# Patient Record
Sex: Female | Born: 1980 | Race: Black or African American | Hispanic: No | Marital: Married | Smoking: Never smoker
Health system: Southern US, Community
[De-identification: ages and names within clinical notes are randomized; demographics above are authoritative.]

## PROBLEM LIST (undated history)

## (undated) DIAGNOSIS — I10 Essential (primary) hypertension: Secondary | ICD-10-CM

## (undated) DIAGNOSIS — E119 Type 2 diabetes mellitus without complications: Secondary | ICD-10-CM

---

## 2010-04-21 ENCOUNTER — Emergency Department: Payer: Self-pay | Admitting: Unknown Physician Specialty

## 2010-10-25 ENCOUNTER — Emergency Department: Payer: Self-pay | Admitting: Emergency Medicine

## 2010-11-02 ENCOUNTER — Emergency Department: Payer: Self-pay | Admitting: Emergency Medicine

## 2011-05-10 ENCOUNTER — Emergency Department: Payer: Self-pay | Admitting: Emergency Medicine

## 2011-12-13 ENCOUNTER — Emergency Department: Payer: Self-pay | Admitting: Emergency Medicine

## 2011-12-13 LAB — CBC
HCT: 44 % (ref 35.0–47.0)
HGB: 14.3 g/dL (ref 12.0–16.0)
MCH: 28 pg (ref 26.0–34.0)
MCHC: 32.5 g/dL (ref 32.0–36.0)
MCV: 86 fL (ref 80–100)
RDW: 12.8 % (ref 11.5–14.5)

## 2011-12-13 LAB — URINALYSIS, COMPLETE
Bacteria: NEGATIVE
Glucose,UR: NEGATIVE mg/dL (ref 0–75)
Ketone: NEGATIVE
Leukocyte Esterase: NEGATIVE
Nitrite: NEGATIVE
Ph: 5 (ref 4.5–8.0)
Protein: NEGATIVE
RBC,UR: NONE SEEN /HPF (ref 0–5)

## 2011-12-13 LAB — ETHANOL: Ethanol %: 0.04 % (ref 0.000–0.080)

## 2011-12-13 LAB — COMPREHENSIVE METABOLIC PANEL
Alkaline Phosphatase: 60 U/L (ref 50–136)
Anion Gap: 11 (ref 7–16)
BUN: 7 mg/dL (ref 7–18)
Bilirubin,Total: 0.7 mg/dL (ref 0.2–1.0)
EGFR (Non-African Amer.): >60
Glucose: 78 mg/dL (ref 65–99)
Osmolality: 276 (ref 275–301)
Potassium: 3.5 mmol/L (ref 3.5–5.1)
SGPT (ALT): 22 U/L
Total Protein: 8.5 g/dL — ABNORMAL HIGH (ref 6.4–8.2)

## 2011-12-13 LAB — PREGNANCY, URINE: Pregnancy Test, Urine: NEGATIVE m[IU]/mL

## 2012-09-14 ENCOUNTER — Ambulatory Visit: Payer: Self-pay | Admitting: Obstetrics and Gynecology

## 2014-08-09 DIAGNOSIS — M222X2 Patellofemoral disorders, left knee: Secondary | ICD-10-CM

## 2014-08-09 DIAGNOSIS — M222X1 Patellofemoral disorders, right knee: Secondary | ICD-10-CM | POA: Insufficient documentation

## 2015-03-09 ENCOUNTER — Other Ambulatory Visit: Payer: Self-pay | Admitting: Physician Assistant

## 2015-03-09 DIAGNOSIS — R51 Headache: Principal | ICD-10-CM

## 2015-03-09 DIAGNOSIS — H539 Unspecified visual disturbance: Secondary | ICD-10-CM

## 2015-03-09 DIAGNOSIS — R519 Headache, unspecified: Secondary | ICD-10-CM

## 2015-03-14 ENCOUNTER — Ambulatory Visit: Payer: Self-pay

## 2015-12-12 ENCOUNTER — Encounter: Payer: Self-pay | Admitting: Physician Assistant

## 2015-12-12 ENCOUNTER — Ambulatory Visit: Payer: Self-pay | Admitting: Physician Assistant

## 2015-12-12 VITALS — BP 120/70 | HR 76 | Temp 98.5°F

## 2015-12-12 DIAGNOSIS — A084 Viral intestinal infection, unspecified: Secondary | ICD-10-CM

## 2015-12-12 NOTE — Progress Notes (Signed)
S:  Pt c/o nausea and diarrhea, sx for 3 days, some  fever/chills, no abd pain except for cramping with diarrhea; denies cp/sob, denies camping, bad food, recent antibiotics, or exposure to bad water, multiple episodes of diarrhea each day, now her son has same sx Remainder ros neg  O:  Vitals wnl, nad, ENT wnl, neck supple no lymph, lungs c t a, cv rrr, abd soft nontender bs increased lower quads b/l, neuro intact  A:  Viral gastroenteritis  P:  Reassurance, fluids, brat diet, immodium ad for diarrhea if needed, return if not better in 3 days, return earlier if worsening

## 2017-09-11 ENCOUNTER — Ambulatory Visit: Payer: BLUE CROSS/BLUE SHIELD | Admitting: Family Medicine

## 2017-09-11 ENCOUNTER — Other Ambulatory Visit: Payer: Self-pay | Admitting: Family Medicine

## 2017-09-11 ENCOUNTER — Encounter: Payer: Self-pay | Admitting: Family Medicine

## 2017-09-11 VITALS — BP 140/96 | HR 88 | Temp 98.2°F | Resp 18 | Ht 62.0 in | Wt 200.8 lb

## 2017-09-11 DIAGNOSIS — Z13220 Encounter for screening for lipoid disorders: Secondary | ICD-10-CM

## 2017-09-11 DIAGNOSIS — F320 Major depressive disorder, single episode, mild: Secondary | ICD-10-CM | POA: Diagnosis not present

## 2017-09-11 DIAGNOSIS — R0683 Snoring: Secondary | ICD-10-CM

## 2017-09-11 DIAGNOSIS — Z113 Encounter for screening for infections with a predominantly sexual mode of transmission: Secondary | ICD-10-CM

## 2017-09-11 DIAGNOSIS — F5104 Psychophysiologic insomnia: Secondary | ICD-10-CM | POA: Diagnosis not present

## 2017-09-11 DIAGNOSIS — Z131 Encounter for screening for diabetes mellitus: Secondary | ICD-10-CM

## 2017-09-11 DIAGNOSIS — N912 Amenorrhea, unspecified: Secondary | ICD-10-CM | POA: Diagnosis not present

## 2017-09-11 DIAGNOSIS — R51 Headache: Secondary | ICD-10-CM

## 2017-09-11 DIAGNOSIS — F411 Generalized anxiety disorder: Secondary | ICD-10-CM

## 2017-09-11 DIAGNOSIS — R0981 Nasal congestion: Secondary | ICD-10-CM

## 2017-09-11 DIAGNOSIS — R030 Elevated blood-pressure reading, without diagnosis of hypertension: Secondary | ICD-10-CM | POA: Diagnosis not present

## 2017-09-11 DIAGNOSIS — R519 Headache, unspecified: Secondary | ICD-10-CM

## 2017-09-11 DIAGNOSIS — R439 Unspecified disturbances of smell and taste: Secondary | ICD-10-CM

## 2017-09-11 DIAGNOSIS — Z832 Family history of diseases of the blood and blood-forming organs and certain disorders involving the immune mechanism: Secondary | ICD-10-CM

## 2017-09-11 LAB — POCT URINE PREGNANCY: Preg Test, Ur: NEGATIVE

## 2017-09-11 MED ORDER — ESCITALOPRAM OXALATE 10 MG PO TABS: 10.0000 mg | ORAL_TABLET | Freq: Every day | ORAL | 0 refills | Status: DC

## 2017-09-11 NOTE — Progress Notes (Signed)
Name: Bethany [name]   MRN: [id number]    DOB: 16-Nov-1980   Date:09/11/2017       Progress Note  Subjective  Chief Complaint  Chief Complaint  Patient presents with  . Establish Care    Would like blood work done and has been fasting today. Has been smelling cigarette smoke when no one smokes around her.  . Extremity Weakness    Left Hand-Couple of months and will drop items  . Leg Pain    Right Leg Pain for a couple months when standing all day and sitting for prolong periods of time.  Marland Kitchen. Headache    Onset-2 months, Right side of head has a history of alopecia.   . Snoring    Patient was told she snores in her sleep and SOB since she has gained weight.  . Amenorrhea    Has not has a cycle in 4 months and all pregnancy test have came back negative.  . Stress    HPI  Snoring: she states she snores at night, has interrupted sleep and feels tired during the day. She does not have time to take naps. She also has mental fogginess and headaches  Headache: going on for past 3 months, nagging, aching, left side of head, no nausea , vomiting or photophobia. She has also noticed a distorted smell. States she can always smells cigarette smell, even when not around it. Going on for months ago. No rhinorrhea, has nasal congestion at times. Not exactly sure if symptoms started with onset of headache or not  Obesity: she has noticed weight gain  Depression/Anxiety: [place]. She moved wit her husband and two children to protect them from their environment in [place] ( violence/drugs). She states husband was incarcerated for robbery back in 2014. He is from [place] and is no a legal resident. She is worried that he will be deported. She is now a single mom and is having problems with her daughter. She goes to church but not feel very close to anyone. She does not feel like she can move back to [place] - for children's safety. She has crying spells, disrupted sleep,  feels tired all the time. Denies suicidal thoughts or ideation  Amenorrhea: she has gained weight, she had one sexual intercourse over the past 6 months and no cycle in the past 4 months, she states no vaginal discharge, no nipple discharge. Positive for headaches. Home pregnancy test negative  We did not discuss other problems because the visit went too long, she will return to address left hand symptoms.  Patient Active Problem List   Diagnosis Date Noted  . 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 Patellofemoral stress syndrome of both knees 08/09/2014    Past Surgical History:  Procedure Laterality Date  . CESAREAN SECTION  2002  . CESAREAN SECTION  2005    Family History  Problem Relation Age of Onset  . Emphysema Mother   . Hypercalcemia Mother   . Hypertension Mother   . Sickle cell trait Mother   . Clotting disorder Mother   . Prostate cancer Father   . Diabetes Mellitus II Father   . Hypertension Father   . Heart Problems Father        Enlarged Heart  . Thyroid disease Sister   . Asthma Paternal Grandmother   . Lung cancer Maternal Uncle   . Lung cancer Maternal Aunt   . Kidney disease Paternal Aunt  Social History   Socioeconomic History  . Marital status: Married    Spouse name: Casimiro Needle   . Number of children: 2  . Years of education: Not on file  . Highest education level: Some college, no degree  Social Needs  . Financial resource strain: Somewhat hard  . Food insecurity - worry: Never true  . Food insecurity - inability: Never true  . Transportation needs - medical: No  . Transportation needs - non-medical: No  Occupational History  . Occupation: food service    Comment: at [employer]  . Smoking status: Never Smoker  . Smokeless tobacco: Never Used  Substance and Sexual Activity  . Alcohol use: Yes    Alcohol/week: 0.0 oz    Comment: occasionally  . Drug use: No  . Sexual activity: Yes    Partners: Male    Birth  control/protection: None  Other Topics Concern  . Not on file  Social History Narrative   Married, has two children at home, her relatives live [place]   Husband incarcerated since 2014 for robbery, he is from [place] and may be deported next year when he gets out.    Works at [employer]    She moved to [place] 2011 for a safer place to raise her children     No current outpatient medications on file.  No Known Allergies   ROS  Constitutional: Negative for fever or weight change.  Respiratory: Negative for cough and shortness of breath.   Cardiovascular: Negative for chest pain or palpitations.  Gastrointestinal: Negative for abdominal pain, no bowel changes.  Musculoskeletal: Negative for gait problem or joint swelling.  Skin: Negative for rash.  Neurological: Negative for dizziness, positive for  headache.  No other specific complaints in a complete review of systems (except as listed in HPI above).  Objective  Vitals:   09/11/17 1036 09/11/17 1113  BP: (!) 158/96 (!) 140/96  Pulse: 88   Resp: 18   Temp: 98.2 F (36.8 C)   TempSrc: Oral   SpO2: 99%   Weight: 200 lb 12.8 oz (91.1 kg)   Height: 5\' 2"  (1.575 m)     Body mass index is 36.73 kg/m.  Physical Exam  Constitutional: Patient appears well-developed and well-nourished. Obese  No distress.  HEENT: head atraumatic, normocephalic, pupils equal and reactive to light, ears normal TM bilaterally, no nystagmus, neck supple, throat within normal limits Cardiovascular: Normal rate, regular rhythm and normal heart sounds.  No murmur heard. No BLE edema. Pulmonary/Chest: Effort normal and breath sounds normal. No respiratory distress. Abdominal: Soft.  There is no tenderness. Psychiatric: Patient has a normal mood and affect. behavior is normal. Judgment and thought content normal. Neurological exam: normal , no focal findings.    PHQ2/9: Depression screen PHQ 2/9 09/11/2017  Decreased Interest 1  Down, Depressed,  Hopeless 1  PHQ - 2 Score 2  Altered sleeping 2  Tired, decreased energy 3  Change in appetite 3  Feeling bad or failure about yourself  1  Trouble concentrating 2  Moving slowly or fidgety/restless 0  Suicidal thoughts 0  PHQ-9 Score 13  Difficult doing work/chores Somewhat difficult     Fall Risk: Fall Risk  09/11/2017  Falls in the past year? No     Functional Status Survey: Is the patient deaf or have difficulty hearing?: No Does the patient have difficulty seeing, even when wearing glasses/contacts?: No Does the patient have difficulty concentrating, remembering, or making decisions?: No  Does the patient have difficulty walking or climbing stairs?: No Does the patient have difficulty dressing or bathing?: No Does the patient have difficulty doing errands alone such as visiting a doctor's office or shopping?: No    Assessment & Plan  1. Elevated blood pressure reading  She will return in a couple of weeks, first visit in our office, she was nervous, states usually normal bp  - COMPLETE METABOLIC PANEL WITH GFR - CBC with Differential/Platelet - TSH  2. Psychophysiological insomnia  We will start by treating depression anxiety, also discussed sleep hygiene  3. Mild major depression (HCC)  Discussed counseling and she will consider it, currently taking her daughter to counseling. Explained that she needs to care for herself also. Be more involved in church ( make friends) ,seems lonely and no family support in this area.  - escitalopram (LEXAPRO) 10 MG tablet; Take 1 tablet (10 mg total) by mouth daily.  Dispense: 30 tablet; Refill: 0  4. GAD (generalized anxiety disorder)  - escitalopram (LEXAPRO) 10 MG tablet; Take 1 tablet (10 mg total) by mouth daily.  Dispense: 30 tablet; Refill: 0  5. Routine screening for STI (sexually transmitted infection)  - HIV antibody - RPR - Hepatitis, Acute - C. trachomatis/N. gonorrhoeae RNA  6. Family history of sickle cell  trait  - Sickle Cell Scr  7. Diabetes mellitus screening  - Hemoglobin A1c  8. Lipid screening  - Lipid panel  9. Amenorrhea  - POCT urine pregnancy - Prolactin  10. Snoring  Referral sleep study   11. Nasal congestion  - fluticasone (FLONASE) 50 MCG/ACT nasal spray; Place 2 sprays into both nostrils daily.  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 Left-sided headache  We will refer her to ENT first, may need CT brain or referral to neurologist if symptoms not secondary to ENT cause. Normal neurological exam today

## 2017-09-12 LAB — C. TRACHOMATIS/N. GONORRHOEAE RNA
C. TRACHOMATIS RNA, TMA: NOT DETECTED
N. GONORRHOEAE RNA, TMA: NOT DETECTED

## 2017-09-13 MED ORDER — FLUTICASONE PROPIONATE 50 MCG/ACT NA SUSP
2.0000 | Freq: Every day | NASAL | 6 refills | Status: DC
Start: 2017-09-13 — End: 2019-01-19

## 2017-09-14 LAB — COMPLETE METABOLIC PANEL WITH GFR
AG RATIO: 1.4 (calc) (ref 1.0–2.5)
ALT: 18 U/L (ref 6–29)
AST: 18 U/L (ref 10–30)
Albumin: 4.2 g/dL (ref 3.6–5.1)
Alkaline phosphatase (APISO): 52 U/L (ref 33–115)
BUN: 9 mg/dL (ref 7–25)
CO2: 26 mmol/L (ref 20–32)
Calcium: 9.3 mg/dL (ref 8.6–10.2)
Chloride: 107 mmol/L (ref 98–110)
Creat: 0.85 mg/dL (ref 0.50–1.10)
GFR, Est African American: 101 mL/min/{1.73_m2} (ref >=60)
GFR, Est Non African American: 87 mL/min/{1.73_m2} (ref >=60)
GLUCOSE: 89 mg/dL (ref 65–99)
Globulin: 3 g/dL (calc) (ref 1.9–3.7)
POTASSIUM: 3.9 mmol/L (ref 3.5–5.3)
Sodium: 138 mmol/L (ref 135–146)
Total Bilirubin: 0.5 mg/dL (ref 0.2–1.2)
Total Protein: 7.2 g/dL (ref 6.1–8.1)

## 2017-09-14 LAB — CBC WITH DIFFERENTIAL/PLATELET
BASOS PCT: 0.5 %
Basophils Absolute: 19 cells/uL (ref 0–200)
Eosinophils Absolute: 70 cells/uL (ref 15–500)
Eosinophils Relative: 1.9 %
HEMATOCRIT: 39.9 % (ref 35.0–45.0)
HEMOGLOBIN: 13.5 g/dL (ref 11.7–15.5)
LYMPHS ABS: 1635 {cells}/uL (ref 850–3900)
MCH: 27.2 pg (ref 27.0–33.0)
MCHC: 33.8 g/dL (ref 32.0–36.0)
MCV: 80.4 fL (ref 80.0–100.0)
MPV: 10.1 fL (ref 7.5–12.5)
Monocytes Relative: 7.5 %
NEUTROS ABS: 1698 {cells}/uL (ref 1500–7800)
Neutrophils Relative %: 45.9 %
Platelets: 235 10*3/uL (ref 140–400)
RBC: 4.96 10*6/uL (ref 3.80–5.10)
RDW: 13 % (ref 11.0–15.0)
TOTAL LYMPHOCYTE: 44.2 %
WBC: 3.7 10*3/uL — ABNORMAL LOW (ref 3.8–10.8)
WBCMIX: 278 {cells}/uL (ref 200–950)

## 2017-09-14 LAB — HEPATITIS PANEL, ACUTE
Hep A IgM: NONREACTIVE
Hep B C IgM: NONREACTIVE
Hepatitis B Surface Ag: NONREACTIVE
Hepatitis C Ab: NONREACTIVE
SIGNAL TO CUT-OFF: 0.03 (ref <1.00)

## 2017-09-14 LAB — SICKLE CELL SCREEN: Sickle Solubility Test - HGBRFX: NEGATIVE

## 2017-09-14 LAB — PROLACTIN: PROLACTIN: 10.4 ng/mL

## 2017-09-14 LAB — LIPID PANEL
CHOLESTEROL: 151 mg/dL (ref <200)
HDL: 60 mg/dL (ref >50)
LDL CHOLESTEROL (CALC): 73 mg/dL
Non-HDL Cholesterol (Calc): 91 mg/dL (calc) (ref <130)
Total CHOL/HDL Ratio: 2.5 (calc) (ref <5.0)
Triglycerides: 98 mg/dL (ref <150)

## 2017-09-14 LAB — RPR: RPR Ser Ql: NONREACTIVE

## 2017-09-14 LAB — HEMOGLOBIN A1C
HEMOGLOBIN A1C: 5.7 %{Hb} — AB (ref <5.7)
Mean Plasma Glucose: 117 (calc)
eAG (mmol/L): 6.5 (calc)

## 2017-09-14 LAB — TSH: TSH: 1.47 mIU/L

## 2017-09-14 LAB — HIV ANTIBODY (ROUTINE TESTING W REFLEX): HIV 1&2 Ab, 4th Generation: NONREACTIVE

## 2017-09-17 ENCOUNTER — Telehealth: Payer: Self-pay

## 2017-09-17 NOTE — Telephone Encounter (Signed)
Patient notified we need to wait until she sees [facility].

## 2017-09-17 NOTE — Telephone Encounter (Signed)
Did you discuss patient having a CT while in the office? There is no order and wanted to inquire does she need one?

## 2017-09-17 NOTE — Telephone Encounter (Signed)
Copied from CRM [phone]. Topic: Referral - Question >> Sep 16, 2017  4:42 PM Jolayne Hainesaylor, Brittany L wrote: Patient wants to know will she be getting the CT scan & also can you see if she can get that and the ENT on the same day. The ENT appt is on feb 22.  Patients call back is [phone]

## 2017-09-17 NOTE — Telephone Encounter (Signed)
She needs to see them first, they may order MRI instead of CT

## 2017-10-02 ENCOUNTER — Other Ambulatory Visit: Payer: Self-pay | Admitting: Otolaryngology

## 2017-10-02 DIAGNOSIS — R519 Headache, unspecified: Secondary | ICD-10-CM

## 2017-10-02 DIAGNOSIS — J343 Hypertrophy of nasal turbinates: Secondary | ICD-10-CM | POA: Diagnosis not present

## 2017-10-02 DIAGNOSIS — J301 Allergic rhinitis due to pollen: Secondary | ICD-10-CM | POA: Diagnosis not present

## 2017-10-02 DIAGNOSIS — R51 Headache: Secondary | ICD-10-CM

## 2017-10-02 DIAGNOSIS — R438 Other disturbances of smell and taste: Secondary | ICD-10-CM

## 2017-10-02 DIAGNOSIS — R430 Anosmia: Secondary | ICD-10-CM | POA: Diagnosis not present

## 2017-10-05 ENCOUNTER — Ambulatory Visit: Payer: BLUE CROSS/BLUE SHIELD | Admitting: Family Medicine

## 2017-10-05 ENCOUNTER — Encounter: Payer: Self-pay | Admitting: Family Medicine

## 2017-10-05 DIAGNOSIS — Z23 Encounter for immunization: Secondary | ICD-10-CM | POA: Diagnosis not present

## 2017-10-05 DIAGNOSIS — R439 Unspecified disturbances of smell and taste: Secondary | ICD-10-CM | POA: Diagnosis not present

## 2017-10-05 DIAGNOSIS — E669 Obesity, unspecified: Secondary | ICD-10-CM | POA: Diagnosis not present

## 2017-10-05 DIAGNOSIS — R0981 Nasal congestion: Secondary | ICD-10-CM | POA: Diagnosis not present

## 2017-10-05 DIAGNOSIS — N912 Amenorrhea, unspecified: Secondary | ICD-10-CM

## 2017-10-05 DIAGNOSIS — R51 Headache: Secondary | ICD-10-CM | POA: Diagnosis not present

## 2017-10-05 DIAGNOSIS — R7303 Prediabetes: Secondary | ICD-10-CM

## 2017-10-05 DIAGNOSIS — M545 Low back pain, unspecified: Secondary | ICD-10-CM

## 2017-10-05 DIAGNOSIS — F411 Generalized anxiety disorder: Secondary | ICD-10-CM

## 2017-10-05 DIAGNOSIS — F320 Major depressive disorder, single episode, mild: Secondary | ICD-10-CM

## 2017-10-05 DIAGNOSIS — R519 Headache, unspecified: Secondary | ICD-10-CM

## 2017-10-05 LAB — POCT URINALYSIS DIPSTICK
Appearance: NORMAL
BILIRUBIN UA: NEGATIVE
Blood, UA: NEGATIVE
GLUCOSE UA: NEGATIVE
KETONES UA: NEGATIVE
Leukocytes, UA: NEGATIVE
Nitrite, UA: NEGATIVE
PROTEIN UA: NEGATIVE
SPEC GRAV UA: 1.015 (ref 1.010–1.025)
Urobilinogen, UA: NEGATIVE E.U./dL — AB
pH, Initial: 6

## 2017-10-05 MED ORDER — BACLOFEN 10 MG PO TABS
10.0000 mg | ORAL_TABLET | Freq: Every day | ORAL | 0 refills | Status: DC
Start: 2017-10-05 — End: 2019-01-19

## 2017-10-05 NOTE — Progress Notes (Signed)
Name: Bethany [name]   MRN: [id number]    DOB: 1980-10-08   Date:10/05/2017       Progress Note  Subjective  Chief Complaint  Chief Complaint  Patient presents with  . Follow-up    3 week F/U  . Depression    Started Lexapro last visit  . Hypertension  . Back Pain    lower back pain x 2 days intermittent when she coughs or laughs.    HPI  Depression/GAD: she filled rx but had to go home to see mother [facility]) and forgot to start taking it. Her mother was just released from hospital ( emphysema) and is shipping the medication to her. She continues to feel stressed, and overwhelmed. She states she stopped taking son to [facility] for music practice weekly, she has not been able to take care of herself yet, but is trying to get more sleep ( a total of 6 hours per night). She states crying spells are less often. Only cries when her kids cry.   Elevated BP: resolved since last visit.   Headache : resolved  Nasal congestion and smell disorder: seen by ENT and is having MRI done, she is now on singulair and Astelin also. Seen by Dr. Willeen Cass   Back pain: she had an URI last week and she now has a pain on left mid back when she coughs or laughs.   Amenorrhea: normal TSH, prolactin. She would like to see gyn  Pre-diabetes: she denies polyphagia, polyuria or polydipsia , hgbA1C 5.7. She would like to see dietician. Discussed GLP-1 agonist - she denies personal history of pancreatitis or family history of thyroid cancer.    Patient Active Problem List   Diagnosis Date Noted  . 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 Patellofemoral stress syndrome of both knees 08/09/2014    Past Surgical History:  Procedure Laterality Date  . CESAREAN SECTION  2002  . CESAREAN SECTION  2005    Family History  Problem Relation Age of Onset  . Emphysema Mother   . Hypercalcemia Mother   . Hypertension Mother   . Sickle cell trait Mother   . Clotting disorder Mother   . Prostate cancer Father    . Diabetes Mellitus II Father   . Hypertension Father   . Heart Problems Father        Enlarged Heart  . Thyroid disease Sister   . Asthma Paternal Grandmother   . Lung cancer Maternal Uncle   . Lung cancer Maternal Aunt   . Kidney disease Paternal Aunt     Social History   Socioeconomic History  . Marital status: Married    Spouse name: Casimiro Needle   . Number of children: 2  . Years of education: Not on file  . Highest education level: Some college, no degree  Social Needs  . Financial resource strain: Somewhat hard  . Food insecurity - worry: Never true  . Food insecurity - inability: Never true  . Transportation needs - medical: No  . Transportation needs - non-medical: No  Occupational History  . Occupation: food service    Comment: at [employer]  . Smoking status: Never Smoker  . Smokeless tobacco: Never Used  Substance and Sexual Activity  . Alcohol use: Yes    Alcohol/week: 0.0 oz    Comment: occasionally  . Drug use: No  . Sexual activity: Yes    Partners: Male    Birth control/protection: None  Other Topics Concern  .  Not on file  Social History Narrative   Married, has two children at home, her relatives live [place]   Husband incarcerated since 2014 for robbery, he is from [place] and may be deported next year when he gets out.    Works at [employer]    She moved to [place] 2011 for a safer place to raise her children      Current Outpatient Medications:  .  azelastine (ASTELIN) 0.1 % nasal spray, , Disp: , Rfl: 0 .  fluticasone (FLONASE) 50 MCG/ACT nasal spray, Place 2 sprays into both nostrils daily., Disp: 16 g, Rfl: 6 .  montelukast (SINGULAIR) 10 MG tablet, , Disp: , Rfl: 0 .  escitalopram (LEXAPRO) 10 MG tablet, Take 1 tablet (10 mg total) by mouth daily. (Patient not taking: Reported on 10/05/2017), Disp: 30 tablet, Rfl: 0  No Known Allergies   ROS  Constitutional: Negative for fever or weight change.  Respiratory: Negative  for cough and shortness of breath.   Cardiovascular: Negative for chest pain or palpitations.  Gastrointestinal: Negative for abdominal pain, no bowel changes.  Musculoskeletal: Negative for gait problem or joint swelling.  Skin: Negative for rash.  Neurological: Negative for dizziness or headache.  No other specific complaints in a complete review of systems (except as listed in HPI above).  Objective  Vitals:   10/05/17 1352  BP: 106/80  Pulse: 83  Resp: 14  SpO2: 99%  Weight: 201 lb (91.2 kg)  Height: 5\' 2"  (1.575 m)    Body mass index is 36.76 kg/m.  Physical Exam  Constitutional: Patient appears well-developed and well-nourished. Obese  No distress.  HEENT: head atraumatic, normocephalic, pupils equal and reactive to light, neck supple, throat within normal limits, boggy turbinates Cardiovascular: Normal rate, regular rhythm and normal heart sounds.  No murmur heard. No BLE edema. Pulmonary/Chest: Effort normal and breath sounds normal. No respiratory distress. Abdominal: Soft.  There is no tenderness. Psychiatric: Patient has a normal mood and affect. behavior is normal. Judgment and thought content normal.   Recent Results (from the past 2160 hour(s))  COMPLETE METABOLIC PANEL WITH GFR     Status: None   Collection Time: 09/11/17 11:56 AM  Result Value Ref Range   Glucose, Bld 89 65 - 99 mg/dL    Comment: .            Fasting reference interval .    BUN 9 7 - 25 mg/dL   Creat 1.61 0.96 - 0.45 mg/dL   GFR, Est Non African American 87 > OR = 60 mL/min/1.3m2   GFR, Est African American 101 > OR = 60 mL/min/1.65m2   BUN/Creatinine Ratio NOT APPLICABLE 6 - 22 (calc)   Sodium 138 135 - 146 mmol/L   Potassium 3.9 3.5 - 5.3 mmol/L   Chloride 107 98 - 110 mmol/L   CO2 26 20 - 32 mmol/L   Calcium 9.3 8.6 - 10.2 mg/dL   Total Protein 7.2 6.1 - 8.1 g/dL   Albumin 4.2 3.6 - 5.1 g/dL   Globulin 3.0 1.9 - 3.7 g/dL (calc)   AG Ratio 1.4 1.0 - 2.5 (calc)   Total  Bilirubin 0.5 0.2 - 1.2 mg/dL   Alkaline phosphatase (APISO) 52 33 - 115 U/L   AST 18 10 - 30 U/L   ALT 18 6 - 29 U/L  Hemoglobin A1c     Status: Abnormal   Collection Time: 09/11/17 11:56 AM  Result Value Ref Range   Hgb A1c MFr Bld  5.7 (H) <5.7 % of total Hgb    Comment: For someone without known diabetes, a hemoglobin  A1c value between 5.7% and 6.4% is consistent with prediabetes and should be confirmed with a  follow-up test. . For someone with known diabetes, a value <7% indicates that their diabetes is well controlled. A1c targets should be individualized based on duration of diabetes, age, comorbid conditions, and other considerations. . This assay result is consistent with an increased risk of diabetes. . Currently, no consensus exists regarding use of hemoglobin A1c for diagnosis of diabetes for children. .    Mean Plasma Glucose 117 (calc)   eAG (mmol/L) 6.5 (calc)  HIV antibody     Status: None   Collection Time: 09/11/17 11:56 AM  Result Value Ref Range   HIV 1&2 Ab, 4th Generation NON-REACTIVE NON-REACTI    Comment: HIV-1 antigen and HIV-1/HIV-2 antibodies were not detected. There is no laboratory evidence of HIV infection. Marland Kitchen PLEASE NOTE: This information has been disclosed to you from records whose confidentiality may be protected by state law.  If your state requires such protection, then the state law prohibits you from making any further disclosure of the information without the specific written consent of the person to whom it pertains, or as otherwise permitted by law. A general authorization for the release of medical or other information is NOT sufficient for this purpose. . For additional information please refer to http://education.[facility].com/faq/FAQ106 (This link is being provided for informational/ educational purposes only.) . Marland Kitchen The performance of this assay has not been clinically validated in patients less than 2 years  old. .   Lipid panel     Status: None   Collection Time: 09/11/17 11:56 AM  Result Value Ref Range   Cholesterol 151 <200 mg/dL   HDL 60 >16 mg/dL   Triglycerides 98 <109 mg/dL   LDL Cholesterol (Calc) 73 mg/dL (calc)    Comment: Reference range: <100 . Desirable range <100 mg/dL for primary prevention;   <70 mg/dL for patients with CHD or diabetic patients  with > or = 2 CHD risk factors. Marland Kitchen LDL-C is now calculated using the Martin-Hopkins  calculation, which is a validated novel method providing  better accuracy than the Friedewald equation in the  estimation of LDL-C.  Horald Pollen et al. Lenox Ahr. 6045;409(81): 2061-2068  (http://education.[facility].com/faq/FAQ164)    Total CHOL/HDL Ratio 2.5 <5.0 (calc)   Non-HDL Cholesterol (Calc) 91 <191 mg/dL (calc)    Comment: For patients with diabetes plus 1 major ASCVD risk  factor, treating to a non-HDL-C goal of <100 mg/dL  (LDL-C of <47 mg/dL) is considered a therapeutic  option.   CBC with Differential/Platelet     Status: Abnormal   Collection Time: 09/11/17 11:56 AM  Result Value Ref Range   WBC 3.7 (L) 3.8 - 10.8 Thousand/uL   RBC 4.96 3.80 - 5.10 Million/uL   Hemoglobin 13.5 11.7 - 15.5 g/dL   HCT 82.9 56.2 - 13.0 %   MCV 80.4 80.0 - 100.0 fL   MCH 27.2 27.0 - 33.0 pg   MCHC 33.8 32.0 - 36.0 g/dL   RDW 86.5 78.4 - 69.6 %   Platelets 235 140 - 400 Thousand/uL   MPV 10.1 7.5 - 12.5 fL   Neutro Abs 1,698 1,500 - 7,800 cells/uL   Lymphs Abs 1,635 850 - 3,900 cells/uL   WBC mixed population 278 200 - 950 cells/uL   Eosinophils Absolute 70 15 - 500 cells/uL   Basophils Absolute 19 0 -  200 cells/uL   Neutrophils Relative % 45.9 %   Total Lymphocyte 44.2 %   Monocytes Relative 7.5 %   Eosinophils Relative 1.9 %   Basophils Relative 0.5 %  RPR     Status: None   Collection Time: 09/11/17 11:56 AM  Result Value Ref Range   RPR Ser Ql NON-REACTIVE NON-REACTI  Hepatitis, Acute     Status: None   Collection Time:  09/11/17 11:56 AM  Result Value Ref Range   Hep A IgM NON-REACTIVE NON-REACTI   Hepatitis B Surface Ag NON-REACTIVE NON-REACTI   Hep B C IgM NON-REACTIVE NON-REACTI   Hepatitis C Ab NON-REACTIVE NON-REACTI   SIGNAL TO CUT-OFF 0.03 <1.00  Sickle Cell Scr     Status: None   Collection Time: 09/11/17 11:56 AM  Result Value Ref Range   Sickle Solubility Test - HGBRFX NEGATIVE NEGATIVE    Comment: . Hemoglobin solubility testing alone is insufficient for detecting or confirming the presence of sickling hemoglobins in some situations. Additional testing may be required for diagnosis of hemoglobinopathies. For more information on this test go to: http://education.[facility].com/faq/FAQ99v1 .   TSH     Status: None   Collection Time: 09/11/17 11:56 AM  Result Value Ref Range   TSH 1.47 mIU/L    Comment:           Reference Range .           > or = 20 Years  0.40-4.50 .                Pregnancy Ranges           First trimester    0.26-2.66           Second trimester   0.55-2.73           Third trimester    0.43-2.91   Prolactin     Status: None   Collection Time: 09/11/17 11:56 AM  Result Value Ref Range   Prolactin 10.4 ng/mL    Comment:             Reference Range  Females         Non-pregnant        3.0-30.0         Pregnant           10.0-209.0         Postmenopausal      2.0-20.0 . . .   POCT urine pregnancy     Status: Normal   Collection Time: 09/11/17 11:57 AM  Result Value Ref Range   Preg Test, Ur Negative Negative  C. trachomatis/N. gonorrhoeae RNA     Status: None   Collection Time: 09/11/17  2:36 PM  Result Value Ref Range   C. trachomatis RNA, TMA NOT DETECTED NOT DETECT   N. gonorrhoeae RNA, TMA NOT DETECTED NOT DETECT    Comment: This test was performed using the APTIMA COMBO2 Assay ([employer] . The analytical performance characteristics of this  assay, when used to test SurePath specimens have been determined by [facility]. .        PHQ2/9: Depression screen [facility] 10/05/2017 09/11/2017  Decreased Interest 1 1  Down, Depressed, Hopeless 1 1  PHQ - 2 Score 2 2  Altered sleeping 3 2  Tired, decreased energy 3 3  Change in appetite 3 3  Feeling bad or failure about yourself  2 1  Trouble concentrating 2 2  Moving slowly or fidgety/restless 0 0  Suicidal thoughts 0 0  PHQ-9 Score 15 13  Difficult doing work/chores Somewhat difficult Somewhat difficult     Fall Risk: Fall Risk  10/05/2017 09/11/2017  Falls in the past year? No No     Functional Status Survey: Is the patient deaf or have difficulty hearing?: No Does the patient have difficulty seeing, even when wearing glasses/contacts?: No Does the patient have difficulty concentrating, remembering, or making decisions?: No Does the patient have difficulty walking or climbing stairs?: No Does the patient have difficulty dressing or bathing?: No Does the patient have difficulty doing errands alone such as visiting a doctor's office or shopping?: No    Assessment & Plan  1. Acute midline low back pain without sciatica  - POCT Urinalysis Dipstick Likely muscle spasm , she states she had a cough, only when she laughs and coughs. Discussed muscle relaxer.   2. GAD (generalized anxiety disorder)  Still anxious, left medication at mother's house and is getting shipped back to her, she never started medication but is wiling to try   3. Need for Tdap vaccination  - Tdap today   4. Nasal congestion  On Astelin and Flonase, also on singulair and seeing ENT   5. Mild major depression (HCC)  She has been   6. Disorders of smell  Seeing ENT and was given Astelin and will have MRI brain because she failed the smell test.   7. Left-sided headache  Resolved since last visit   8. Amenorrhea  - Ambulatory referral to Obstetrics / Gynecology  9. Pre-diabetes  - Amb ref to Medical Nutrition Therapy-MNT  10. Obesity (BMI 30.0-34.9)  - Amb ref  to Medical Nutrition Therapy-MNT

## 2017-10-08 ENCOUNTER — Telehealth: Payer: Self-pay | Admitting: Obstetrics & Gynecology

## 2017-10-08 NOTE — Telephone Encounter (Signed)
[facility] referring for  Amenorrhea. Left voice mail for patient to call back to be schedule

## 2017-10-09 NOTE — Telephone Encounter (Signed)
Left voicemail for patient to call back to be schedule °

## 2017-10-12 ENCOUNTER — Ambulatory Visit
Admission: RE | Admit: 2017-10-12 | Discharge: 2017-10-12 | Disposition: A | Discharge status: Home / Self Care | Payer: BLUE CROSS/BLUE SHIELD | Source: Ambulatory Visit | Attending: Otolaryngology | Admitting: Otolaryngology

## 2017-10-12 DIAGNOSIS — R51 Headache: Secondary | ICD-10-CM | POA: Insufficient documentation

## 2017-10-12 DIAGNOSIS — E236 Other disorders of pituitary gland: Secondary | ICD-10-CM | POA: Diagnosis not present

## 2017-10-12 DIAGNOSIS — R438 Other disturbances of smell and taste: Secondary | ICD-10-CM | POA: Diagnosis not present

## 2017-10-12 DIAGNOSIS — R519 Headache, unspecified: Secondary | ICD-10-CM

## 2017-10-12 MED ORDER — GADOBENATE DIMEGLUMINE 529 MG/ML IV SOLN
20.0000 mL | Freq: Once | INTRAVENOUS | Status: AC | PRN
  Administered 2017-10-12: 18 mL via INTRAVENOUS

## 2017-11-04 ENCOUNTER — Encounter: Payer: BLUE CROSS/BLUE SHIELD | Attending: Family Medicine | Admitting: Dietician

## 2017-11-04 ENCOUNTER — Encounter: Payer: Self-pay | Admitting: Dietician

## 2017-11-04 VITALS — Ht 60.0 in | Wt 201.5 lb

## 2017-11-04 DIAGNOSIS — Z6839 Body mass index (BMI) 39.0-39.9, adult: Secondary | ICD-10-CM

## 2017-11-04 DIAGNOSIS — R7303 Prediabetes: Secondary | ICD-10-CM | POA: Diagnosis not present

## 2017-11-04 DIAGNOSIS — E6609 Other obesity due to excess calories: Secondary | ICD-10-CM

## 2017-11-04 NOTE — Progress Notes (Signed)
Medical Nutrition Therapy: Visit start time: 1330  end time: 1430  Assessment:  Diagnosis: prediabetes, obesity Past medical history: anxiety, depression Psychosocial issues/ stress concerns: dx of depression, reports high amounts of stress in her life currently Preferred learning method:  . Hands-on  Current weight: 201.5lb  Height: 5' Medications, supplements: MVI  Progress and evaluation: Patient has recently been diagnosed with intracranial hypertension. She feels that weight loss will help with this condition, along with helping to prevent diabetes. She does not usually get the recommended nightly hours of sleep and feels highly stressed often. States she has started to make changes to her diet but finds it challenging working a job where she is constantly around food [employer]) and when her children do not like some of the healthier foods she has been preparing. Diet changes to date are as follows: does not eat pork, no fried foods, no breakfast meats, switching to low/no calorie beverages, choosing frozen or fresh vegetables rather than canned, and trying to choose a protein + vegetable + starch at meal times. Traditionally her diet has been high in starches and sweets, particularly pasta and cookies. 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 D/t picking up a second job she has essentially stopped cooking at home and relies on fast food for her dinner meals. Goal wt 135#; last at goal wt 9435yrs ago.   Physical activity: She is on her feet constantly at work. Recently signed up for a gym membership but has not gone yet.  Dietary Intake:  Usual eating pattern includes 2-3 meals and 1-3 snacks per day. Dining out frequency: 3 meals per week.  Breakfast: not usually, eggs with cheese + grits + english muffin, smoothie, yogurt, fruit Snack: n/a Lunch: rice, pasta,  baked chicken, brussel sprouts, green beans, leafy greens Snack: granola, cookies, chocolate cake, chocolate candy, popcorn Supper: [employer] apple pecan chicken salad, [employer] fries + 1 taco, daughter brings home food from [employer] frequently Snack: n/a Beverages: water, Bai drinks  Nutrition Care Education: Topics covered: types of fats, plate method, portion control methods, parameters of healthy weight loss in women, stress and weight gain, eating out, easy make-ahead recipes, fiber and heart healthy fats and satiety Basic nutrition: basic food groups, appropriate nutrient balance, appropriate meal and snack schedule, general nutrition guidelines    Weight control: benefits of weight control, identifying healthy weight, determining reasonable weight goal, behavioral changes for weight loss Advanced nutrition: cooking techniques, dining out, food label reading Other lifestyle changes: benefits of making changes, increasing motivation, readiness for change, identifying habits that need to change  Nutritional Diagnosis:  [place]-3.3 Overweight/obesity As related to dietary habits.  As evidenced by BMI 39.35.  Intervention: Discussion as noted above. Patient expresses an understanding of parameters discussed to help her prevent diabetes. Her main focuses will be portion control, alternative snack options, and eating out less frequently. She will continue to be active and is considering using her new gym membership to further increase her activity level.  Education Materials given:  . Healthy Habits to Prevent Diabetes handout . Goals/ instructions  Learner/ who was taught:  . Patient  Level of understanding: . Partial understanding; needs review/ practice  Demonstrated degree of understanding via:   Teach back Learning barriers: . None  Willingness to learn/ readiness for change: . Eager, change in progress  Monitoring and Evaluation:  Dietary intake, exercise, and body weight       follow up: prn: she would like to go to her neurologist (April 4th) and see what they recommend before scheduling a follow up. Office number provided.

## 2017-11-04 NOTE — Patient Instructions (Addendum)
   Focus on the plate method at meal times. If you feel that you need a second helping, go back for more vegetables and protein instead. Also, try eating your vegetable and protein before eating the starch at a meal  Find easy recipes that you and your children can enjoy. For example, crock pot chicken (chicken breasts, salsa, taco seasoning) which you can make into tacos, burrito bowls, or eat as-is. Have meals like this prepared ahead of time so that when you get off work late you will be less tempted to go out to a fast food location  If out to eat, focus on including a non-fried protein, a vegetable, and a moderate to small portion of starch/ carbohydrate  Include heart-healthy fats in moderation  Healthy rate of weight loss: 1/2 # - 2 # of body weight per week  150min of physical activity per week is recommended for preventing diabetes  Look for substitutes for high-sugar snacks. For example, fruit and dark chocolate, a small amount of trail mix, [place] yogurt, popcorn, peanut butter + apple or banana

## 2017-11-05 ENCOUNTER — Ambulatory Visit (INDEPENDENT_AMBULATORY_CARE_PROVIDER_SITE_OTHER): Payer: BLUE CROSS/BLUE SHIELD | Admitting: Obstetrics and Gynecology

## 2017-11-05 ENCOUNTER — Encounter: Payer: Self-pay | Admitting: Family Medicine

## 2017-11-05 ENCOUNTER — Encounter: Payer: Self-pay | Admitting: Obstetrics and Gynecology

## 2017-11-05 ENCOUNTER — Other Ambulatory Visit: Payer: Self-pay | Admitting: Family Medicine

## 2017-11-05 VITALS — BP 134/78 | Ht 60.0 in | Wt 204.0 lb

## 2017-11-05 DIAGNOSIS — R9089 Other abnormal findings on diagnostic imaging of central nervous system: Secondary | ICD-10-CM | POA: Insufficient documentation

## 2017-11-05 DIAGNOSIS — Z0000 Encounter for general adult medical examination without abnormal findings: Secondary | ICD-10-CM | POA: Diagnosis not present

## 2017-11-05 DIAGNOSIS — N911 Secondary amenorrhea: Secondary | ICD-10-CM

## 2017-11-05 DIAGNOSIS — M545 Low back pain, unspecified: Secondary | ICD-10-CM

## 2017-11-05 DIAGNOSIS — Z124 Encounter for screening for malignant neoplasm of cervix: Secondary | ICD-10-CM | POA: Diagnosis not present

## 2017-11-05 NOTE — Progress Notes (Unsigned)
ref

## 2017-11-05 NOTE — Progress Notes (Signed)
Patient ID: Bethany [name], female   DOB: 20-Sep-1980, 37 y.o.   MRN: [id number]  Reason for Consult: Referral   Referred by Alba CorySowles, Krichna, MD  Subjective:     HPI:  Bethany BanasSonya Bittinger is a 37 y.o. female she presents today for evaluation of secondary amenorrhea. She reports that she did nto have a period in November, December, January, or February. She  Did have a period at the beginning of March but it was heavier than usual. She reports that prior to this she had regular periods since menarche. She is nervous that she is perimenopausal . She reports that her mother entered menopause in her late thirties. The patient reports menarche at 5112-13. Since then she has had mostly regular monthly periods every 28-30 days. She reports 4-5 days of moderate bleeding with most periods. She reports hot flashes and sleep disturbances. She  Does report significant stress in her life that her primary care doctor discussed with her may be another cause of her amenorrhea. Patient reports that she has been taking her children o counseling. The counselor has told her that she is depressed. She was given a prescription for Zoloft but has not started taking it yet because she was concerned about the finding on a recent brain MRI. She is seen a neurologist in May and will consider starting Zoloft after she speaks to him.   History reviewed. No pertinent past medical history. Family History  Problem Relation Age of Onset  . Emphysema Mother   . Hypercalcemia Mother   . Hypertension Mother   . Sickle cell trait Mother   . Clotting disorder Mother   . Prostate cancer Father   . Diabetes Mellitus II Father   . Hypertension Father   . Heart Problems Father        Enlarged Heart  . Thyroid disease Sister   . Asthma Paternal Grandmother   . Lung cancer Maternal Uncle   . Lung cancer Maternal Aunt   . Kidney disease Paternal Aunt    Past Surgical History:  Procedure Laterality Date  . CESAREAN SECTION  2002    . CESAREAN SECTION  2005    Short Social History:  Social History   Tobacco Use  . Smoking status: Never Smoker  . Smokeless tobacco: Never Used  Substance Use Topics  . Alcohol use: Yes    Alcohol/week: 0.0 oz    Comment: occasionally    No Known Allergies  Current Outpatient Medications  Medication Sig Dispense Refill  . Multiple Vitamin (MULTIVITAMIN WITH MINERALS) TABS tablet Take 1 tablet by mouth daily.    Marland Kitchen. azelastine (ASTELIN) 0.1 % nasal spray   0  . baclofen (LIORESAL) 10 MG tablet Take 1 tablet (10 mg total) by mouth at bedtime. (Patient not taking: Reported on 11/04/2017) 30 each 0  . escitalopram (LEXAPRO) 10 MG tablet Take 1 tablet (10 mg total) by mouth daily. (Patient not taking: Reported on 10/05/2017) 30 tablet 0  . fluticasone (FLONASE) 50 MCG/ACT nasal spray Place 2 sprays into both nostrils daily. (Patient not taking: Reported on 11/04/2017) 16 g 6  . montelukast (SINGULAIR) 10 MG tablet   0   No current facility-administered medications for this visit.     Review of Systems  Constitutional: Negative for chills, fatigue, fever and unexpected weight change.  HENT: Negative for trouble swallowing.  Eyes: Negative for loss of vision.  Respiratory: Negative for cough, shortness of breath and wheezing.  Cardiovascular: Negative for chest pain, leg swelling,  palpitations and syncope.  GI: Negative for abdominal pain, blood in stool, diarrhea, nausea and vomiting.  GU: Negative for difficulty urinating, dysuria, frequency and hematuria.  Musculoskeletal: Negative for back pain, leg pain and joint pain.  Skin: Negative for rash.  Neurological: Negative for dizziness, headaches, light-headedness, numbness and seizures.  Psychiatric: Negative for behavioral problem, confusion, depressed mood and sleep disturbance.        Objective:  Objective   Vitals:   11/05/17 0846  BP: 134/78  Weight: 204 lb (92.5 kg)  Height: 5' (1.524 m)   Body mass index is 39.84  kg/m.  Physical Exam  Constitutional: She is oriented to person, place, and time. She appears well-developed and well-nourished.  HENT:  Head: Normocephalic and atraumatic.  Eyes: EOM are normal.  Cardiovascular: Normal rate, regular rhythm and normal heart sounds.  Pulmonary/Chest: Effort normal and breath sounds normal. No breast swelling or tenderness.  Genitourinary: Vagina normal and uterus normal. No labial fusion. There is no rash, tenderness, lesion or injury on the right labia. There is no rash, tenderness, lesion or injury on the left labia. No tenderness in the vagina. No signs of injury around the vagina. No vaginal discharge found.  Neurological: She is alert and oriented to person, place, and time.  Skin: Skin is warm and dry.  Psychiatric: She has a normal mood and affect. Her behavior is normal. Judgment and thought content normal.  Nursing note and vitals reviewed.        Assessment/Plan:     [id number] with secondary amenorrhea. Will obtain a FSH to access ovarian function. Patient has already had normal TSH and prolactin. Given that the patient's period has returned this month further evaluation or management may not be needed.  Discussed management option moving forward. She does not desire birth control or any form of contraception at this time. Patient did mention that she had a mammogram performed before. She would like another mammogram but she is under 40 which is when mammograms are generally initiated will attempt to obtain records for previous mammogram and if needed will order again.       Natale Milch MD [facility], [facility] 11/09/17 12:01 AM

## 2017-11-06 LAB — FOLLICLE STIMULATING HORMONE: FSH: 5.6 m[IU]/mL

## 2017-11-06 NOTE — Progress Notes (Signed)
Called, no anwser, left message that I would release to mychart.

## 2017-11-09 ENCOUNTER — Encounter: Payer: Self-pay | Admitting: Obstetrics and Gynecology

## 2017-11-10 LAB — PAPIG, HPV, RFX 16/18: PAP Smear Comment: 0

## 2017-11-10 LAB — HPV, LOW VOLUME (REFLEX): HPV low volume reflex: NEGATIVE

## 2017-11-10 NOTE — Progress Notes (Signed)
NIL, repeat in 3 years, released to Mychart.

## 2017-11-12 DIAGNOSIS — R51 Headache: Secondary | ICD-10-CM | POA: Diagnosis not present

## 2017-11-12 DIAGNOSIS — R430 Anosmia: Secondary | ICD-10-CM | POA: Diagnosis not present

## 2017-12-01 DIAGNOSIS — M25552 Pain in left hip: Secondary | ICD-10-CM | POA: Diagnosis not present

## 2017-12-04 ENCOUNTER — Encounter: Payer: Self-pay | Admitting: Family Medicine

## 2017-12-04 ENCOUNTER — Ambulatory Visit: Payer: BLUE CROSS/BLUE SHIELD | Admitting: Family Medicine

## 2017-12-04 VITALS — BP 128/86 | HR 77 | Temp 98.6°F | Resp 16 | Ht 60.0 in | Wt 198.6 lb

## 2017-12-04 DIAGNOSIS — R519 Headache, unspecified: Secondary | ICD-10-CM

## 2017-12-04 DIAGNOSIS — R439 Unspecified disturbances of smell and taste: Secondary | ICD-10-CM

## 2017-12-04 DIAGNOSIS — R7303 Prediabetes: Secondary | ICD-10-CM | POA: Diagnosis not present

## 2017-12-04 DIAGNOSIS — N915 Oligomenorrhea, unspecified: Secondary | ICD-10-CM

## 2017-12-04 DIAGNOSIS — R51 Headache: Secondary | ICD-10-CM | POA: Diagnosis not present

## 2017-12-04 DIAGNOSIS — J069 Acute upper respiratory infection, unspecified: Secondary | ICD-10-CM

## 2017-12-04 DIAGNOSIS — R9089 Other abnormal findings on diagnostic imaging of central nervous system: Secondary | ICD-10-CM | POA: Diagnosis not present

## 2017-12-04 DIAGNOSIS — M25552 Pain in left hip: Secondary | ICD-10-CM

## 2017-12-04 DIAGNOSIS — G8929 Other chronic pain: Secondary | ICD-10-CM | POA: Diagnosis not present

## 2017-12-04 DIAGNOSIS — E669 Obesity, unspecified: Secondary | ICD-10-CM

## 2017-12-04 MED ORDER — BENZONATATE 100 MG PO CAPS: 100.0000 mg | ORAL_CAPSULE | Freq: Three times a day (TID) | ORAL | 0 refills | Status: DC | PRN

## 2017-12-04 NOTE — Progress Notes (Signed)
Name: Bethany [name]   MRN: [id number]    DOB: 09/10/1980   Date:12/04/2017       Progress Note  Subjective  Chief Complaint  Chief Complaint  Patient presents with  . Follow-up    2 month F/U-Stopped all medications    HPI  Depression/GAD: she is feeling better, she has been eating healthier, joined a gym and has been exercising, stopped medications once she saw neurologist and was given reassurance. She does not want to take medications at this time. No suicidal thoughts or ideation  Headache : very seldom now, not taking any medications for it. Relieved after seen by neurologist  Nasal congestion and smell disorder: seen by ENT and is having MRI done, she is off  on singulair and still has  Astelinand flonase but only using prn. Seen by Dr. Willeen Cass  Back pain: took baclofen , but still has pain, seen by Ortho at [facility] and was advised to have PT and use ice/heat , tylenol prn and go back if no improvement. She still has aching pain, worse on left hip, sometimes numbness on left lateral thight.   Oligomenorrhea: had a cycle in March, seen at [facility], labs normal, explained needs to bleed twice year since not menopausal yet, to call me or gyn for Provera if no cycles in 4 months  Pre-diabetes: she denies polyphagia, polyuria or polydipsia , hgbA1C 5.7. She saw dietician and is eating healthier. More vegetables, salad , cutting down on starches.   Obesity: on life style modification, exercising and eating healthier , losing weight.   URI: she has noticed rhinorrhea, and  productive cough that started about one week ago, took coricidin and is feeling better, but worried about mucus color. No fever, chills or SOB  Patient Active Problem List   Diagnosis Date Noted  . Chronic left hip pain 12/04/2017  . Smell disturbance 12/04/2017  . Obesity, Class II, BMI 35-39.9 12/04/2017  . Abnormal finding on MRI of brain 11/05/2017  . 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  Patellofemoral stress syndrome of both knees 08/09/2014    Past Surgical History:  Procedure Laterality Date  . CESAREAN SECTION  2002  . CESAREAN SECTION  2005    Family History  Problem Relation Age of Onset  . Emphysema Mother   . Hypercalcemia Mother   . Hypertension Mother   . Sickle cell trait Mother   . Clotting disorder Mother   . Prostate cancer Father   . Diabetes Mellitus II Father   . Hypertension Father   . Heart Problems Father        Enlarged Heart  . Thyroid disease Sister   . Asthma Paternal Grandmother   . Lung cancer Maternal Uncle   . Lung cancer Maternal Aunt   . Kidney disease Paternal Aunt     Social History   Socioeconomic History  . Marital status: Married    Spouse name: Casimiro Needle   . Number of children: 2  . Years of education: Not on file  . Highest education level: Some college, no degree  Occupational History  . Occupation: food service    Comment: at [employer]  . Financial resource strain: Somewhat hard  . Food insecurity:    Worry: Never true    Inability: Never true  . Transportation needs:    Medical: No    Non-medical: No  Tobacco Use  . Smoking status: Never Smoker  . Smokeless tobacco: Never Used  Substance and Sexual Activity  . Alcohol use: Yes    Alcohol/week: 0.0 oz    Comment: occasionally  . Drug use: No  . Sexual activity: Not Currently    Partners: Male    Birth control/protection: None  Lifestyle  . Physical activity:    Days per week: 0 days    Minutes per session: 0 min  . Stress: Very much  Relationships  . Social connections:    Talks on phone: More than three times a week    Gets together: Never    Attends religious service: More than 4 times per year    Active member of club or organization: No    Attends meetings of clubs or organizations: Never    Relationship status: Married  . Intimate partner violence:    Fear of current or ex partner: No    Emotionally abused: No     Physically abused: No    Forced sexual activity: No  Other Topics Concern  . Not on file  Social History Narrative   Married, has two children at home, her relatives live [place]   Husband incarcerated since 2014 for robbery, he is from [place] and may be deported next year when he gets out.    Works at [employer]    She moved to [place] 2011 for a safer place to raise her children      Current Outpatient Medications:  .  azelastine (ASTELIN) 0.1 % nasal spray, , Disp: , Rfl: 0 .  baclofen (LIORESAL) 10 MG tablet, Take 1 tablet (10 mg total) by mouth at bedtime. (Patient not taking: Reported on 11/04/2017), Disp: 30 each, Rfl: 0 .  fluticasone (FLONASE) 50 MCG/ACT nasal spray, Place 2 sprays into both nostrils daily. (Patient not taking: Reported on 11/04/2017), Disp: 16 g, Rfl: 6 .  Multiple Vitamin (MULTIVITAMIN WITH MINERALS) TABS tablet, Take 1 tablet by mouth daily., Disp: , Rfl:   No Known Allergies   ROS  Constitutional: Negative for fever, positive for mild weight change.  Respiratory: Positive  for cough but denies  shortness of breath.   Cardiovascular: Negative for chest pain or palpitations.  Gastrointestinal: Negative for abdominal pain, no bowel changes.  Musculoskeletal: Negative for gait problem or joint swelling.  Skin: Negative for rash.  Neurological: Negative for dizziness or headache.  No other specific complaints in a complete review of systems (except as listed in HPI above).  Objective  Vitals:   12/04/17 0930  BP: 128/86  Pulse: 77  Resp: 16  Temp: 98.6 F (37 C)  TempSrc: Oral  SpO2: 99%  Weight: 198 lb 9.6 oz (90.1 kg)  Height: 5' (1.524 m)    Body mass index is 38.79 kg/m.  Physical Exam  Constitutional: Patient appears well-developed and well-nourished. Obese  No distress.  HEENT: head atraumatic, normocephalic, pupils equal and reactive to light, ears normal TM,neck supple, throat within normal limits Cardiovascular: Normal rate, regular  rhythm and normal heart sounds.  No murmur heard. No BLE edema. Pulmonary/Chest: Effort normal and breath sounds normal. No respiratory distress. Abdominal: Soft.  There is no tenderness. Psychiatric: Patient has a normal mood and affect. behavior is normal. Judgment and thought content normal.  Recent Results (from the past 2160 hour(s))  COMPLETE METABOLIC PANEL WITH GFR     Status: None   Collection Time: 09/11/17 11:56 AM  Result Value Ref Range   Glucose, Bld 89 65 - 99 mg/dL    Comment: .  Fasting reference interval .    BUN 9 7 - 25 mg/dL   Creat 1.61 0.96 - 0.45 mg/dL   GFR, Est Non African American 87 > OR = 60 mL/min/1.33m2   GFR, Est African American 101 > OR = 60 mL/min/1.78m2   BUN/Creatinine Ratio NOT APPLICABLE 6 - 22 (calc)   Sodium 138 135 - 146 mmol/L   Potassium 3.9 3.5 - 5.3 mmol/L   Chloride 107 98 - 110 mmol/L   CO2 26 20 - 32 mmol/L   Calcium 9.3 8.6 - 10.2 mg/dL   Total Protein 7.2 6.1 - 8.1 g/dL   Albumin 4.2 3.6 - 5.1 g/dL   Globulin 3.0 1.9 - 3.7 g/dL (calc)   AG Ratio 1.4 1.0 - 2.5 (calc)   Total Bilirubin 0.5 0.2 - 1.2 mg/dL   Alkaline phosphatase (APISO) 52 33 - 115 U/L   AST 18 10 - 30 U/L   ALT 18 6 - 29 U/L  Hemoglobin A1c     Status: Abnormal   Collection Time: 09/11/17 11:56 AM  Result Value Ref Range   Hgb A1c MFr Bld 5.7 (H) <5.7 % of total Hgb    Comment: For someone without known diabetes, a hemoglobin  A1c value between 5.7% and 6.4% is consistent with prediabetes and should be confirmed with a  follow-up test. . For someone with known diabetes, a value <7% indicates that their diabetes is well controlled. A1c targets should be individualized based on duration of diabetes, age, comorbid conditions, and other considerations. . This assay result is consistent with an increased risk of diabetes. . Currently, no consensus exists regarding use of hemoglobin A1c for diagnosis of diabetes for children. .    Mean Plasma  Glucose 117 (calc)   eAG (mmol/L) 6.5 (calc)  HIV antibody     Status: None   Collection Time: 09/11/17 11:56 AM  Result Value Ref Range   HIV 1&2 Ab, 4th Generation NON-REACTIVE NON-REACTI    Comment: HIV-1 antigen and HIV-1/HIV-2 antibodies were not detected. There is no laboratory evidence of HIV infection. Marland Kitchen PLEASE NOTE: This information has been disclosed to you from records whose confidentiality may be protected by state law.  If your state requires such protection, then the state law prohibits you from making any further disclosure of the information without the specific written consent of the person to whom it pertains, or as otherwise permitted by law. A general authorization for the release of medical or other information is NOT sufficient for this purpose. . For additional information please refer to http://education.[facility].com/faq/FAQ106 (This link is being provided for informational/ educational purposes only.) . Marland Kitchen The performance of this assay has not been clinically validated in patients less than 73 years old. .   Lipid panel     Status: None   Collection Time: 09/11/17 11:56 AM  Result Value Ref Range   Cholesterol 151 <200 mg/dL   HDL 60 >40 mg/dL   Triglycerides 98 <981 mg/dL   LDL Cholesterol (Calc) 73 mg/dL (calc)    Comment: Reference range: <100 . Desirable range <100 mg/dL for primary prevention;   <70 mg/dL for patients with CHD or diabetic patients  with > or = 2 CHD risk factors. Marland Kitchen LDL-C is now calculated using the Martin-Hopkins  calculation, which is a validated novel method providing  better accuracy than the Friedewald equation in the  estimation of LDL-C.  Horald Pollen et al. Lenox Ahr. 1914;782(95): 2061-2068  (http://education.[facility].com/faq/FAQ164)    Total CHOL/HDL Ratio  2.5 <5.0 (calc)   Non-HDL Cholesterol (Calc) 91 <161 mg/dL (calc)    Comment: For patients with diabetes plus 1 major ASCVD risk  factor, treating to  a non-HDL-C goal of <100 mg/dL  (LDL-C of <09 mg/dL) is considered a therapeutic  option.   CBC with Differential/Platelet     Status: Abnormal   Collection Time: 09/11/17 11:56 AM  Result Value Ref Range   WBC 3.7 (L) 3.8 - 10.8 Thousand/uL   RBC 4.96 3.80 - 5.10 Million/uL   Hemoglobin 13.5 11.7 - 15.5 g/dL   HCT 60.4 54.0 - 98.1 %   MCV 80.4 80.0 - 100.0 fL   MCH 27.2 27.0 - 33.0 pg   MCHC 33.8 32.0 - 36.0 g/dL   RDW 19.1 47.8 - 29.5 %   Platelets 235 140 - 400 Thousand/uL   MPV 10.1 7.5 - 12.5 fL   Neutro Abs 1,698 1,500 - 7,800 cells/uL   Lymphs Abs 1,635 850 - 3,900 cells/uL   WBC mixed population 278 200 - 950 cells/uL   Eosinophils Absolute 70 15 - 500 cells/uL   Basophils Absolute 19 0 - 200 cells/uL   Neutrophils Relative % 45.9 %   Total Lymphocyte 44.2 %   Monocytes Relative 7.5 %   Eosinophils Relative 1.9 %   Basophils Relative 0.5 %  RPR     Status: None   Collection Time: 09/11/17 11:56 AM  Result Value Ref Range   RPR Ser Ql NON-REACTIVE NON-REACTI  Hepatitis, Acute     Status: None   Collection Time: 09/11/17 11:56 AM  Result Value Ref Range   Hep A IgM NON-REACTIVE NON-REACTI   Hepatitis B Surface Ag NON-REACTIVE NON-REACTI   Hep B C IgM NON-REACTIVE NON-REACTI   Hepatitis C Ab NON-REACTIVE NON-REACTI   SIGNAL TO CUT-OFF 0.03 <1.00  Sickle Cell Scr     Status: None   Collection Time: 09/11/17 11:56 AM  Result Value Ref Range   Sickle Solubility Test - HGBRFX NEGATIVE NEGATIVE    Comment: . Hemoglobin solubility testing alone is insufficient for detecting or confirming the presence of sickling hemoglobins in some situations. Additional testing may be required for diagnosis of hemoglobinopathies. For more information on this test go to: http://education.[facility].com/faq/FAQ99v1 .   TSH     Status: None   Collection Time: 09/11/17 11:56 AM  Result Value Ref Range   TSH 1.47 mIU/L    Comment:           Reference Range .           > or =  20 Years  0.40-4.50 .                Pregnancy Ranges           First trimester    0.26-2.66           Second trimester   0.55-2.73           Third trimester    0.43-2.91   Prolactin     Status: None   Collection Time: 09/11/17 11:56 AM  Result Value Ref Range   Prolactin 10.4 ng/mL    Comment:             Reference Range  Females         Non-pregnant        3.0-30.0         Pregnant           10.0-209.0  Postmenopausal      2.0-20.0 . . .   POCT urine pregnancy     Status: Normal   Collection Time: 09/11/17 11:57 AM  Result Value Ref Range   Preg Test, Ur Negative Negative  C. trachomatis/N. gonorrhoeae RNA     Status: None   Collection Time: 09/11/17  2:36 PM  Result Value Ref Range   C. trachomatis RNA, TMA NOT DETECTED NOT DETECT   N. gonorrhoeae RNA, TMA NOT DETECTED NOT DETECT    Comment: This test was performed using the APTIMA COMBO2 Assay ([employer] . The analytical performance characteristics of this  assay, when used to test SurePath specimens have been determined by [employer]. Marland Kitchen   POCT Urinalysis Dipstick     Status: Abnormal   Collection Time: 10/05/17  2:29 PM  Result Value Ref Range   Color, UA yellow    Clarity, UA clear    Glucose, UA negative    Bilirubin, UA negative    Ketones, UA negative    Spec Grav, UA 1.015 1.010 - 1.025   Blood, UA negative    pH, Initial 6    Protein, UA negative    Urobilinogen, UA negative (A) 0.2 or 1.0 E.U./dL   Nitrite, UA negative    Leukocytes, UA Negative Negative   Appearance normal    Odor none   PapIG, HPV, rfx 16/18     Status: None   Collection Time: 11/05/17  9:40 AM  Result Value Ref Range   DIAGNOSIS: Comment     Comment: NEGATIVE FOR INTRAEPITHELIAL LESION OR MALIGNANCY.   Specimen adequacy: Comment     Comment: Satisfactory for evaluation. Endocervical and/or squamous metaplastic cells (endocervical component) are present.    Clinician Provided ICD10 Comment     Comment:  Z00.00 Z12.4    Performed by: Comment     Comment: Suzette Battiest, Cytotechnologist (ASCP)   PAP Smear Comment .    Note: Comment     Comment: The Pap smear is a screening test designed to aid in the detection of premalignant and malignant conditions of the uterine cervix.  It is not a diagnostic procedure and should not be used as the sole means of detecting cervical cancer.  Both false-positive and false-negative reports do occur.    Test Methodology Comment     Comment: This liquid based ThinPrep(R) pap test was screened with the use of an image guided system.    HPV, high-risk CANCELED     Comment: The quantity of specimen remaining in the vial after Pap slide preparation was less than the 4 mL minimum cell suspension required. Low sample cellularity may be the cause.  See HPV, low volume rfx test result. This high-risk HPV test detects thirteen high-risk types (16/18/31/33/35/39/45/51/52/56/58/59/68) without differentiation.  Result canceled by the ancillary.   HPV, low volume (reflex)     Status: None   Collection Time: 11/05/17  9:40 AM  Result Value Ref Range   HPV low volume reflex Negative Negative    Comment: This test detects fourteen high-risk HPV types (16,18,31,33,35,39,45, 51,52,56,58,59,66,68) without differentiation.   FSH     Status: None   Collection Time: 11/05/17 10:02 AM  Result Value Ref Range   FSH 5.6 mIU/mL    Comment:                     Adult Female:  Follicular phase      3.5 -  12.5                       Ovulation phase       4.7 -  21.5                       Luteal phase          1.7 -   7.7                       Postmenopausal       25.8 - 134.8       PHQ2/9: Depression screen [facility]HQ 2/9 12/04/2017 11/04/2017 10/05/2017 09/11/2017  Decreased Interest 1 0 1 1  Down, Depressed, Hopeless 1 0 1 1  PHQ - 2 Score 2 0 2 2  Altered sleeping 1 - 3 2  Tired, decreased energy 1 - 3 3  Change in appetite 1 - 3 3  Feeling bad or  failure about yourself  1 - 2 1  Trouble concentrating 0 - 2 2  Moving slowly or fidgety/restless 0 - 0 0  Suicidal thoughts 0 - 0 0  PHQ-9 Score 6 - 15 13  Difficult doing work/chores Somewhat difficult - Somewhat difficult Somewhat difficult     Fall Risk: Fall Risk  12/04/2017 11/04/2017 10/05/2017 09/11/2017  Falls in the past year? No No No No    Functional Status Survey:   Independent   I personally reviewed Family, Social and Surgical history with the patient/caregiver today.    Assessment & Plan  1. Pre-diabetes  She has lost weight, eating healthier, continue the hard work   2. Chronic left hip pain  Seen by Ortho, starting PT and will follow up with them as needed   3. Abnormal finding on MRI of brain  Mild changes, reviewed with patient by Dr. Sherryll BurgerShah, reviewed results again with patient   4. Smell disturbance  Resolved, taking MVI and stopped using nasal sprays  5. Obesity, Class II, BMI 35-39.9  Discussed with the patient the risk posed by an increased BMI. Discussed importance of portion control, calorie counting and at least 150 minutes of physical activity weekly. Avoid sweet beverages and drink more water. Eat at least 6 servings of fruit and vegetables daily   6. Headache disorder  Improved, less stressed   7. Oligomenorrhea, unspecified type  Seeing Gyn, advised to follow up as needed.   8. Viral upper respiratory tract infection  - benzonatate (TESSALON) 100 MG capsule; Take 1-2 capsules (100-200 mg total) by mouth 3 (three) times daily as needed.  Dispense: 40 capsule; Refill: 0

## 2017-12-10 DIAGNOSIS — M25552 Pain in left hip: Secondary | ICD-10-CM | POA: Diagnosis not present

## 2017-12-18 DIAGNOSIS — M25552 Pain in left hip: Secondary | ICD-10-CM | POA: Diagnosis not present

## 2017-12-25 DIAGNOSIS — M25552 Pain in left hip: Secondary | ICD-10-CM | POA: Diagnosis not present

## 2017-12-28 DIAGNOSIS — M25552 Pain in left hip: Secondary | ICD-10-CM | POA: Diagnosis not present

## 2017-12-30 DIAGNOSIS — M25552 Pain in left hip: Secondary | ICD-10-CM | POA: Diagnosis not present

## 2018-01-01 ENCOUNTER — Ambulatory Visit: Payer: BLUE CROSS/BLUE SHIELD | Admitting: Obstetrics and Gynecology

## 2018-01-05 ENCOUNTER — Encounter: Payer: Self-pay | Admitting: Obstetrics and Gynecology

## 2018-01-05 ENCOUNTER — Ambulatory Visit: Payer: BLUE CROSS/BLUE SHIELD | Admitting: Obstetrics and Gynecology

## 2018-01-05 VITALS — BP 110/80 | Ht 60.0 in | Wt 192.0 lb

## 2018-01-05 DIAGNOSIS — N6322 Unspecified lump in the left breast, upper inner quadrant: Secondary | ICD-10-CM

## 2018-01-05 DIAGNOSIS — N6311 Unspecified lump in the right breast, upper outer quadrant: Secondary | ICD-10-CM

## 2018-01-05 NOTE — Progress Notes (Signed)
Patient ID: Noha Milberger, female   DOB: Feb 04, 1981, 38 y.o.   MRN: [id number]  Reason for Consult: Follow-up   Referred by Alba Cory, MD  Subjective:     HPI:  Kashmere Daywalt is a 37 y.o. female she was seen originally for a missed period. She is concerned about palpable breast masses in her left breast. She is unsure of how long this mass has been present. She has not had any changes to the skin of her breast or drainage form her nipples. 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 Records were reviewed.  History reviewed. No pertinent past medical history. Family History  Problem Relation Age of Onset  . Emphysema Mother   . Hypercalcemia Mother   . Hypertension Mother   . Sickle cell trait Mother   . Clotting disorder Mother   . Prostate cancer Father   . Diabetes Mellitus II Father   . Hypertension Father   . Heart Problems Father        Enlarged Heart  . Thyroid disease Sister   . Asthma Paternal Grandmother   . Lung cancer Maternal Uncle   . Lung cancer Maternal Aunt   . Kidney disease Paternal Aunt    Past Surgical History:  Procedure Laterality Date  . CESAREAN SECTION  2002  . CESAREAN SECTION  2005    Short Social History:  Social History   Tobacco Use  . Smoking status: Never Smoker  . Smokeless tobacco: Never Used  Substance Use Topics  . Alcohol use: Yes    Alcohol/week: 0.0 oz    Comment: occasionally    No Known Allergies  Current Outpatient Medications  Medication Sig Dispense Refill  . azelastine (ASTELIN) 0.1 % nasal spray   0  . baclofen (LIORESAL) 10 MG tablet Take 1 tablet (10 mg total) by mouth at bedtime. (Patient not taking: Reported on 11/04/2017) 30 each 0  . benzonatate (TESSALON) 100 MG capsule Take 1-2 capsules (100-200 mg total) by mouth 3 (three) times daily as needed. 40 capsule 0  . fluticasone (FLONASE) 50 MCG/ACT nasal spray Place 2 sprays into both nostrils daily. (Patient  not taking: Reported on 11/04/2017) 16 g 6  . Multiple Vitamin (MULTIVITAMIN WITH MINERALS) TABS tablet Take 1 tablet by mouth daily.     No current facility-administered medications for this visit.     Review of Systems  Constitutional: Negative for chills, fatigue, fever and unexpected weight change.  HENT: Negative for trouble swallowing.  Eyes: Negative for loss of vision.  Respiratory: Negative for cough, shortness of breath and wheezing.  Cardiovascular: Negative for chest pain, leg swelling, palpitations and syncope.  GI: Negative for abdominal pain, blood in stool, diarrhea, nausea and vomiting.  GU: Negative for difficulty urinating, dysuria, frequency and hematuria.  Musculoskeletal: Negative for back pain, leg pain and joint pain.  Skin: Negative for rash.  Neurological: Negative for dizziness, headaches, light-headedness, numbness and seizures.  Psychiatric: Negative for behavioral problem, confusion, depressed mood and sleep disturbance.        Objective:  Objective   Vitals:   01/05/18 1349  BP: 110/80  Weight: 192 lb (87.1 kg)  Height: 5' (1.524 m)   Body mass index is 37.5 kg/m.  Physical Exam  Constitutional: She is oriented to person, place, and time. She appears well-developed and well-nourished.  HENT:  Head: Normocephalic and atraumatic.  Eyes: EOM are normal.  Cardiovascular: Normal rate, regular rhythm and normal heart  sounds.  Pulmonary/Chest: Effort normal and breath sounds normal.    Neurological: She is alert and oriented to person, place, and time.  Skin: Skin is warm and dry.  Psychiatric: She has a normal mood and affect. Her behavior is normal. Judgment and thought content normal.  Nursing note and vitals reviewed.       Assessment/Plan:     37 yo 1. Amenorrhea- patient has resumed having menses. Last period this month 12/21/16. 5 days of bleeding, heaviest days using 3-4 pads. Discussed following up if periods become irregular with  more than 40 days between periods or if there is a change in her amount of length of bleeding.  2. Breast masses- will obtain breast diagnostic mammogram  Adelene Idler MD [facility], [facility] 01/05/18 2:19 PM

## 2018-01-06 ENCOUNTER — Other Ambulatory Visit: Payer: Self-pay | Admitting: Obstetrics and Gynecology

## 2018-01-06 ENCOUNTER — Telehealth: Payer: Self-pay

## 2018-01-06 DIAGNOSIS — N6322 Unspecified lump in the left breast, upper inner quadrant: Secondary | ICD-10-CM

## 2018-01-06 DIAGNOSIS — N6311 Unspecified lump in the right breast, upper outer quadrant: Secondary | ICD-10-CM

## 2018-01-06 NOTE — Telephone Encounter (Signed)
Pt called triage stating [facility] needed signed orders for referral. CS has now signed orders and she should be able to call later this PM and schedule an appt. Called pt, no answer. Please let her know when she calls back . Thank you

## 2018-01-07 DIAGNOSIS — M25552 Pain in left hip: Secondary | ICD-10-CM | POA: Diagnosis not present

## 2018-01-12 DIAGNOSIS — M7062 Trochanteric bursitis, left hip: Secondary | ICD-10-CM | POA: Diagnosis not present

## 2018-01-12 DIAGNOSIS — M25552 Pain in left hip: Secondary | ICD-10-CM | POA: Diagnosis not present

## 2018-01-14 ENCOUNTER — Encounter: Payer: Self-pay | Admitting: Family Medicine

## 2018-01-14 DIAGNOSIS — M25552 Pain in left hip: Secondary | ICD-10-CM | POA: Diagnosis not present

## 2018-01-20 DIAGNOSIS — M25552 Pain in left hip: Secondary | ICD-10-CM | POA: Diagnosis not present

## 2018-01-26 DIAGNOSIS — M25552 Pain in left hip: Secondary | ICD-10-CM | POA: Diagnosis not present

## 2018-02-01 ENCOUNTER — Other Ambulatory Visit: Payer: Self-pay

## 2018-02-03 DIAGNOSIS — M25552 Pain in left hip: Secondary | ICD-10-CM | POA: Diagnosis not present

## 2018-02-04 ENCOUNTER — Other Ambulatory Visit: Payer: Self-pay

## 2018-02-05 ENCOUNTER — Ambulatory Visit
Admission: RE | Admit: 2018-02-05 | Discharge: 2018-02-05 | Disposition: A | Discharge status: Home / Self Care | Payer: BLUE CROSS/BLUE SHIELD | Source: Ambulatory Visit | Attending: Obstetrics and Gynecology | Admitting: Obstetrics and Gynecology

## 2018-02-05 ENCOUNTER — Encounter: Payer: Self-pay | Admitting: Radiology

## 2018-02-05 DIAGNOSIS — N6311 Unspecified lump in the right breast, upper outer quadrant: Secondary | ICD-10-CM

## 2018-02-05 DIAGNOSIS — N6310 Unspecified lump in the right breast, unspecified quadrant: Secondary | ICD-10-CM | POA: Diagnosis not present

## 2018-02-05 DIAGNOSIS — N6320 Unspecified lump in the left breast, unspecified quadrant: Secondary | ICD-10-CM | POA: Diagnosis not present

## 2018-02-05 DIAGNOSIS — N6322 Unspecified lump in the left breast, upper inner quadrant: Secondary | ICD-10-CM

## 2018-02-05 DIAGNOSIS — R922 Inconclusive mammogram: Secondary | ICD-10-CM | POA: Diagnosis not present

## 2018-02-15 ENCOUNTER — Other Ambulatory Visit: Payer: Self-pay | Admitting: Family Medicine

## 2018-02-15 ENCOUNTER — Encounter: Payer: Self-pay | Admitting: Family Medicine

## 2018-02-15 DIAGNOSIS — E6609 Other obesity due to excess calories: Secondary | ICD-10-CM | POA: Diagnosis not present

## 2018-02-15 DIAGNOSIS — Z6838 Body mass index (BMI) 38.0-38.9, adult: Secondary | ICD-10-CM | POA: Diagnosis not present

## 2018-02-15 DIAGNOSIS — R0683 Snoring: Secondary | ICD-10-CM | POA: Diagnosis not present

## 2018-02-15 DIAGNOSIS — G43019 Migraine without aura, intractable, without status migrainosus: Secondary | ICD-10-CM | POA: Diagnosis not present

## 2018-02-15 MED ORDER — TOPIRAMATE 25 MG PO TABS: 25.0000 mg | ORAL_TABLET | Freq: Every day | ORAL | 0 refills | Status: DC

## 2018-02-19 DIAGNOSIS — M25552 Pain in left hip: Secondary | ICD-10-CM | POA: Diagnosis not present

## 2018-02-24 ENCOUNTER — Encounter: Payer: Self-pay | Admitting: Family Medicine

## 2018-02-26 DIAGNOSIS — M25552 Pain in left hip: Secondary | ICD-10-CM | POA: Diagnosis not present

## 2018-03-10 ENCOUNTER — Encounter: Payer: Self-pay | Admitting: Family Medicine

## 2018-03-10 ENCOUNTER — Ambulatory Visit: Payer: BLUE CROSS/BLUE SHIELD | Admitting: Family Medicine

## 2018-03-10 VITALS — BP 138/88 | HR 88 | Temp 98.4°F | Resp 18 | Ht 60.0 in | Wt 191.9 lb

## 2018-03-10 DIAGNOSIS — D708 Other neutropenia: Secondary | ICD-10-CM | POA: Diagnosis not present

## 2018-03-10 DIAGNOSIS — M25552 Pain in left hip: Secondary | ICD-10-CM | POA: Diagnosis not present

## 2018-03-10 DIAGNOSIS — F321 Major depressive disorder, single episode, moderate: Secondary | ICD-10-CM | POA: Diagnosis not present

## 2018-03-10 DIAGNOSIS — R9089 Other abnormal findings on diagnostic imaging of central nervous system: Secondary | ICD-10-CM | POA: Diagnosis not present

## 2018-03-10 DIAGNOSIS — R030 Elevated blood-pressure reading, without diagnosis of hypertension: Secondary | ICD-10-CM

## 2018-03-10 DIAGNOSIS — R439 Unspecified disturbances of smell and taste: Secondary | ICD-10-CM

## 2018-03-10 DIAGNOSIS — E669 Obesity, unspecified: Secondary | ICD-10-CM

## 2018-03-10 DIAGNOSIS — R7303 Prediabetes: Secondary | ICD-10-CM

## 2018-03-10 DIAGNOSIS — G8929 Other chronic pain: Secondary | ICD-10-CM

## 2018-03-10 DIAGNOSIS — F411 Generalized anxiety disorder: Secondary | ICD-10-CM

## 2018-03-10 NOTE — Addendum Note (Signed)
Addended by: Alba CorySOWLES, Rooney Swails F on: 03/10/2018 01:58 PM   Modules accepted: Orders

## 2018-03-10 NOTE — Progress Notes (Addendum)
Name: Bethany [name]   MRN: [id number]    DOB: [birth year]   Date:03/10/2018       Progress Note  Subjective  Chief Complaint  Chief Complaint  Patient presents with  . Elevated Blood pressure reading    States her BP has been running high around 144/92, headaches and dizzy spells, her right ankle will swell up. Both parents had HTN and wanted to be checked.    HPI  Elevated blood pressure: she states she has been under a lot of stress because daughter is struggling right now. Very depressed, not eating , not speaking to her since an incidence with daughter's girlfriend. She states bp was checked at home by her mother in law and than again at [facility] and bp was above 140/90. She has noticed more headaches again, no change in vision.   Chronic left hip pain: she has seen Dr. Landry MellowKubinski and is still having PT once a week. She has only a few visits left, but is holding off until she goes back to work. Also too depressed to do anything right now.   Abnormal smell: resolved with nasal spray, seen by Dr. Andee PolesVaught, MRI showed some abnormalities, seen by Dr. Sherryll BurgerShah and was given reassurance, she states she has intermittent numbness and tingling on right arm and leg, sporadic and not associated with weakness.   Major Depression and GAD: she is more stressed over the past few weeks. Husband is still incarcerated but has not been contacting her. She stopped visiting him 2 months ago because they were always arguing when she visited him. He has not been contacting their children and is causing stress at home to increase. She is financially struggling. She works at [employer] and goes back in August. Daughter is 39 yo and has been dating another girl that is 39 yo, they got into a fight. Lamar LaundrySonya called the police and now her daughter is very upset at her. She denies suicidal thoughts or ideation. Her family is all in [place]. No close friends in town.   Obesity: she has changed her diet and has lost 10 lbs in the past 5  months.    Patient Active Problem List   Diagnosis Date Noted  . Chronic left hip pain 12/04/2017  . Smell disturbance 12/04/2017  . Obesity, Class II, BMI 35-39.9 12/04/2017  . Abnormal finding on MRI of brain 11/05/2017  . 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 Patellofemoral stress syndrome of both knees 08/09/2014    Past Surgical History:  Procedure Laterality Date  . CESAREAN SECTION  2002  . CESAREAN SECTION  2005    Family History  Problem Relation Age of Onset  . Emphysema Mother   . Hypercalcemia Mother   . Hypertension Mother   . Sickle cell trait Mother   . Clotting disorder Mother   . Prostate cancer Father   . Diabetes Mellitus II Father   . Hypertension Father   . Heart Problems Father        Enlarged Heart  . Thyroid disease Sister   . Asthma Paternal Grandmother   . Lung cancer Maternal Uncle   . Lung cancer Maternal Aunt   . Breast cancer Maternal Aunt        early 7460's  . Kidney disease Paternal Aunt     Social History   Socioeconomic History  . Marital status: Married    Spouse name: Casimiro NeedleMichael   . Number of children: 2  . Years of education: Not on file  .  Highest education level: Some college, no degree  Occupational History  . Occupation: food service    Comment: at [employer]  . Financial resource strain: Somewhat hard  . Food insecurity:    Worry: Never true    Inability: Never true  . Transportation needs:    Medical: No    Non-medical: No  Tobacco Use  . Smoking status: Never Smoker  . Smokeless tobacco: Never Used  Substance and Sexual Activity  . Alcohol use: Yes    Alcohol/week: 0.0 oz    Comment: occasionally  . Drug use: No  . Sexual activity: Not Currently    Partners: Male    Birth control/protection: None  Lifestyle  . Physical activity:    Days per week: 0 days    Minutes per session: 0 min  . Stress: Very much  Relationships  . Social connections:    Talks on phone: More than three  times a week    Gets together: Never    Attends religious service: More than 4 times per year    Active member of club or organization: No    Attends meetings of clubs or organizations: Never    Relationship status: Married  . Intimate partner violence:    Fear of current or ex partner: No    Emotionally abused: No    Physically abused: No    Forced sexual activity: No  Other Topics Concern  . Not on file  Social History Narrative   Married, has two children at home, her relatives live [place]   Husband incarcerated since 2014 for robbery, he is from [place] and may be deported next year when he gets out.    Works at [employer]    She moved to [place] 2011 for a safer place to raise her children      Current Outpatient Medications:  Marland Kitchen  Multiple Vitamin (MULTIVITAMIN WITH MINERALS) TABS tablet, Take 1 tablet by mouth daily., Disp: , Rfl:  .  azelastine (ASTELIN) 0.1 % nasal spray, , Disp: , Rfl: 0 .  baclofen (LIORESAL) 10 MG tablet, Take 1 tablet (10 mg total) by mouth at bedtime. (Patient not taking: Reported on 11/04/2017), Disp: 30 each, Rfl: 0 .  fluticasone (FLONASE) 50 MCG/ACT nasal spray, Place 2 sprays into both nostrils daily. (Patient not taking: Reported on 11/04/2017), Disp: 16 g, Rfl: 6  No Known Allergies   ROS  Constitutional: Negative for fever or weight change.  Respiratory: Negative for cough and shortness of breath.   Cardiovascular: Negative for chest pain or palpitations.  Gastrointestinal: Negative for abdominal pain, no bowel changes.  Musculoskeletal: Negative for gait problem or joint swelling.  Skin: Negative for rash.  Neurological: Negative for dizziness or headache.  No other specific complaints in a complete review of systems (except as listed in HPI above).  Objective  Vitals:   03/10/18 1318  BP: 138/88  Pulse: 88  Resp: 18  Temp: 98.4 F (36.9 C)  TempSrc: Oral  SpO2: 99%  Weight: 191 lb 14.4 oz (87 kg)  Height: 5' (1.524 m)    Body  mass index is 37.48 kg/m.  Physical Exam  Constitutional: Patient appears well-developed and well-nourished. Obese  No distress.  HEENT: head atraumatic, normocephalic, pupils equal and reactive to light, neck supple, throat within normal limits Cardiovascular: Normal rate, regular rhythm and normal heart sounds.  No murmur heard. No BLE edema. Pulmonary/Chest: Effort normal and breath sounds normal. No respiratory distress. Abdominal: Soft.  There is no tenderness. Psychiatric: Patient has a normal mood and affect. behavior is normal. Judgment and thought content normal.  PHQ2/9: Depression screen [facility] 2/9 03/10/2018 12/04/2017 11/04/2017 10/05/2017 09/11/2017  Decreased Interest 2 1 0 1 1  Down, Depressed, Hopeless 2 1 0 1 1  PHQ - 2 Score 4 2 0 2 2  Altered sleeping 3 1 - 3 2  Tired, decreased energy 3 1 - 3 3  Change in appetite 3 1 - 3 3  Feeling bad or failure about yourself  3 1 - 2 1  Trouble concentrating 2 0 - 2 2  Moving slowly or fidgety/restless 0 0 - 0 0  Suicidal thoughts 0 0 - 0 0  PHQ-9 Score 18 6 - 15 13  Difficult doing work/chores Very difficult Somewhat difficult - Somewhat difficult Somewhat difficult     Fall Risk: Fall Risk  03/10/2018 12/04/2017 11/04/2017 10/05/2017 09/11/2017  Falls in the past year? No No No No No     Functional Status Survey: Is the patient deaf or have difficulty hearing?: No Does the patient have difficulty seeing, even when wearing glasses/contacts?: Yes Does the patient have difficulty concentrating, remembering, or making decisions?: No Does the patient have difficulty walking or climbing stairs?: No Does the patient have difficulty dressing or bathing?: No Does the patient have difficulty doing errands alone such as visiting a doctor's office or shopping?: No    Assessment & Plan  1. Moderate major depression (HCC)  - Ambulatory referral to Psychology  2. Pre-diabetes  Last hgbA1C is 5.7% Recheck level   3. Chronic left  hip pain  Had PT, seen by Dr. Coralie Carpen   4. Abnormal finding on MRI of brain  Seen by Dr. Sherryll Burger   5. Smell disturbance  Resolved with nose spray   6. Obesity, Class II, BMI 35-39.9  Discussed with the patient the risk posed by an increased BMI. Discussed importance of portion control, calorie counting and at least 150 minutes of physical activity weekly. Avoid sweet beverages and drink more water. Eat at least 6 servings of fruit and vegetables daily  She has changed her diet and has lost 7 lbs  Since last visit. Lost 10 lbs int he last 5 months.   7. GAD (generalized anxiety disorder)  - Ambulatory referral to Psychology  8. Elevated blood pressure reading  Reassurance, bp is okay today, explained likely stress. She refuses referral to psychiatrist.   9. Other neutropenia (HCC)  - CBC with Differential/Platelet

## 2018-03-11 LAB — HEMOGLOBIN A1C
HEMOGLOBIN A1C: 5.7 %{Hb} — AB (ref <5.7)
Mean Plasma Glucose: 117 (calc)
eAG (mmol/L): 6.5 (calc)

## 2018-03-11 LAB — CBC WITH DIFFERENTIAL/PLATELET
BASOS ABS: 20 {cells}/uL (ref 0–200)
Basophils Relative: 0.5 %
EOS PCT: 5.2 %
Eosinophils Absolute: 203 cells/uL (ref 15–500)
HCT: 39.8 % (ref 35.0–45.0)
Hemoglobin: 13.3 g/dL (ref 11.7–15.5)
Lymphs Abs: 1658 cells/uL (ref 850–3900)
MCH: 27.2 pg (ref 27.0–33.0)
MCHC: 33.4 g/dL (ref 32.0–36.0)
MCV: 81.4 fL (ref 80.0–100.0)
MONOS PCT: 10.1 %
MPV: 9.7 fL (ref 7.5–12.5)
NEUTROS PCT: 41.7 %
Neutro Abs: 1626 cells/uL (ref 1500–7800)
PLATELETS: 286 10*3/uL (ref 140–400)
RBC: 4.89 10*6/uL (ref 3.80–5.10)
RDW: 13.4 % (ref 11.0–15.0)
TOTAL LYMPHOCYTE: 42.5 %
WBC mixed population: 394 cells/uL (ref 200–950)
WBC: 3.9 10*3/uL (ref 3.8–10.8)

## 2018-04-14 ENCOUNTER — Ambulatory Visit: Payer: BLUE CROSS/BLUE SHIELD | Admitting: Family Medicine

## 2018-06-07 ENCOUNTER — Encounter: Payer: Self-pay | Admitting: Family Medicine

## 2018-06-07 ENCOUNTER — Ambulatory Visit: Payer: BLUE CROSS/BLUE SHIELD | Admitting: Family Medicine

## 2018-06-07 VITALS — BP 120/64 | HR 96 | Temp 98.5°F | Resp 12 | Ht 60.0 in | Wt 183.3 lb

## 2018-06-07 DIAGNOSIS — R439 Unspecified disturbances of smell and taste: Secondary | ICD-10-CM | POA: Diagnosis not present

## 2018-06-07 DIAGNOSIS — M25552 Pain in left hip: Secondary | ICD-10-CM

## 2018-06-07 DIAGNOSIS — R058 Other specified cough: Secondary | ICD-10-CM

## 2018-06-07 DIAGNOSIS — E669 Obesity, unspecified: Secondary | ICD-10-CM | POA: Diagnosis not present

## 2018-06-07 DIAGNOSIS — G8929 Other chronic pain: Secondary | ICD-10-CM

## 2018-06-07 DIAGNOSIS — F321 Major depressive disorder, single episode, moderate: Secondary | ICD-10-CM | POA: Diagnosis not present

## 2018-06-07 DIAGNOSIS — R0602 Shortness of breath: Secondary | ICD-10-CM

## 2018-06-07 DIAGNOSIS — R05 Cough: Secondary | ICD-10-CM

## 2018-06-07 MED ORDER — AZITHROMYCIN 250 MG PO TABS: ORAL_TABLET | ORAL | 0 refills | Status: DC

## 2018-06-07 MED ORDER — BENZONATATE 100 MG PO CAPS: 100.0000 mg | ORAL_CAPSULE | Freq: Two times a day (BID) | ORAL | 0 refills | Status: DC | PRN

## 2018-06-07 NOTE — Progress Notes (Signed)
Name: Bethany [name]   MRN: [id number]    DOB: 05-29-1981   Date:06/07/2018       Progress Note  Subjective  Chief Complaint  Chief Complaint  Patient presents with  . Follow-up  . Cough    with chest congestion and sob    HPI  Productive cough: symptoms started about 10 days ago. She states she in [place] and got wet/ rained on. She states she developed a productive cough and some SOB with activity , sometimes needs to catch her breath when talking or walking. No fever or chills, but is feeling tired. She has a history of pneumonia, no previous history of DVT ( she has been travelling a lot lately) , mother has a history of DVT. She denies leg pain or swelling. She states cough is productive and yellow. She does not smoke  Chronic left hip pain: she has seen Dr. Landry Mellow and was having PT, however she states symptoms have resolved with weight loss.   Abnormal smell: resolved with nasal spray, seen by Dr. Andee Poles, MRI showed some abnormalities, seen by Dr. Sherryll Burger and was given reassurance. She states only has sensation of smoke smell is after she eats pizza, so she stopped eating pizza  Major Depression and GAD:  Husband is still incarcerated but has not been contacting her. She recently went to see him, but now he is getting transferred to a prison in [place], he has called the kids a few times recently but states he gets depressed when he speaks to them. She states currently really worried about her son - he is struggling with school, not fitting in ( he is homosexual)  She is also struggling financially. She works at [employer].  Daughter is 24 yo and is also homosexual, but doing well in school at this time and applying for college. She denies suicidal thoughts or ideation. Her family is all in [place].  Phq 9 has improved , down to 3 today. She states she loves her job and has co-workers that she can connect with. She applied for bankruptcy and is doing better  Obesity: she has changed  her diet and has lost 19 lbs in the past 8 months. She states she has been trying very hard, hip pain resolve and has been sleeping better.   Patient Active Problem List   Diagnosis Date Noted  . Chronic left hip pain 12/04/2017  . Smell disturbance 12/04/2017  . Obesity, Class II, BMI 35-39.9 12/04/2017  . Abnormal finding on MRI of brain 11/05/2017  . 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 Patellofemoral stress syndrome of both knees 08/09/2014    Past Surgical History:  Procedure Laterality Date  . CESAREAN SECTION  2002  . CESAREAN SECTION  2005    Family History  Problem Relation Age of Onset  . Emphysema Mother   . Hypercalcemia Mother   . Hypertension Mother   . Sickle cell trait Mother   . Clotting disorder Mother   . Prostate cancer Father   . Diabetes Mellitus II Father   . Hypertension Father   . Heart Problems Father        Enlarged Heart  . Thyroid disease Sister   . Asthma Paternal Grandmother   . Lung cancer Maternal Uncle   . Lung cancer Maternal Aunt   . Breast cancer Maternal Aunt        early 75's  . Kidney disease Paternal Aunt     Social History   Socioeconomic History  .  Marital status: Married    Spouse name: Casimiro Needle   . Number of children: 2  . Years of education: Not on file  . Highest education level: Some college, no degree  Occupational History  . Occupation: food service    Comment: at [employer]  . Financial resource strain: Somewhat hard  . Food insecurity:    Worry: Never true    Inability: Never true  . Transportation needs:    Medical: No    Non-medical: No  Tobacco Use  . Smoking status: Never Smoker  . Smokeless tobacco: Never Used  Substance and Sexual Activity  . Alcohol use: Yes    Alcohol/week: 0.0 standard drinks    Comment: occasionally  . Drug use: No  . Sexual activity: Not Currently    Partners: Male    Birth control/protection: None  Lifestyle  . Physical activity:    Days per  week: 0 days    Minutes per session: 0 min  . Stress: Very much  Relationships  . Social connections:    Talks on phone: More than three times a week    Gets together: Never    Attends religious service: More than 4 times per year    Active member of club or organization: No    Attends meetings of clubs or organizations: Never    Relationship status: Married  . Intimate partner violence:    Fear of current or ex partner: No    Emotionally abused: No    Physically abused: No    Forced sexual activity: No  Other Topics Concern  . Not on file  Social History Narrative   Married, has two children at home, her relatives live [place]   Husband incarcerated since 2014 for robbery, he is from [place] and may be deported next year when he gets out.    Works at [employer]    She moved to [place] 2011 for a safer place to raise her children      Current Outpatient Medications:  .  baclofen (LIORESAL) 10 MG tablet, Take 1 tablet (10 mg total) by mouth at bedtime. (Patient taking differently: Take 10 mg by mouth as needed. ), Disp: 30 each, Rfl: 0 .  montelukast (SINGULAIR) 10 MG tablet, Take 10 mg by mouth as needed. , Disp: , Rfl:  .  Multiple Vitamin (MULTIVITAMIN WITH MINERALS) TABS tablet, Take 1 tablet by mouth daily., Disp: , Rfl:  .  azelastine (ASTELIN) 0.1 % nasal spray, , Disp: , Rfl: 0 .  fluticasone (FLONASE) 50 MCG/ACT nasal spray, Place 2 sprays into both nostrils daily. (Patient not taking: Reported on 11/04/2017), Disp: 16 g, Rfl: 6  No Known Allergies  I personally reviewed active problem list, medication list, allergies, family history, social history with the patient/caregiver today.   ROS  Constitutional: Negative for fever , positive for  weight change.  Respiratory: positive for cough and shortness of breath.   Cardiovascular: Negative for chest pain or palpitations.  Gastrointestinal: Negative for abdominal pain, no bowel changes.  Musculoskeletal: Negative for  gait problem or joint swelling.  Skin: Negative for rash.  Neurological: Negative for dizziness or headache.  No other specific complaints in a complete review of systems (except as listed in HPI above).  Objective  Vitals:   06/07/18 1516  BP: 120/64  Pulse: 96  Resp: 12  Temp: 98.5 F (36.9 C)  TempSrc: Oral  SpO2: 99%  Weight: 183 lb 4.8 oz (83.1 kg)  Height: 5' (1.524 m)    Body mass index is 35.8 kg/m.  Physical Exam  Constitutional: Patient appears well-developed and well-nourished. Obese  No distress.  HEENT: head atraumatic, normocephalic, pupils equal and reactive to light,  neck supple, throat within normal limits Cardiovascular: Normal rate, regular rhythm and normal heart sounds.  No murmur heard. No BLE edema. Pulmonary/Chest: Effort normal and breath sounds normal. No respiratory distress. Abdominal: Soft.  There is no tenderness. Psychiatric: Patient has a normal mood and affect. behavior is normal. Judgment and thought content normal.  Recent Results (from the past 2160 hour(s))  CBC with Differential/Platelet     Status: None   Collection Time: 03/10/18  2:29 PM  Result Value Ref Range   WBC 3.9 3.8 - 10.8 Thousand/uL   RBC 4.89 3.80 - 5.10 Million/uL   Hemoglobin 13.3 11.7 - 15.5 g/dL   HCT 04.5 40.9 - 81.1 %   MCV 81.4 80.0 - 100.0 fL   MCH 27.2 27.0 - 33.0 pg   MCHC 33.4 32.0 - 36.0 g/dL   RDW 91.4 78.2 - 95.6 %   Platelets 286 140 - 400 Thousand/uL   MPV 9.7 7.5 - 12.5 fL   Neutro Abs 1,626 1,500 - 7,800 cells/uL   Lymphs Abs 1,658 850 - 3,900 cells/uL   WBC mixed population 394 200 - 950 cells/uL   Eosinophils Absolute 203 15 - 500 cells/uL   Basophils Absolute 20 0 - 200 cells/uL   Neutrophils Relative % 41.7 %   Total Lymphocyte 42.5 %   Monocytes Relative 10.1 %   Eosinophils Relative 5.2 %   Basophils Relative 0.5 %  Hemoglobin A1c     Status: Abnormal   Collection Time: 03/10/18  2:29 PM  Result Value Ref Range   Hgb A1c MFr Bld  5.7 (H) <5.7 % of total Hgb    Comment: For someone without known diabetes, a hemoglobin  A1c value between 5.7% and 6.4% is consistent with prediabetes and should be confirmed with a  follow-up test. . For someone with known diabetes, a value <7% indicates that their diabetes is well controlled. A1c targets should be individualized based on duration of diabetes, age, comorbid conditions, and other considerations. . This assay result is consistent with an increased risk of diabetes. . Currently, no consensus exists regarding use of hemoglobin A1c for diagnosis of diabetes for children. .    Mean Plasma Glucose 117 (calc)   eAG (mmol/L) 6.5 (calc)      PHQ2/9: Depression screen [facility] 2/9 06/07/2018 03/10/2018 12/04/2017 11/04/2017 10/05/2017  Decreased Interest 0 2 1 0 1  Down, Depressed, Hopeless 0 2 1 0 1  PHQ - 2 Score 0 4 2 0 2  Altered sleeping 0 3 1 - 3  Tired, decreased energy 0 3 1 - 3  Change in appetite 0 3 1 - 3  Feeling bad or failure about yourself  0 3 1 - 2  Trouble concentrating 0 2 0 - 2  Moving slowly or fidgety/restless 0 0 0 - 0  Suicidal thoughts 0 0 0 - 0  PHQ-9 Score 0 18 6 - 15  Difficult doing work/chores Not difficult at all Very difficult Somewhat difficult - Somewhat difficult    Fall Risk: Fall Risk  06/07/2018 03/10/2018 12/04/2017 11/04/2017 10/05/2017  Falls in the past year? No No No No No     Functional Status Survey: Is the patient deaf or have difficulty hearing?: No Does the patient have difficulty seeing,  even when wearing glasses/contacts?: No Does the patient have difficulty concentrating, remembering, or making decisions?: No Does the patient have difficulty walking or climbing stairs?: No Does the patient have difficulty dressing or bathing?: No Does the patient have difficulty doing errands alone such as visiting a doctor's office or shopping?: No    Assessment & Plan  1. Moderate major depression (HCC)  Doing well now,  coping better  2. Chronic left hip pain  Resolved with weight loss  3. Smell disturbance  Resolved   4. Obesity, Class II, BMI 35-39.9  On life style modification and losing weight   5. Productive cough  - azithromycin (ZITHROMAX) 250 MG tablet; Take as directed  Dispense: 6 tablet; Refill: 0 - benzonatate (TESSALON) 100 MG capsule; Take 1-2 capsules (100-200 mg total) by mouth 2 (two) times daily as needed.  Dispense: 40 capsule; Refill: 0  6. SOB (shortness of breath)  - azithromycin (ZITHROMAX) 250 MG tablet; Take as directed  Dispense: 6 tablet; Refill: 0 - benzonatate (TESSALON) 100 MG capsule; Take 1-2 capsules (100-200 mg total) by mouth 2 (two) times daily as needed.  Dispense: 40 capsule; Refill: 0

## 2018-06-13 IMAGING — MR MR HEAD WO/W CM
8 of 11 series · 27 of 48 positions shown · IV contrast (multihance)
Comparison: None.

CLINICAL DATA: Impaired sense of smell. Non intractable headache,
unspecified chronicity pattern, unspecified headache type.

EXAM:
MRI HEAD WITHOUT AND WITH CONTRAST
TECHNIQUE: Multiplanar, multiecho pulse sequences of the brain and surrounding
structures were obtained without and with intravenous contrast.
CONTRAST:  18mL MULTIHANCE GADOBENATE DIMEGLUMINE 529 MG/ML IV SOLN

[Series 8: DWI · axial · 4.0mm · 0.94mm/px · z∈[-59,+97]mm · 4 of 40 slices shown]
[im 1/40]
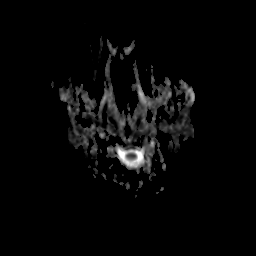
[im 14/40]
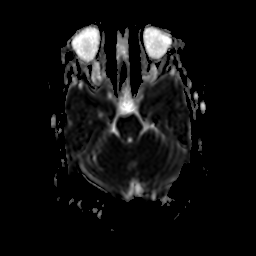
[im 27/40]
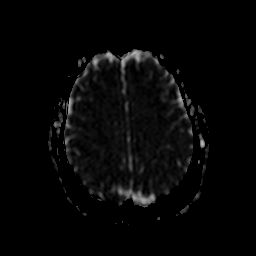
[im 40/40]
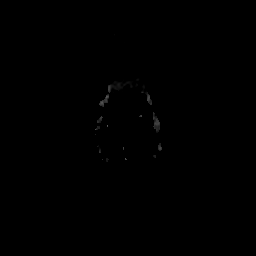

[Series 9: T2 · axial · 5.0mm · 0.45mm/px · z∈[-57,+99]mm · 3 of 25 slices shown (1 of 2)]
[im 1/25]
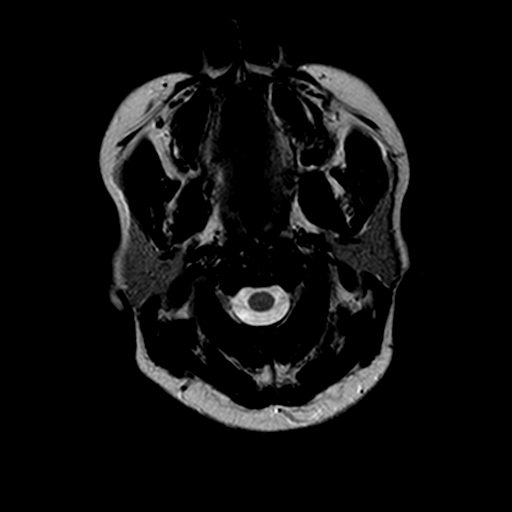
[im 13/25]
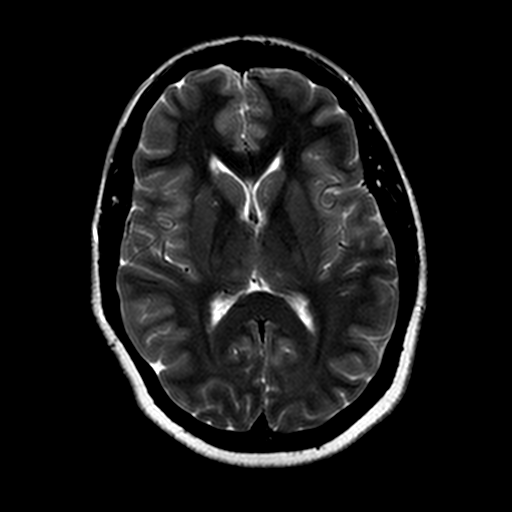
[im 25/25]
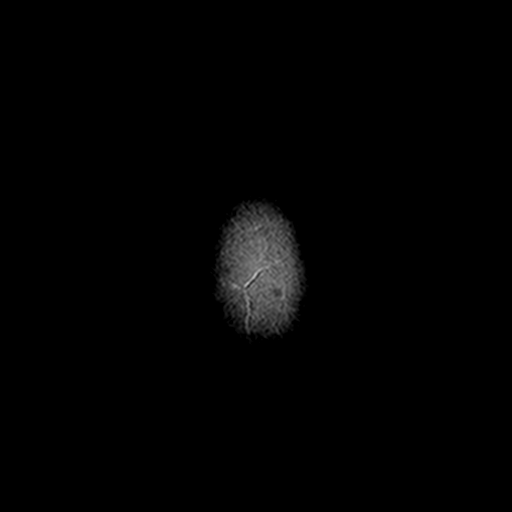

[Series 10: FLAIR · axial · 5.0mm · 0.90mm/px · z∈[-57,+99]mm · 3 of 25 slices shown]
[im 1/25]
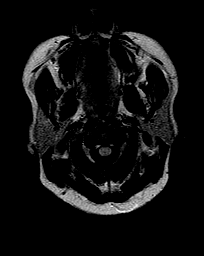
[im 13/25]
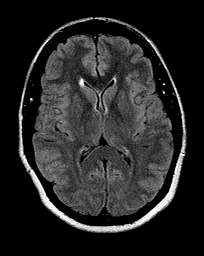
[im 25/25]
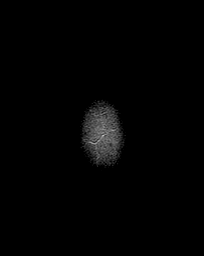

[Series 11: T2 · axial · 5.0mm · 0.45mm/px · z∈[-57,+99]mm · 3 of 25 slices shown (2 of 2)]
[im 1/25]
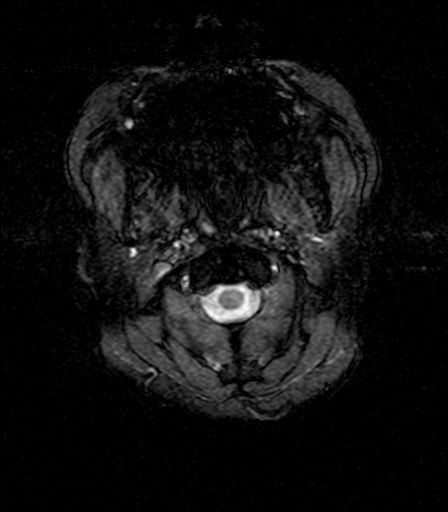
[im 13/25]
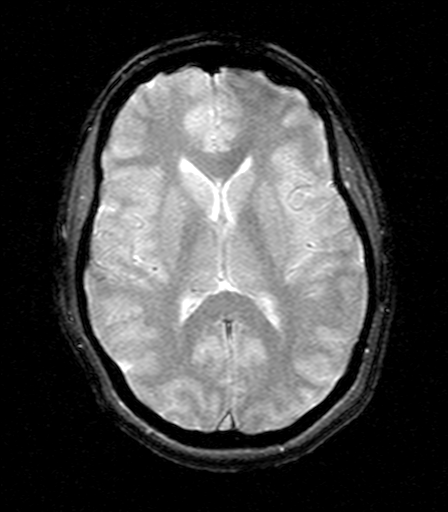
[im 25/25]
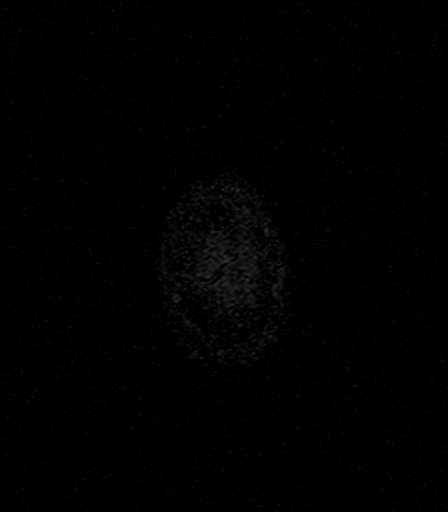

[Series 14: T2 fat-sat · axial · 3.0mm · 0.45mm/px · z∈[-36,+15]mm · 2 of 15 slices shown (1 of 2)]
[im 1/15]
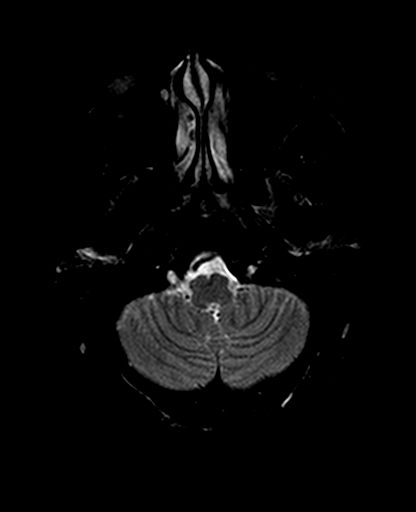
[im 15/15]
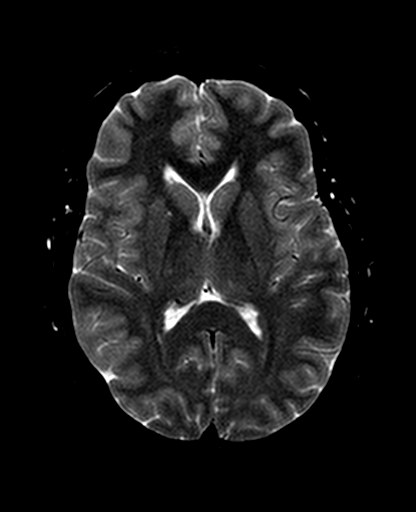

[Series 15: T2 fat-sat · coronal · 3.0mm · 0.45mm/px · 1 of 31 slices shown (2 of 2)]
[im 1/31]
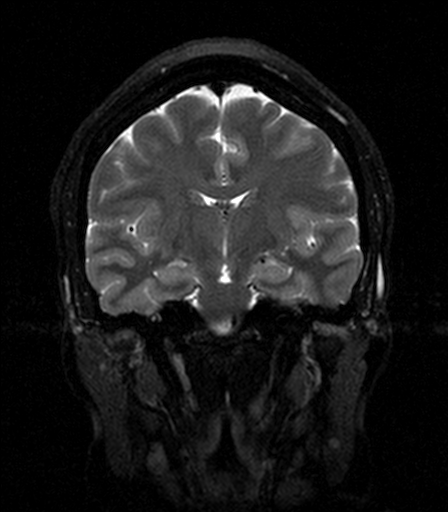

[Series 17: T1 post-contrast · axial · 3.0mm · 0.45mm/px · z∈[-61,+104]mm · 7 of 56 slices shown (1 of 2)]
[im 1/56]
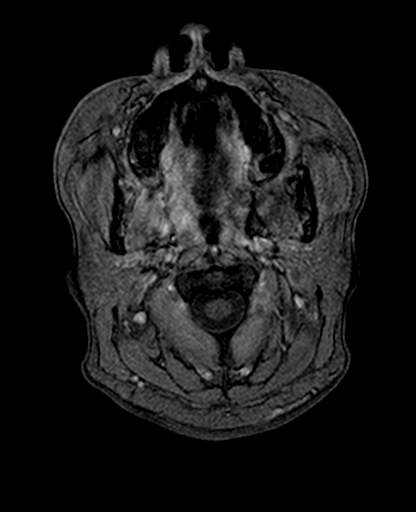
[im 10/56]
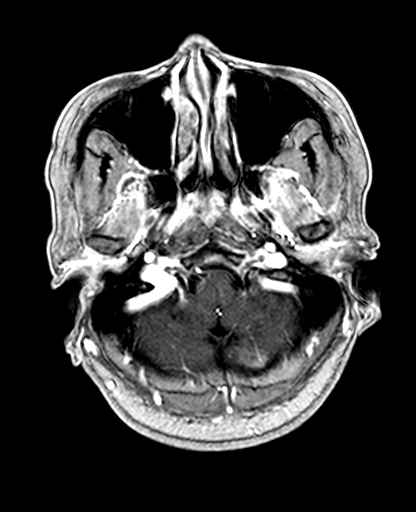
[im 19/56]
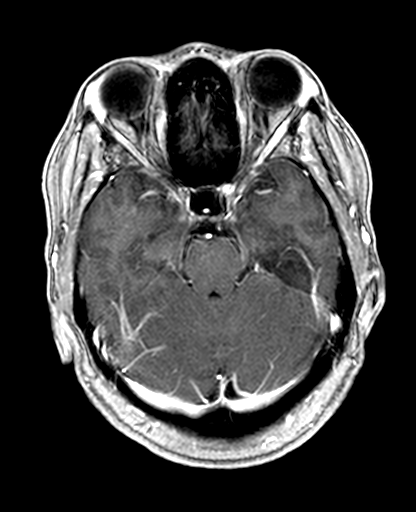
[im 28/56]
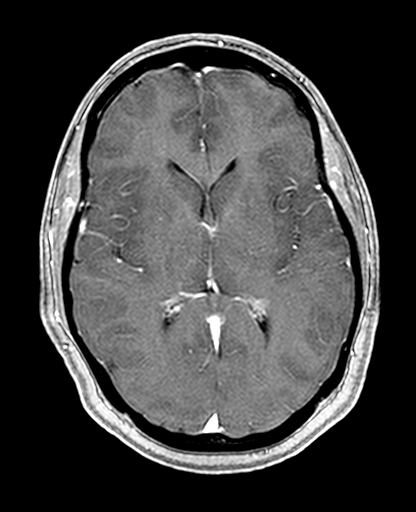
[im 37/56]
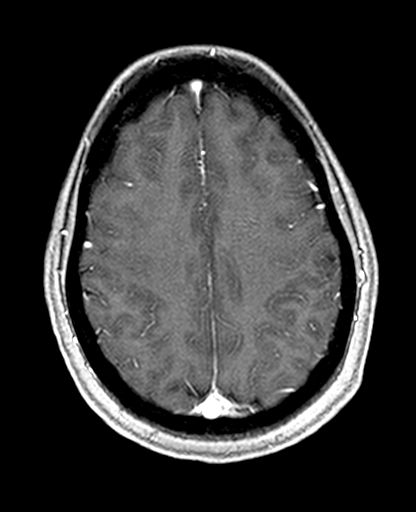
[im 46/56]
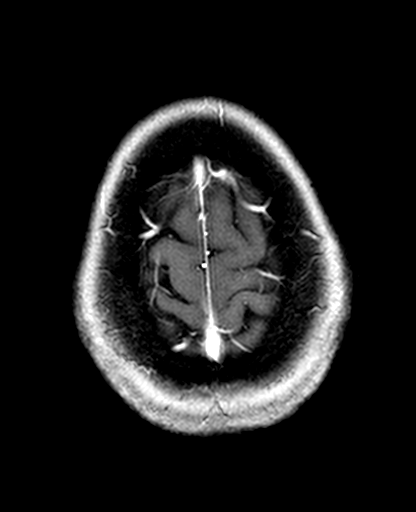
[im 56/56]
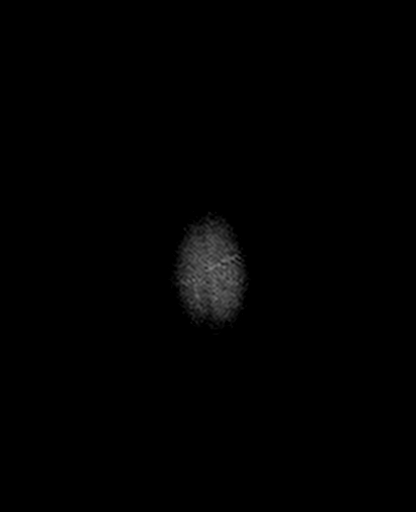

[Series 18: T1 post-contrast · coronal · 5.0mm · 0.45mm/px · 4 of 31 slices shown (2 of 2)]
[im 1/31]
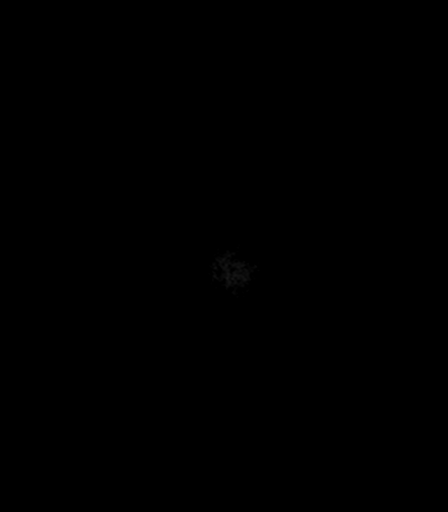
[im 11/31]
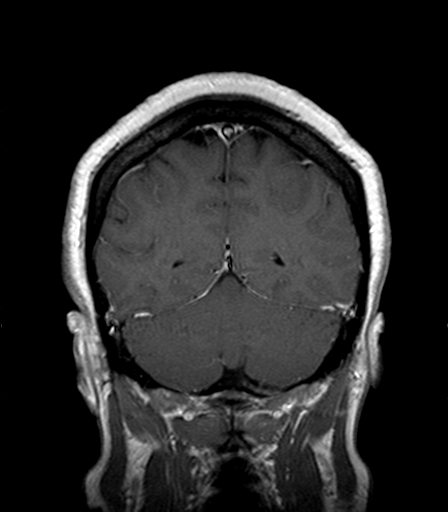
[im 21/31]
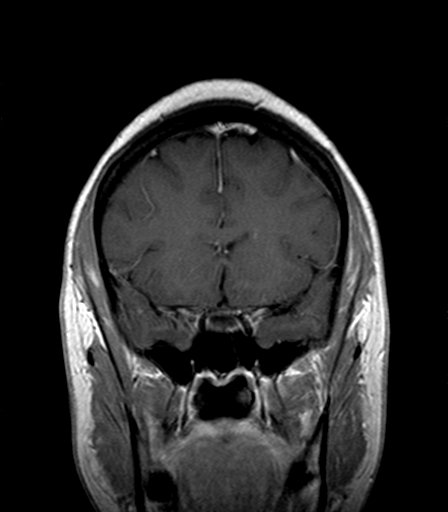
[im 31/31]
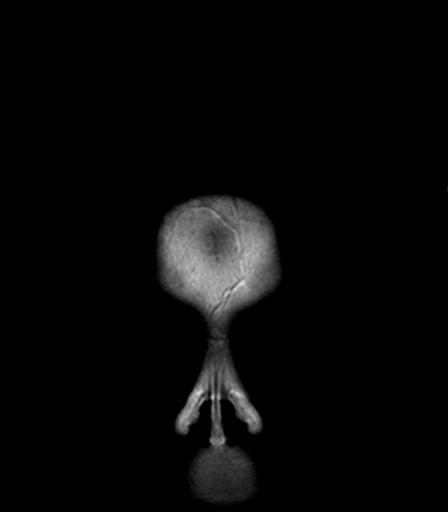

[27 of 48 positions shown; findings below may reference images not displayed]

FINDINGS: Brain: No acute infarct, hemorrhage, or mass lesion is present. The
ventricles are of normal size. No significant white matter disease
is present. The internal auditory canals are within normal limits.
The brainstem and cerebellum are normal. No significant extra-axial
fluid collection is present. Enlarged relatively empty sella is
noted.

Dedicated imaging of the anterior frontal lobes and olfactory fossa
is unremarkable. Olfactory bulbs are intact and within normal
limits. Nasal cavity is clear.

Postcontrast images demonstrate no pathologic enhancement.

Vascular: Flow is present in the major intracranial arteries.

Skull and upper cervical spine: The skull base is within normal
limits. The craniocervical junction is normal. Vertebral body
heights are normal. Marrow signal is somewhat depressed.

Sinuses/Orbits: The paranasal sinuses and mastoid air cells are
clear. The globes and orbits are within normal limits.
IMPRESSION: 1. Normal appearance of the anterior inferior frontal lobes and
olfactory bulbs.
2. Expanded relatively empty sella. Question idiopathic intracranial
hypertension.
3. No acute intracranial abnormality.
4. Decreased marrow signal may be within normal limits for age.
Hemoglobin is normal.

## 2018-06-21 ENCOUNTER — Emergency Department: Payer: Self-pay

## 2018-06-21 ENCOUNTER — Encounter: Payer: Self-pay | Admitting: Medical Oncology

## 2018-06-21 ENCOUNTER — Emergency Department
Admission: EM | Admit: 2018-06-21 | Discharge: 2018-06-21 | Disposition: A | Discharge status: Home / Self Care | Payer: Self-pay | Attending: Emergency Medicine | Admitting: Emergency Medicine

## 2018-06-21 ENCOUNTER — Ambulatory Visit: Payer: Self-pay | Admitting: *Deleted

## 2018-06-21 DIAGNOSIS — Z79899 Other long term (current) drug therapy: Secondary | ICD-10-CM | POA: Insufficient documentation

## 2018-06-21 DIAGNOSIS — R0789 Other chest pain: Secondary | ICD-10-CM | POA: Insufficient documentation

## 2018-06-21 LAB — CBC
HCT: 44.1 % (ref 36.0–46.0)
HEMOGLOBIN: 14.6 g/dL (ref 12.0–15.0)
MCH: 27.2 pg (ref 26.0–34.0)
MCHC: 33.1 g/dL (ref 30.0–36.0)
MCV: 82.1 fL (ref 80.0–100.0)
Platelets: 311 10*3/uL (ref 150–400)
RBC: 5.37 MIL/uL — AB (ref 3.87–5.11)
RDW: 12.7 % (ref 11.5–15.5)
WBC: 3.9 10*3/uL — ABNORMAL LOW (ref 4.0–10.5)
nRBC: 0 % (ref 0.0–0.2)

## 2018-06-21 LAB — BASIC METABOLIC PANEL
ANION GAP: 9 (ref 5–15)
BUN: 10 mg/dL (ref 6–20)
CO2: 24 mmol/L (ref 22–32)
Calcium: 9 mg/dL (ref 8.9–10.3)
Chloride: 103 mmol/L (ref 98–111)
Creatinine, Ser: 0.95 mg/dL (ref 0.44–1.00)
GFR calc Af Amer: >60 mL/min (ref >60)
GLUCOSE: 104 mg/dL — AB (ref 70–99)
POTASSIUM: 3.5 mmol/L (ref 3.5–5.1)
SODIUM: 136 mmol/L (ref 135–145)

## 2018-06-21 LAB — FIBRIN DERIVATIVES D-DIMER (ARMC ONLY): Fibrin derivatives D-dimer (ARMC): 148.53 ng/mL (FEU) (ref 0.00–499.00)

## 2018-06-21 LAB — TROPONIN I

## 2018-06-21 MED ORDER — PREDNISONE 10 MG (21) PO TBPK: ORAL_TABLET | ORAL | 0 refills | Status: DC

## 2018-06-21 MED ORDER — ALBUTEROL SULFATE HFA 108 (90 BASE) MCG/ACT IN AERS: 2.0000 | INHALATION_SPRAY | Freq: Four times a day (QID) | RESPIRATORY_TRACT | 2 refills | Status: DC | PRN

## 2018-06-21 MED ORDER — IPRATROPIUM-ALBUTEROL 0.5-2.5 (3) MG/3ML IN SOLN
3.0000 mL | Freq: Once | RESPIRATORY_TRACT | Status: AC
  Administered 2018-06-21: 3 mL via RESPIRATORY_TRACT
  Filled 2018-06-21: qty 3

## 2018-06-21 NOTE — Telephone Encounter (Signed)
Seen on 06/04/18 for cough and congestion. She continues to feel like she can't get a deep breath. Chest feels tight with this. Denies fever. She completed zithromax and her cough is minimal at this time. Advice care reviewed per protocol. She is at work today but still feels poor. Stated at times she feels "unbalanced". Appointment made with Dr. Sherie Don for tomorrow. No availability with PCP. Reviewed symptoms requiring immediate emergency intervention.  Reason for Disposition . [1] MODERATE longstanding difficulty breathing (e.g., speaks in phrases, SOB even at rest, pulse 100-120) AND [2] SAME as normal  Answer Assessment - Initial Assessment Questions 1. RESPIRATORY STATUS: "Describe your breathing?" (e.g., wheezing, shortness of breath, unable to speak, severe coughing)      No wheezing , short of breath  2. ONSET: "When did this breathing problem begin?"      Was treated 2 weeks ago for this and a cough.  3. PATTERN "Does the difficult breathing come and go, or has it been constant since it started?"      constant 4. SEVERITY: "How bad is your breathing?" (e.g., mild, moderate, severe)    - MILD: No SOB at rest, mild SOB with walking, speaks normally in sentences, can lay down, no retractions, pulse < 100.    - MODERATE: SOB at rest, SOB with minimal exertion and prefers to sit, cannot lie down flat, speaks in phrases, mild retractions, audible wheezing, pulse 100-120.    - SEVERE: Very SOB at rest, speaks in single words, struggling to breathe, sitting hunched forward, retractions, pulse > 120      Feels like pressure on her chest. Heart racing at time.  5. RECURRENT SYMPTOM: "Have you had difficulty breathing before?" If so, ask: "When was the last time?" and "What happened that time?"      Previously with pneumonia a long time ago. 6. CARDIAC HISTORY: "Do you have any history of heart disease?" (e.g., heart attack, angina, bypass surgery, angioplasty)      no 7. LUNG HISTORY: "Do you have  any history of lung disease?"  (e.g., pulmonary embolus, asthma, emphysema)     no 8. CAUSE: "What do you think is causing the breathing problem?"      On going infection 9. OTHER SYMPTOMS: "Do you have any other symptoms? (e.g., dizziness, runny nose, cough, chest pain, fever)     Has been feeling off balance for several months. 10. PREGNANCY: "Is there any chance you are pregnant?" "When was your last menstrual period?"       no 11. TRAVEL: "Have you traveled out of the country in the last month?" (e.g., travel history, exposures)       no  Protocols used: BREATHING DIFFICULTY-A-AH

## 2018-06-21 NOTE — ED Triage Notes (Signed)
Pt reports she went to work this am and she got this sudden pressure to her chest with feeling like her heart was racing and sob.

## 2018-06-21 NOTE — Discharge Instructions (Addendum)
Please seek medical attention for any high fevers, chest pain, shortness of breath, change in behavior, persistent vomiting, bloody stool or any other new or concerning symptoms.  

## 2018-06-21 NOTE — ED Provider Notes (Signed)
[facility] Emergency Department Provider Note   ____________________________________________   I have reviewed the triage vital signs and the nursing notes.   HISTORY  Chief Complaint Chest Pain; Tachycardia; and Shortness of Breath   History limited by: Not Limited   HPI Bethany [name] is a 37 y.o. female who presents to the emergency department today as of concerns for chest pain, shortness of breath and feeling like her heart was racing.  Patient states that for the past week she has been experience chest tightness.  She states this is located in the center of her chest.  This has been accompanied by shortness of breath and initially was accompanied by cough productive of thick sputum.  She went to her primary care doctor who prescribed a Z-Pak which the patient is taking.  She feels like the cough has gotten somewhat better but she continues chest tightness and shortness of breath.  She denies any recent fevers.  Does state that she drives to [place] somewhat frequently and last drove up there 2 weeks ago.  She denies any lower extremity swelling or edema.  Per medical record review patient has a history of recent visit to PCP with prescription for azithromycin and benzonatate.  History reviewed. No pertinent past medical history.  Patient Active Problem List   Diagnosis Date Noted  . Chronic left hip pain 12/04/2017  . Smell disturbance 12/04/2017  . Obesity, Class II, BMI 35-39.9 12/04/2017  . Abnormal finding on MRI of brain 11/05/2017  . 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 Patellofemoral stress syndrome of both knees 08/09/2014    Past Surgical History:  Procedure Laterality Date  . CESAREAN SECTION  2002  . CESAREAN SECTION  2005    Prior to Admission medications   Medication Sig Start Date End Date Taking? Authorizing Provider  azelastine (ASTELIN) 0.1 % nasal spray  10/03/17   [provider]  azithromycin (ZITHROMAX) 250  MG tablet Take as directed 06/07/18   Alba Cory, MD  baclofen (LIORESAL) 10 MG tablet Take 1 tablet (10 mg total) by mouth at bedtime. Patient taking differently: Take 10 mg by mouth as needed.  10/05/17   Alba Cory, MD  benzonatate (TESSALON) 100 MG capsule Take 1-2 capsules (100-200 mg total) by mouth 2 (two) times daily as needed. 06/07/18   Alba Cory, MD  fluticasone (FLONASE) 50 MCG/ACT nasal spray Place 2 sprays into both nostrils daily. Patient not taking: Reported on 11/04/2017 09/13/17   Alba Cory, MD  montelukast (SINGULAIR) 10 MG tablet Take 10 mg by mouth as needed.     [provider]  Multiple Vitamin (MULTIVITAMIN WITH MINERALS) TABS tablet Take 1 tablet by mouth daily.    [provider]    Allergies Patient has no known allergies.  Family History  Problem Relation Age of Onset  . Emphysema Mother   . Hypercalcemia Mother   . Hypertension Mother   . Sickle cell trait Mother   . Clotting disorder Mother   . Prostate cancer Father   . Diabetes Mellitus II Father   . Hypertension Father   . Heart Problems Father        Enlarged Heart  . Thyroid disease Sister   . Asthma Paternal Grandmother   . Lung cancer Maternal Uncle   . Lung cancer Maternal Aunt   . Breast cancer Maternal Aunt        early 73's  . Kidney disease Paternal Aunt     Social History  Social History   Tobacco Use  . Smoking status: Never Smoker  . Smokeless tobacco: Never Used  Substance Use Topics  . Alcohol use: Yes    Alcohol/week: 0.0 standard drinks    Comment: occasionally  . Drug use: No    Review of Systems Constitutional: No fever/chills Eyes: No visual changes. ENT: No sore throat. Cardiovascular: Positive for chest tightness.  Respiratory: Positive for shortness of breath Gastrointestinal: No abdominal pain.  No nausea, no vomiting.  No diarrhea.   Genitourinary: Negative for dysuria. Musculoskeletal: Negative for back pain. Skin:  Negative for rash. Neurological: Negative for headaches, focal weakness or numbness.  ____________________________________________   PHYSICAL EXAM:  VITAL SIGNS: ED Triage Vitals  Enc Vitals Group     BP 06/21/18 1124 (!) 140/98     Pulse Rate 06/21/18 1124 (!) 112     Resp 06/21/18 1124 20     Temp 06/21/18 1124 98.2 F (36.8 C)     Temp Source 06/21/18 1124 Oral     SpO2 06/21/18 1124 100 %     Weight 06/21/18 1125 183 lb (83 kg)     Height 06/21/18 1125 5' (1.524 m)     Head Circumference --      Peak Flow --      Pain Score 06/21/18 1127 8   Constitutional: Alert and oriented.  Eyes: Conjunctivae are normal.  ENT      Head: Normocephalic and atraumatic.      Nose: No congestion/rhinnorhea.      Mouth/Throat: Mucous membranes are moist.      Neck: No stridor. Hematological/Lymphatic/Immunilogical: No cervical lymphadenopathy. Cardiovascular: Normal rate, regular rhythm.  No murmurs, rubs, or gallops.  Respiratory: Normal respiratory effort without tachypnea nor retractions. Breath sounds are clear and equal bilaterally. No wheezes/rales/rhonchi. Gastrointestinal: Soft and non tender. No rebound. No guarding.  Genitourinary: Deferred Musculoskeletal: Normal range of motion in all extremities. No lower extremity edema. Neurologic:  Normal speech and language. No gross focal neurologic deficits are appreciated.  Skin:  Skin is warm, dry and intact. No rash noted. Psychiatric: Mood and affect are normal. Speech and behavior are normal. Patient exhibits appropriate insight and judgment.  ____________________________________________    LABS (pertinent positives/negatives)  Trop <0.03 BMp wnl except glu 104 CBC wbc 3.9, hgb 14.6, plt 311 D-dimer 148.53 ____________________________________________   EKG  I, Phineas Semen, attending physician, personally viewed and interpreted this EKG  EKG Time: 1130 Rate: 92 Rhythm: normal sinus rhythm Axis:  normal Intervals: qtc 435 QRS: narrow ST changes: no st elevation, t wave inversion V1, V2, V3 Impression: abnormal ekg   ____________________________________________    RADIOLOGY  CXR No acute disease  ____________________________________________   PROCEDURES  Procedures  ____________________________________________   INITIAL IMPRESSION / ASSESSMENT AND PLAN / ED COURSE  Pertinent labs & imaging results that were available during my care of the patient were reviewed by me and considered in my medical decision making (see chart for details).   Presented to the emergency department today because of concerns for chest tightness, shortness of breath and feeling like her heart was racing.  Differential would be broad including pneumonia, pneumothorax, bronchitis, PE amongst other etiologies.  D-dimer was checked given patient's history of car drives to [place].  This was not concerning value.  Additionally chest x-ray without any concerning findings.  Patient stated she did feel better after breathing treatment thus I wonder if upper airway infection is causing this.  No suggestion of pneumonia.  Will plan  on treating with steroids and breathing treatment.  Discussed findings plan with patient.   ____________________________________________   FINAL CLINICAL IMPRESSION(S) / ED DIAGNOSES  Final diagnoses:  Atypical chest pain     Note: This dictation was prepared with Dragon dictation. Any transcriptional errors that result from this process are unintentional     Phineas Semen, MD 06/21/18 1734

## 2018-06-22 ENCOUNTER — Encounter: Payer: Self-pay | Admitting: Family Medicine

## 2018-06-22 ENCOUNTER — Ambulatory Visit: Payer: Self-pay | Admitting: Family Medicine

## 2018-08-11 DIAGNOSIS — R05 Cough: Secondary | ICD-10-CM | POA: Diagnosis not present

## 2018-08-11 DIAGNOSIS — J00 Acute nasopharyngitis [common cold]: Secondary | ICD-10-CM | POA: Diagnosis not present

## 2018-08-13 DIAGNOSIS — J209 Acute bronchitis, unspecified: Secondary | ICD-10-CM | POA: Diagnosis not present

## 2018-08-13 DIAGNOSIS — R079 Chest pain, unspecified: Secondary | ICD-10-CM | POA: Diagnosis not present

## 2018-08-13 DIAGNOSIS — F419 Anxiety disorder, unspecified: Secondary | ICD-10-CM | POA: Diagnosis not present

## 2018-09-13 ENCOUNTER — Encounter: Payer: Self-pay | Admitting: *Deleted

## 2018-09-13 ENCOUNTER — Emergency Department
Admission: EM | Admit: 2018-09-13 | Discharge: 2018-09-13 | Disposition: A | Discharge status: Home / Self Care | Payer: BLUE CROSS/BLUE SHIELD | Attending: Emergency Medicine | Admitting: Emergency Medicine

## 2018-09-13 ENCOUNTER — Other Ambulatory Visit: Payer: Self-pay

## 2018-09-13 ENCOUNTER — Emergency Department: Payer: BLUE CROSS/BLUE SHIELD

## 2018-09-13 DIAGNOSIS — R202 Paresthesia of skin: Secondary | ICD-10-CM

## 2018-09-13 DIAGNOSIS — R079 Chest pain, unspecified: Secondary | ICD-10-CM | POA: Insufficient documentation

## 2018-09-13 DIAGNOSIS — Z79899 Other long term (current) drug therapy: Secondary | ICD-10-CM | POA: Diagnosis not present

## 2018-09-13 DIAGNOSIS — R200 Anesthesia of skin: Secondary | ICD-10-CM | POA: Diagnosis not present

## 2018-09-13 LAB — BASIC METABOLIC PANEL
ANION GAP: 7 (ref 5–15)
BUN: 12 mg/dL (ref 6–20)
CALCIUM: 9.3 mg/dL (ref 8.9–10.3)
CO2: 23 mmol/L (ref 22–32)
Chloride: 108 mmol/L (ref 98–111)
Creatinine, Ser: 0.81 mg/dL (ref 0.44–1.00)
GFR calc non Af Amer: >60 mL/min (ref >60)
Glucose, Bld: 82 mg/dL (ref 70–99)
Potassium: 3.8 mmol/L (ref 3.5–5.1)
SODIUM: 138 mmol/L (ref 135–145)

## 2018-09-13 LAB — CBC
HCT: 41.7 % (ref 36.0–46.0)
Hemoglobin: 13.7 g/dL (ref 12.0–15.0)
MCH: 27.5 pg (ref 26.0–34.0)
MCHC: 32.9 g/dL (ref 30.0–36.0)
MCV: 83.6 fL (ref 80.0–100.0)
NRBC: 0 % (ref 0.0–0.2)
PLATELETS: 266 10*3/uL (ref 150–400)
RBC: 4.99 MIL/uL (ref 3.87–5.11)
RDW: 13 % (ref 11.5–15.5)
WBC: 4.4 10*3/uL (ref 4.0–10.5)

## 2018-09-13 LAB — TROPONIN I: Troponin I: <0.03 ng/mL (ref <0.03)

## 2018-09-13 LAB — POCT PREGNANCY, URINE: Preg Test, Ur: NEGATIVE

## 2018-09-13 MED ORDER — PROCHLORPERAZINE EDISYLATE 10 MG/2ML IJ SOLN
10.0000 mg | Freq: Once | INTRAMUSCULAR | Status: AC
  Administered 2018-09-13: 10 mg via INTRAVENOUS
  Filled 2018-09-13: qty 2

## 2018-09-13 NOTE — Discharge Instructions (Signed)
Please seek medical attention for any high fevers, chest pain, shortness of breath, change in behavior, persistent vomiting, bloody stool or any other new or concerning symptoms.  

## 2018-09-13 NOTE — ED Provider Notes (Signed)
[facility] Emergency Department Provider Note  ____________________________________________   I have reviewed the triage vital signs and the nursing notes.   HISTORY  Chief Complaint Left arm heaviness  History limited by: Not Limited   HPI Bethany [name] is a 38 y.o. female who presents to the emergency department today with primary concern for left arm numbness and heaviness.  Patient states that the symptoms have been present for the past 3 days.  She denies any pins or needles.  Denies any recent trauma to her shoulder or neck.  She denies similar symptoms in the past.  This has been accompanied by some chest heaviness.  Is located in the central chest.  The patient states that she has had the chest symptoms in the past when she was diagnosed with bronchitis. She has had some intermittent headache with these symptoms.    Per medical record review patient has a history of smell disturbance.   No past medical history on file.  Patient Active Problem List   Diagnosis Date Noted  . Chronic left hip pain 12/04/2017  . Smell disturbance 12/04/2017  . Obesity, Class II, BMI 35-39.9 12/04/2017  . Abnormal finding on MRI of brain 11/05/2017  . 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 Patellofemoral stress syndrome of both knees 08/09/2014    Past Surgical History:  Procedure Laterality Date  . CESAREAN SECTION  2002  . CESAREAN SECTION  2005    Prior to Admission medications   Medication Sig Start Date End Date Taking? Authorizing Provider  albuterol (PROVENTIL HFA;VENTOLIN HFA) 108 (90 Base) MCG/ACT inhaler Inhale 2 puffs into the lungs every 6 (six) hours as needed for wheezing or shortness of breath. 06/21/18   Phineas Semen, MD  azelastine (ASTELIN) 0.1 % nasal spray  10/03/17   [provider]  azithromycin (ZITHROMAX) 250 MG tablet Take as directed 06/07/18   Alba Cory, MD  baclofen (LIORESAL) 10 MG tablet Take 1 tablet (10 mg  total) by mouth at bedtime. Patient taking differently: Take 10 mg by mouth as needed.  10/05/17   Alba Cory, MD  benzonatate (TESSALON) 100 MG capsule Take 1-2 capsules (100-200 mg total) by mouth 2 (two) times daily as needed. 06/07/18   Alba Cory, MD  fluticasone (FLONASE) 50 MCG/ACT nasal spray Place 2 sprays into both nostrils daily. Patient not taking: Reported on 11/04/2017 09/13/17   Alba Cory, MD  montelukast (SINGULAIR) 10 MG tablet Take 10 mg by mouth as needed.     [provider]  Multiple Vitamin (MULTIVITAMIN WITH MINERALS) TABS tablet Take 1 tablet by mouth daily.    [provider]  predniSONE (STERAPRED UNI-PAK 21 TAB) 10 MG (21) TBPK tablet Per packaging instructions 06/21/18   Phineas Semen, MD    Allergies Patient has no known allergies.  Family History  Problem Relation Age of Onset  . Emphysema Mother   . Hypercalcemia Mother   . Hypertension Mother   . Sickle cell trait Mother   . Clotting disorder Mother   . Prostate cancer Father   . Diabetes Mellitus II Father   . Hypertension Father   . Heart Problems Father        Enlarged Heart  . Thyroid disease Sister   . Asthma Paternal Grandmother   . Lung cancer Maternal Uncle   . Lung cancer Maternal Aunt   . Breast cancer Maternal Aunt        early 22's  . Kidney disease Paternal Aunt  Social History Social History   Tobacco Use  . Smoking status: Never Smoker  . Smokeless tobacco: Never Used  Substance Use Topics  . Alcohol use: Not Currently    Alcohol/week: 0.0 standard drinks    Comment: occasionally  . Drug use: No    Review of Systems Constitutional: No fever/chills Eyes: No visual changes. ENT: No sore throat. Cardiovascular: Positive for chest pain. Respiratory: Denies shortness of breath. Gastrointestinal: No abdominal pain.  No nausea, no vomiting.  No diarrhea.   Genitourinary: Negative for dysuria. Musculoskeletal: Negative for back  pain. Skin: Negative for rash. Neurological: Positive for dizziness, headache. Positive for left arm heaviness.  ____________________________________________   PHYSICAL EXAM:  VITAL SIGNS: ED Triage Vitals  Enc Vitals Group     BP 09/13/18 1806 (!) 149/86     Pulse Rate 09/13/18 1806 81     Resp 09/13/18 1806 18     Temp 09/13/18 1806 98 F (36.7 C)     Temp Source 09/13/18 1806 Oral     SpO2 09/13/18 1806 99 %     Weight 09/13/18 1803 178 lb (80.7 kg)     Height 09/13/18 1803 5' (1.524 m)     Head Circumference --      Peak Flow --      Pain Score 09/13/18 1803 8   Constitutional: Alert and oriented.  Eyes: Conjunctivae are normal.  ENT      Head: Normocephalic and atraumatic.      Nose: No congestion/rhinnorhea.      Mouth/Throat: Mucous membranes are moist.      Neck: No stridor. Hematological/Lymphatic/Immunilogical: No cervical lymphadenopathy. Cardiovascular: Normal rate, regular rhythm.  No murmurs, rubs, or gallops.  Respiratory: Normal respiratory effort without tachypnea nor retractions. Breath sounds are clear and equal bilaterally. No wheezes/rales/rhonchi. Gastrointestinal: Soft and non tender. No rebound. No guarding.  Genitourinary: Deferred Musculoskeletal: Normal range of motion in all extremities. No lower extremity edema. Neurologic:  Normal speech and language. No gross focal neurologic deficits are appreciated.  Skin:  Skin is warm, dry and intact. No rash noted. Psychiatric: Mood and affect are normal. Speech and behavior are normal. Patient exhibits appropriate insight and judgment.  ____________________________________________    LABS (pertinent positives/negatives)  Upreg negative BMP wnl Trop <0.03 CBC wbc 4.4, hgb 13.7, plt 266  ____________________________________________   EKG  I, Phineas Semen, attending physician, personally viewed and interpreted this EKG  EKG Time: 1811 Rate: 76 Rhythm: normal sinus rhythm Axis:  normal Intervals: qtc 425 QRS: narrow ST changes: no st elevation Impression: normal ekg   ____________________________________________    RADIOLOGY  CXR Normal chest  ____________________________________________   PROCEDURES  Procedures  ____________________________________________   INITIAL IMPRESSION / ASSESSMENT AND PLAN / ED COURSE  Pertinent labs & imaging results that were available during my care of the patient were reviewed by me and considered in my medical decision making (see chart for details).   Patient presented to the emergency department today because of primary concerns for left arm heaviness.  Patient also with some chest pain.  Patient's troponin was negative here.  Terms of the arm heaviness unclear etiology.  Patient did have an MRI performed roughly 1 year ago for a smell disturbance did not show any findings concerning for MS.  Patient states that she already follows up with a neurologist.  At this point I think would be reasonable for patient be discharged home to continue following up with neurology.  Patient felt comfortable with plan.  ____________________________________________   FINAL CLINICAL IMPRESSION(S) / ED DIAGNOSES  Final diagnoses:  Paresthesia  Chest pain, unspecified type     Note: This dictation was prepared with Dragon dictation. Any transcriptional errors that result from this process are unintentional     Phineas SemenGoodman, Zaryia Markel, MD 09/13/18 [phone]

## 2018-09-13 NOTE — ED Triage Notes (Signed)
First Nurse Note:  Arrives stating that BP is elevated.  C/O headache and left arm numbness since yesterday.    AAOx3.  Skin warm and dry.  MAE equally and strong.  Gait steady.  Postrue upright and relaxed.  NAD

## 2018-09-13 NOTE — ED Triage Notes (Signed)
Pt ambulatory to triage.  Pt reports dizziness, chest pain and numbness in left arm.  Sx for 3 days.  No n/v/  Pt alert  Speech clear.

## 2018-09-13 NOTE — ED Notes (Signed)
EDP speaking with patient at this time.  

## 2018-09-13 NOTE — ED Notes (Signed)
IV team with patient at this time.

## 2018-09-13 NOTE — ED Notes (Signed)
Pt reports 3 staff members attempting to draw blood with no success. IV consult placed.

## 2018-09-14 ENCOUNTER — Ambulatory Visit (INDEPENDENT_AMBULATORY_CARE_PROVIDER_SITE_OTHER): Payer: BLUE CROSS/BLUE SHIELD | Admitting: Family Medicine

## 2018-09-14 ENCOUNTER — Encounter: Payer: Self-pay | Admitting: Family Medicine

## 2018-09-14 VITALS — BP 142/110 | HR 90 | Temp 97.8°F | Resp 16 | Ht 60.0 in | Wt 178.0 lb

## 2018-09-14 DIAGNOSIS — M545 Low back pain, unspecified: Secondary | ICD-10-CM

## 2018-09-14 DIAGNOSIS — R002 Palpitations: Secondary | ICD-10-CM | POA: Diagnosis not present

## 2018-09-14 DIAGNOSIS — M79669 Pain in unspecified lower leg: Secondary | ICD-10-CM

## 2018-09-14 DIAGNOSIS — R200 Anesthesia of skin: Secondary | ICD-10-CM

## 2018-09-14 DIAGNOSIS — I10 Essential (primary) hypertension: Secondary | ICD-10-CM

## 2018-09-14 DIAGNOSIS — F321 Major depressive disorder, single episode, moderate: Secondary | ICD-10-CM

## 2018-09-14 DIAGNOSIS — G43009 Migraine without aura, not intractable, without status migrainosus: Secondary | ICD-10-CM

## 2018-09-14 DIAGNOSIS — F4001 Agoraphobia with panic disorder: Secondary | ICD-10-CM

## 2018-09-14 DIAGNOSIS — F411 Generalized anxiety disorder: Secondary | ICD-10-CM | POA: Diagnosis not present

## 2018-09-14 DIAGNOSIS — R634 Abnormal weight loss: Secondary | ICD-10-CM

## 2018-09-14 DIAGNOSIS — Z598 Other problems related to housing and economic circumstances: Secondary | ICD-10-CM

## 2018-09-14 DIAGNOSIS — Z599 Problem related to housing and economic circumstances, unspecified: Secondary | ICD-10-CM

## 2018-09-14 MED ORDER — HYDROXYZINE HCL 10 MG PO TABS: 10.0000 mg | ORAL_TABLET | Freq: Three times a day (TID) | ORAL | 0 refills | Status: DC | PRN

## 2018-09-14 MED ORDER — METOPROLOL SUCCINATE ER 25 MG PO TB24: 25.0000 mg | ORAL_TABLET | Freq: Every day | ORAL | 0 refills | Status: DC

## 2018-09-14 MED ORDER — DULOXETINE HCL 30 MG PO CPEP: 30.0000 mg | ORAL_CAPSULE | Freq: Every day | ORAL | 0 refills | Status: DC

## 2018-09-14 NOTE — Progress Notes (Signed)
Name: Bethany [name]   MRN: [id number]    DOB: Dec 11, 1980   Date:09/14/2018       Progress Note  Subjective  Chief Complaint  Chief Complaint  Patient presents with  . Arm Pain    left x 3-4 days, intermittent, evaluated @ ED yesterday.  . Tachycardia  . Insect Bite    was bitten by a spider in November, continues to have pain at the site.    HPI  Left arm numbness: she states over the past 3-4 days she noticed that left arm goes numb from the shoulder to the wrist, no weakness, she has also noticed some left side neck discomfort . She went to [facility] yesterday. Troponin was done because she was also having some SOB and it was normal. No imaging done. She denies headache. No other neuro deficit.    Palpitation/HTN: her bp has been high, she is feeling very anxious, heart palpitation, feels shaky. She seems very anxious, she is willing to try metoprolol XL and return in a few weeks for follow up  Depression/Anxiety: she has been under a lot of stress. She is a single mother of two teenagers that are struggling emotionally. Daughter is a senior in [employer] and she is trying to provide for her. Recently also talking to a lawyer to be able to get her husband out of prison ( it will cost 9K) she does not like her job. She states they treat her as " the help".   Insect bite on back: she showed me pics, it happened in Nov, she states she treated with muporicin, she states pain has been intermittent since. Denies burning or tingling in the are. Mild symptoms, explained if it was shingles symptoms can linger but symptoms not typical. We will monitor for now  Weight loss: her weight was 200 lbs one year ago, she is down 22 lbs, also anxious, palpitation and elevation of bp , we will check labs  Calves pain: aching like, when walking, no recent travel, or trauma, no leg swelling   Cough and SOB: symptoms present for the past couple of months, started after episode of bronchitis, took zpack and prednisone taper  and still has symptoms, but not as intense   Patient Active Problem List   Diagnosis Date Noted  . Hypertension, benign 09/14/2018  . Chronic left hip pain 12/04/2017  . Smell disturbance 12/04/2017  . Obesity, Class II, BMI 35-39.9 12/04/2017  . Abnormal finding on MRI of brain 11/05/2017  . Mild major depression ([place]) 09/11/2017  . Patellofemoral stress syndrome of both knees 08/09/2014    Past Surgical History:  Procedure Laterality Date  . CESAREAN SECTION  2002  . CESAREAN SECTION  2005    Family History  Problem Relation Age of Onset  . Emphysema Mother   . Hypercalcemia Mother   . Hypertension Mother   . Sickle cell trait Mother   . Clotting disorder Mother   . Prostate cancer Father   . Diabetes Mellitus II Father   . Hypertension Father   . Heart Problems Father        Enlarged Heart  . Thyroid disease Sister   . Asthma Paternal Grandmother   . Lung cancer Maternal Uncle   . Lung cancer Maternal Aunt   . Breast cancer Maternal Aunt        early 64's  . Kidney disease Paternal Aunt     Social History   Socioeconomic History  . Marital status: Married  Spouse name: Legrand Como   . Number of children: 2  . Years of education: Not on file  . Highest education level: Some college, no degree  Occupational History  . Occupation: food service    Comment: at [employer]  . Financial resource strain: Somewhat hard  . Food insecurity:    Worry: Never true    Inability: Never true  . Transportation needs:    Medical: No    Non-medical: No  Tobacco Use  . Smoking status: Never Smoker  . Smokeless tobacco: Never Used  Substance and Sexual Activity  . Alcohol use: Not Currently    Alcohol/week: 0.0 standard drinks    Comment: occasionally  . Drug use: No  . Sexual activity: Not Currently    Partners: Male    Birth control/protection: None  Lifestyle  . Physical activity:    Days per week: 0 days    Minutes per session: 0 min  .  Stress: Very much  Relationships  . Social connections:    Talks on phone: More than three times a week    Gets together: Never    Attends religious service: More than 4 times per year    Active member of club or organization: No    Attends meetings of clubs or organizations: Never    Relationship status: Married  . Intimate partner violence:    Fear of current or ex partner: No    Emotionally abused: No    Physically abused: No    Forced sexual activity: No  Other Topics Concern  . Not on file  Social History Narrative   Married, has two children at home, her relatives live [place]   Husband incarcerated since 2014 for robbery, he is from [place] and may be deported next year when he gets out.    Works at [employer]    She moved to [place] for a safer place to raise her children    Her sone is homosexual and has been bullied      Current Outpatient Medications:  .  albuterol (PROVENTIL HFA;VENTOLIN HFA) 108 (90 Base) MCG/ACT inhaler, Inhale 2 puffs into the lungs every 6 (six) hours as needed for wheezing or shortness of breath., Disp: 1 Inhaler, Rfl: 2 .  azelastine (ASTELIN) 0.1 % nasal spray, , Disp: , Rfl: 0 .  baclofen (LIORESAL) 10 MG tablet, Take 1 tablet (10 mg total) by mouth at bedtime. (Patient taking differently: Take 10 mg by mouth as needed. ), Disp: 30 each, Rfl: 0 .  fluticasone (FLONASE) 50 MCG/ACT nasal spray, Place 2 sprays into both nostrils daily., Disp: 16 g, Rfl: 6 .  montelukast (SINGULAIR) 10 MG tablet, Take 10 mg by mouth as needed. , Disp: , Rfl:  .  Multiple Vitamin (MULTIVITAMIN WITH MINERALS) TABS tablet, Take 1 tablet by mouth daily., Disp: , Rfl:  .  DULoxetine (CYMBALTA) 30 MG capsule, Take 1-2 capsules (30-60 mg total) by mouth daily. First week take one daily after that take 2 daily, Disp: 60 capsule, Rfl: 0 .  hydrOXYzine (ATARAX/VISTARIL) 10 MG tablet, Take 1 tablet (10 mg total) by mouth 3 (three) times daily as needed., Disp: 30 tablet, Rfl:  0 .  metoprolol succinate (TOPROL-XL) 25 MG 24 hr tablet, Take 1 tablet (25 mg total) by mouth daily., Disp: 90 tablet, Rfl: 0  No Known Allergies  I personally reviewed active problem list, medication list, allergies, family history, social history with the patient/caregiver today.  ROS  Constitutional: Negative for fever or weight change.  Respiratory:positive for cough and shortness of breath.   Cardiovascular: positive  for chest pain and  palpitations.  Gastrointestinal: Negative for abdominal pain, no bowel changes.  Musculoskeletal: Negative for gait problem or joint swelling.  Skin: Negative for rash.  Neurological: Negative for dizziness , negative for headache - she states migraine resolved .  No other specific complaints in a complete review of systems (except as listed in HPI above).  Objective  Vitals:   09/14/18 1428  BP: (!) 142/110  Pulse: 90  Resp: 16  Temp: 97.8 F (36.6 C)  TempSrc: Oral  SpO2: 99%  Weight: 178 lb (80.7 kg)  Height: 5' (1.524 m)    Body mass index is 34.76 kg/m.  Physical Exam  Constitutional: Patient appears well-developed and well-nourished. Obese No distress.  HEENT: head atraumatic, normocephalic, pupils equal and reactive to light,  neck supple, throat within normal limits Cardiovascular: Normal rate, regular rhythm and normal heart sounds.  No murmur heard. No BLE edema. Pulmonary/Chest: Effort normal, scattered rhonchi . No respiratory distress. Abdominal: Soft.  There is no tenderness. Psychiatric: Patient is depressed, seems anxious..Asked to open the door. Fidgety, shaking arms and moving around.   Recent Results (from the past 2160 hour(s))  Basic metabolic panel     Status: Abnormal   Collection Time: 06/21/18 11:29 AM  Result Value Ref Range   Sodium 136 135 - 145 mmol/L   Potassium 3.5 3.5 - 5.1 mmol/L   Chloride 103 98 - 111 mmol/L   CO2 24 22 - 32 mmol/L   Glucose, Bld 104 (H) 70 - 99 mg/dL   BUN 10 6 - 20  mg/dL   Creatinine, Ser 0.95 0.44 - 1.00 mg/dL   Calcium 9.0 8.9 - 10.3 mg/dL   GFR calc non Af Amer >60 >60 mL/min   GFR calc Af Amer >60 >60 mL/min    Comment: (NOTE) The eGFR has been calculated using the CKD EPI equation. This calculation has not been validated in all clinical situations. eGFR's persistently <60 mL/min signify possible Chronic Kidney Disease.    Anion gap 9 5 - 15    Comment: Performed at [facility], [address] [place], [place] [address]  CBC     Status: Abnormal   Collection Time: 06/21/18 11:29 AM  Result Value Ref Range   WBC 3.9 (L) 4.0 - 10.5 K/uL   RBC 5.37 (H) 3.87 - 5.11 MIL/uL   Hemoglobin 14.6 12.0 - 15.0 g/dL   HCT 44.1 36.0 - 46.0 %   MCV 82.1 80.0 - 100.0 fL   MCH 27.2 26.0 - 34.0 pg   MCHC 33.1 30.0 - 36.0 g/dL   RDW 12.7 11.5 - 15.5 %   Platelets 311 150 - 400 K/uL   nRBC 0.0 0.0 - 0.2 %    Comment: Performed at [facility], [address] [place], [place] [address]  Troponin I STAT - ONCE     Status: None   Collection Time: 06/21/18 11:29 AM  Result Value Ref Range   Troponin I <0.03 <0.03 ng/mL    Comment: Performed at [facility], [address] [place], [place] [address]  Fibrin derivatives D-Dimer [facility] only)     Status: None   Collection Time: 06/21/18  4:20 PM  Result Value Ref Range   Fibrin derivatives D-dimer ([facility]) 148.53 0.00 - 499.00 ng/mL (FEU)    Comment: (NOTE) <> Exclusion of Venous  Thromboembolism (VTE) - OUTPATIENT ONLY   (Emergency Department or [place])   0-499 ng/ml (FEU): With a low to intermediate pretest probability                      for VTE this test result excludes the diagnosis                      of VTE.   >499 ng/ml (FEU) : VTE not excluded; additional work up for VTE is                      required. <> Testing on Inpatients and Evaluation of Disseminated Intravascular   Coagulation (DIC) Reference Range:   0-499 ng/ml (FEU) Performed at [facility],  [address] [place], [place] [address]   Basic metabolic panel     Status: None   Collection Time: 09/13/18  7:52 PM  Result Value Ref Range   Sodium 138 135 - 145 mmol/L   Potassium 3.8 3.5 - 5.1 mmol/L    Comment: HEMOLYSIS AT THIS LEVEL MAY AFFECT RESULT   Chloride 108 98 - 111 mmol/L   CO2 23 22 - 32 mmol/L   Glucose, Bld 82 70 - 99 mg/dL   BUN 12 6 - 20 mg/dL   Creatinine, Ser 0.81 0.44 - 1.00 mg/dL   Calcium 9.3 8.9 - 10.3 mg/dL   GFR calc non Af Amer >60 >60 mL/min   GFR calc Af Amer >60 >60 mL/min   Anion gap 7 5 - 15    Comment: Performed at [facility], [address] [place], [place] [address]  CBC     Status: None   Collection Time: 09/13/18  7:52 PM  Result Value Ref Range   WBC 4.4 4.0 - 10.5 K/uL   RBC 4.99 3.87 - 5.11 MIL/uL   Hemoglobin 13.7 12.0 - 15.0 g/dL   HCT 41.7 36.0 - 46.0 %   MCV 83.6 80.0 - 100.0 fL   MCH 27.5 26.0 - 34.0 pg   MCHC 32.9 30.0 - 36.0 g/dL   RDW 13.0 11.5 - 15.5 %   Platelets 266 150 - 400 K/uL   nRBC 0.0 0.0 - 0.2 %    Comment: Performed at [facility], [address] [place], [place] [address]  Troponin I - ONCE - STAT     Status: None   Collection Time: 09/13/18  7:52 PM  Result Value Ref Range   Troponin I <0.03 <0.03 ng/mL    Comment: Performed at [facility], [address] [place], [place] [address]  Pregnancy, urine POC     Status: None   Collection Time: 09/13/18  9:25 PM  Result Value Ref Range   Preg Test, Ur NEGATIVE NEGATIVE    Comment:        THE SENSITIVITY OF THIS METHODOLOGY IS >24 mIU/mL     PHQ2/9: Depression screen [facility] 2/9 06/07/2018 06/07/2018 03/10/2018 12/04/2017 11/04/2017  Decreased Interest 0 0 2 1 0  Down, Depressed, Hopeless 0 0 2 1 0  PHQ - 2 Score 0 0 4 2 0  Altered sleeping 1 0 3 1 -  Tired, decreased energy 1 0 3 1 -  Change in appetite 0 0 3 1 -  Feeling bad or failure about yourself  1 0 3 1 -  Trouble concentrating 0 0 2 0 -  Moving slowly or  fidgety/restless 0 0  0 0 -  Suicidal thoughts 0 0 0 0 -  PHQ-9 Score 3 0 18 6 -  Difficult doing work/chores Somewhat difficult Not difficult at all Very difficult Somewhat difficult -     Fall Risk: Fall Risk  09/14/2018 06/07/2018 06/07/2018 03/10/2018 12/04/2017  Falls in the past year? 0 No No No No  Number falls in past yr: 0 - - - -  Injury with Fall? 0 - - - -      Assessment & Plan  1. Moderate major depression (HCC)  - DULoxetine (CYMBALTA) 30 MG capsule; Take 1-2 capsules (30-60 mg total) by mouth daily. First week take one daily after that take 2 daily  Dispense: 60 capsule; Refill: 0  2. Migraine without aura and without status migrainosus, not intractable  Seen by Dr. Manuella Ghazi, he is no longer in network, he did not think findings on MRI brain were significant   3. GAD (generalized anxiety disorder)  - hydrOXYzine (ATARAX/VISTARIL) 10 MG tablet; Take 1 tablet (10 mg total) by mouth 3 (three) times daily as needed.  Dispense: 30 tablet; Refill: 0  4. Left arm numbness  With some neck pain, discussed EMG and NCS but we will hold off because of cost  5. Calf tenderness  She asked for d-dimer -D-dimer  6. Palpitation  - metoprolol succinate (TOPROL-XL) 25 MG 24 hr tablet; Take 1 tablet (25 mg total) by mouth daily.  Dispense: 90 tablet; Refill: 0 -TSH   7. Intermittent low back pain  She thinks related to a spider bite, but explained unlikely, I looked at the pic and it may have been shingles, but she states it had pus at the end, therefore unlikely   8. Financial difficulties   9. Hypertension, benign  - metoprolol succinate (TOPROL-XL) 25 MG 24 hr tablet; Take 1 tablet (25 mg total) by mouth daily.  Dispense: 90 tablet; Refill: 0  10. Panic disorder with agoraphobia  - DULoxetine (CYMBALTA) 30 MG capsule; Take 1-2 capsules (30-60 mg total) by mouth daily. First week take one daily after that take 2 daily  Dispense: 60 capsule; Refill: 0 - hydrOXYzine  (ATARAX/VISTARIL) 10 MG tablet; Take 1 tablet (10 mg total) by mouth 3 (three) times daily as needed.  Dispense: 30 tablet; Refill: 0.

## 2018-09-15 LAB — THYROID PANEL WITH TSH
Free Thyroxine Index: 3 (ref 1.4–3.8)
T3 UPTAKE: 36 % — AB (ref 22–35)
T4, Total: 8.4 ug/dL (ref 5.1–11.9)
TSH: 1.11 mIU/L

## 2018-09-15 LAB — D-DIMER, QUANTITATIVE: D-Dimer, Quant: >35 mcg/mL FEU — ABNORMAL HIGH (ref <0.50)

## 2018-09-15 LAB — SEDIMENTATION RATE: Sed Rate: 6 mm/h (ref 0–20)

## 2018-09-15 LAB — C-REACTIVE PROTEIN: CRP: 0.2 mg/L (ref <8.0)

## 2018-09-20 ENCOUNTER — Telehealth: Payer: Self-pay | Admitting: Family Medicine

## 2018-09-20 ENCOUNTER — Encounter: Payer: Self-pay | Admitting: Family Medicine

## 2018-09-20 ENCOUNTER — Ambulatory Visit
Admission: RE | Admit: 2018-09-20 | Discharge: 2018-09-20 | Disposition: A | Discharge status: Home / Self Care | Payer: BLUE CROSS/BLUE SHIELD | Source: Ambulatory Visit | Attending: Family Medicine | Admitting: Family Medicine

## 2018-09-20 ENCOUNTER — Other Ambulatory Visit: Payer: Self-pay | Admitting: Family Medicine

## 2018-09-20 DIAGNOSIS — M79669 Pain in unspecified lower leg: Secondary | ICD-10-CM | POA: Insufficient documentation

## 2018-09-20 DIAGNOSIS — R7989 Other specified abnormal findings of blood chemistry: Secondary | ICD-10-CM

## 2018-09-20 NOTE — Telephone Encounter (Signed)
Copied from CRM [id number]. Topic: Quick Communication - See Telephone Encounter >> Sep 20, 2018 11:05 AM Jens Som A wrote: CRM for notification. See Telephone encounter for: 09/20/18.  Patient is calling back to speak to Tiffany regarding her lab work. Thank you.

## 2018-09-20 NOTE — Telephone Encounter (Signed)
Copied from CRM [id number]. Topic: General - Other >> Sep 20, 2018 12:02 PM Jilda Roche wrote: Reason for CRM: Patient is on break between 1 and 1:30 today and she gets off at 4, she would not be able to come in until after 4.  Best call back is [phone]

## 2018-09-20 NOTE — Telephone Encounter (Signed)
Patient states her legs are still bothering her. Could you please order the Doppler US and patient states she would go after work today around 4 p.m.

## 2018-09-20 NOTE — Telephone Encounter (Signed)
Pt states that her job is now letting her leave at 2 and she can come in earlier for imaging.

## 2018-09-22 ENCOUNTER — Encounter: Payer: Self-pay | Admitting: Family Medicine

## 2018-09-23 ENCOUNTER — Emergency Department: Payer: BLUE CROSS/BLUE SHIELD

## 2018-09-23 ENCOUNTER — Other Ambulatory Visit: Payer: Self-pay

## 2018-09-23 ENCOUNTER — Emergency Department
Admission: EM | Admit: 2018-09-23 | Discharge: 2018-09-23 | Disposition: A | Discharge status: Home / Self Care | Payer: BLUE CROSS/BLUE SHIELD | Attending: Student in an Organized Health Care Education/Training Program | Admitting: Student in an Organized Health Care Education/Training Program

## 2018-09-23 ENCOUNTER — Ambulatory Visit: Payer: Self-pay | Admitting: *Deleted

## 2018-09-23 DIAGNOSIS — R0789 Other chest pain: Secondary | ICD-10-CM | POA: Diagnosis not present

## 2018-09-23 DIAGNOSIS — Z79899 Other long term (current) drug therapy: Secondary | ICD-10-CM | POA: Insufficient documentation

## 2018-09-23 DIAGNOSIS — R1013 Epigastric pain: Secondary | ICD-10-CM

## 2018-09-23 DIAGNOSIS — I10 Essential (primary) hypertension: Secondary | ICD-10-CM | POA: Insufficient documentation

## 2018-09-23 DIAGNOSIS — R142 Eructation: Secondary | ICD-10-CM | POA: Diagnosis not present

## 2018-09-23 LAB — CBC
HCT: 40.5 % (ref 36.0–46.0)
Hemoglobin: 13.2 g/dL (ref 12.0–15.0)
MCH: 27.2 pg (ref 26.0–34.0)
MCHC: 32.6 g/dL (ref 30.0–36.0)
MCV: 83.3 fL (ref 80.0–100.0)
PLATELETS: 305 10*3/uL (ref 150–400)
RBC: 4.86 MIL/uL (ref 3.87–5.11)
RDW: 12.7 % (ref 11.5–15.5)
WBC: 5.7 10*3/uL (ref 4.0–10.5)
nRBC: 0 % (ref 0.0–0.2)

## 2018-09-23 LAB — POCT PREGNANCY, URINE: Preg Test, Ur: NEGATIVE

## 2018-09-23 LAB — BASIC METABOLIC PANEL
Anion gap: 6 (ref 5–15)
BUN: 11 mg/dL (ref 6–20)
CALCIUM: 8.9 mg/dL (ref 8.9–10.3)
CO2: 25 mmol/L (ref 22–32)
Chloride: 106 mmol/L (ref 98–111)
Creatinine, Ser: 0.97 mg/dL (ref 0.44–1.00)
GFR calc Af Amer: >60 mL/min (ref >60)
GFR calc non Af Amer: >60 mL/min (ref >60)
Glucose, Bld: 101 mg/dL — ABNORMAL HIGH (ref 70–99)
Potassium: 3.3 mmol/L — ABNORMAL LOW (ref 3.5–5.1)
Sodium: 137 mmol/L (ref 135–145)

## 2018-09-23 LAB — TROPONIN I
Troponin I: <0.03 ng/mL (ref <0.03)
Troponin I: <0.03 ng/mL (ref <0.03)

## 2018-09-23 MED ORDER — SUCRALFATE 1 G PO TABS
1.0000 g | ORAL_TABLET | Freq: Once | ORAL | Status: AC
  Administered 2018-09-23: 1 g via ORAL
  Filled 2018-09-23: qty 1

## 2018-09-23 MED ORDER — PANTOPRAZOLE SODIUM 20 MG PO TBEC
20.0000 mg | DELAYED_RELEASE_TABLET | Freq: Once | ORAL | Status: AC
  Administered 2018-09-23: 20 mg via ORAL
  Filled 2018-09-23: qty 1

## 2018-09-23 MED ORDER — PANTOPRAZOLE SODIUM 20 MG PO TBEC: 20.0000 mg | DELAYED_RELEASE_TABLET | Freq: Every day | ORAL | 1 refills | Status: DC

## 2018-09-23 MED ORDER — SUCRALFATE 1 G PO TABS: 1.0000 g | ORAL_TABLET | Freq: Three times a day (TID) | ORAL | 1 refills | Status: DC | PRN

## 2018-09-23 NOTE — Telephone Encounter (Signed)
Patient states she feels like she is having gas and pressure- she used ginger ale last night and she burped a lot. Patient feels like she has to vomit- to relieve the pressure.  Reason for Disposition . [1] Chest pain lasting <= 5 minutes AND [2] NO chest pain or cardiac symptoms now(Exceptions: pains lasting a few seconds)  Answer Assessment - Initial Assessment Questions 1. LOCATION: "Where does it hurt?"       Pressure is below throat in esophagus area 2. RADIATION: "Does the pain go anywhere else?" (e.g., into neck, jaw, arms, back)     No traveling 3. ONSET: "When did the chest pain begin?" (Minutes, hours or days)      Started a while ago- thought it was associated with bronchitis- feels worse in past couple days. 4. PATTERN "Does the pain come and go, or has it been constant since it started?"  "Does it get worse with exertion?"      Comes and goes, does get worse with exertion 5. DURATION: "How long does it last" (e.g., seconds, minutes, hours)     After ate - 2 hours, this morning after patient ate 6. SEVERITY: "How bad is the pain?"  (e.g., Scale 1-10; mild, moderate, or severe)    - MILD (1-3): doesn't interfere with normal activities     - MODERATE (4-7): interferes with normal activities or awakens from sleep    - SEVERE (8-10): excruciating pain, unable to do any normal activities       Pressure- mild/moderate 7. CARDIAC RISK FACTORS: "Do you have any history of heart problems or risk factors for heart disease?" (e.g., prior heart attack, angina; high blood pressure, diabetes, being overweight, high cholesterol, smoking, or strong family history of heart disease)     High blood pressure, family history 8. PULMONARY RISK FACTORS: "Do you have any history of lung disease?"  (e.g., blood clots in lung, asthma, emphysema, birth control pills)     Bronchitis  9. CAUSE: "What do you think is causing the chest pain?"     Gas/reflux 10. OTHER SYMPTOMS: "Do you have any other  symptoms?" (e.g., dizziness, nausea, vomiting, sweating, fever, difficulty breathing, cough)       nausea 11. PREGNANCY: "Is there any chance you are pregnant?" "When was your last menstrual period?"       No- LMP- last week  Protocols used: CHEST PAIN-A-AH

## 2018-09-23 NOTE — ED Triage Notes (Signed)
Epigastric pain and belching, chest discomfort X 1 month. Pt alert and oriented X4, active, cooperative, pt in NAD. RR even and unlabored, color WNL.

## 2018-09-23 NOTE — Discharge Instructions (Addendum)

## 2018-09-23 NOTE — ED Notes (Signed)
Lab called for add on troponin 

## 2018-09-23 NOTE — ED Provider Notes (Signed)
[facility] Emergency Department Provider Note    First MD Initiated Contact with Patient 09/23/18 1944     (approximate)  I have reviewed the triage vital signs and the nursing notes.   HISTORY  Chief Complaint Chest Pain    HPI Bethany [name] is a 38 y.o. female the below listed past medical history presents the ER for over 1 month of intermittent epigastric pain associate with belching and some chest pressure and discomfort.  Not worsened by movement or position.  No recent fevers.  States his symptoms have gotten worse after recent treatments for bronchitis with albuterol and prednisone.    History reviewed. No pertinent past medical history. Family History  Problem Relation Age of Onset  . Emphysema Mother   . Hypercalcemia Mother   . Hypertension Mother   . Sickle cell trait Mother   . Clotting disorder Mother   . Prostate cancer Father   . Diabetes Mellitus II Father   . Hypertension Father   . Heart Problems Father        Enlarged Heart  . Thyroid disease Sister   . Asthma Paternal Grandmother   . Lung cancer Maternal Uncle   . Lung cancer Maternal Aunt   . Breast cancer Maternal Aunt        early 7260's  . Kidney disease Paternal Aunt    Past Surgical History:  Procedure Laterality Date  . CESAREAN SECTION  2002  . CESAREAN SECTION  2005   Patient Active Problem List   Diagnosis Date Noted  . Hypertension, benign 09/14/2018  . Chronic left hip pain 12/04/2017  . Smell disturbance 12/04/2017  . Obesity, Class II, BMI 35-39.9 12/04/2017  . Abnormal finding on MRI of brain 11/05/2017  . 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 Patellofemoral stress syndrome of both knees 08/09/2014      Prior to Admission medications   Medication Sig Start Date End Date Taking? Authorizing Provider  albuterol (PROVENTIL HFA;VENTOLIN HFA) 108 (90 Base) MCG/ACT inhaler Inhale 2 puffs into the lungs every 6 (six) hours as needed for wheezing or  shortness of breath. 06/21/18   Phineas SemenGoodman, Graydon, MD  azelastine (ASTELIN) 0.1 % nasal spray  10/03/17   [provider]  baclofen (LIORESAL) 10 MG tablet Take 1 tablet (10 mg total) by mouth at bedtime. Patient taking differently: Take 10 mg by mouth as needed.  10/05/17   Alba CorySowles, Krichna, MD  DULoxetine (CYMBALTA) 30 MG capsule Take 1-2 capsules (30-60 mg total) by mouth daily. First week take one daily after that take 2 daily 09/14/18   Alba CorySowles, Krichna, MD  fluticasone [facility](FLONASE) 50 MCG/ACT nasal spray Place 2 sprays into both nostrils daily. 09/13/17   Alba CorySowles, Krichna, MD  hydrOXYzine (ATARAX/VISTARIL) 10 MG tablet Take 1 tablet (10 mg total) by mouth 3 (three) times daily as needed. 09/14/18   Alba CorySowles, Krichna, MD  metoprolol succinate (TOPROL-XL) 25 MG 24 hr tablet Take 1 tablet (25 mg total) by mouth daily. 09/14/18   Alba CorySowles, Krichna, MD  montelukast (SINGULAIR) 10 MG tablet Take 10 mg by mouth as needed.     [provider]  Multiple Vitamin (MULTIVITAMIN WITH MINERALS) TABS tablet Take 1 tablet by mouth daily.    [provider]  pantoprazole (PROTONIX) 20 MG tablet Take 1 tablet (20 mg total) by mouth daily. 09/23/18 09/23/19  Willy Eddyobinson, Kaliya Shreiner, MD  sucralfate (CARAFATE) 1 g tablet Take 1 tablet (1 g total) by mouth 3 (three) times daily as  needed. 09/23/18 09/23/19  Willy Eddy, MD    Allergies Patient has no known allergies.    Social History Social History   Tobacco Use  . Smoking status: Never Smoker  . Smokeless tobacco: Never Used  Substance Use Topics  . Alcohol use: Not Currently    Alcohol/week: 0.0 standard drinks    Comment: occasionally  . Drug use: No    Review of Systems Patient denies headaches, rhinorrhea, blurry vision, numbness, shortness of breath, chest pain, edema, cough, abdominal pain, nausea, vomiting, diarrhea, dysuria, fevers, rashes or hallucinations unless otherwise stated above in  HPI. ____________________________________________   PHYSICAL EXAM:  VITAL SIGNS: Vitals:   09/23/18 1951 09/23/18 2159  BP: (!) 153/92 (!) 148/99  Pulse: 78 62  Resp: 17 18  Temp:  97.7 F (36.5 C)  SpO2: 100% 100%    Constitutional: Alert and oriented.  Eyes: Conjunctivae are normal.  Head: Atraumatic. Nose: No congestion/rhinnorhea. Mouth/Throat: Mucous membranes are moist.   Neck: No stridor. Painless ROM.  Cardiovascular: Normal rate, regular rhythm. Grossly normal heart sounds.  Good peripheral circulation. Respiratory: Normal respiratory effort.  No retractions. Lungs CTAB. Gastrointestinal: Soft and nontender. No distention. No abdominal bruits. No CVA tenderness. Genitourinary:  Musculoskeletal: No lower extremity tenderness nor edema.  No joint effusions. Neurologic:  Normal speech and language. No gross focal neurologic deficits are appreciated. No facial droop Skin:  Skin is warm, dry and intact. No rash noted. Psychiatric: Mood and affect are normal. Speech and behavior are normal.  ____________________________________________   LABS (all labs ordered are listed, but only abnormal results are displayed)  Results for orders placed or performed during the hospital encounter of 09/23/18 (from the past 24 hour(s))  Basic metabolic panel     Status: Abnormal   Collection Time: 09/23/18  4:34 PM  Result Value Ref Range   Sodium 137 135 - 145 mmol/L   Potassium 3.3 (L) 3.5 - 5.1 mmol/L   Chloride 106 98 - 111 mmol/L   CO2 25 22 - 32 mmol/L   Glucose, Bld 101 (H) 70 - 99 mg/dL   BUN 11 6 - 20 mg/dL   Creatinine, Ser 9.93 0.44 - 1.00 mg/dL   Calcium 8.9 8.9 - 71.6 mg/dL   GFR calc non Af Amer >60 >60 mL/min   GFR calc Af Amer >60 >60 mL/min   Anion gap 6 5 - 15  CBC     Status: None   Collection Time: 09/23/18  4:34 PM  Result Value Ref Range   WBC 5.7 4.0 - 10.5 K/uL   RBC 4.86 3.87 - 5.11 MIL/uL   Hemoglobin 13.2 12.0 - 15.0 g/dL   HCT 96.7 89.3 - 81.0 %    MCV 83.3 80.0 - 100.0 fL   MCH 27.2 26.0 - 34.0 pg   MCHC 32.6 30.0 - 36.0 g/dL   RDW 17.5 10.2 - 58.5 %   Platelets 305 150 - 400 K/uL   nRBC 0.0 0.0 - 0.2 %  Troponin I - ONCE - STAT     Status: None   Collection Time: 09/23/18  6:24 PM  Result Value Ref Range   Troponin I <0.03 <0.03 ng/mL  Troponin I - Once-Timed     Status: None   Collection Time: 09/23/18  9:35 PM  Result Value Ref Range   Troponin I <0.03 <0.03 ng/mL  Pregnancy, urine POC     Status: None   Collection Time: 09/23/18  9:45 PM  Result Value Ref Range  Preg Test, Ur NEGATIVE NEGATIVE   ____________________________________________  EKG My review and personal interpretation at Time: 16:34   Indication: epigastric pain  Rate: 95  Rhythm: sinus with occasional pvc Axis: normal Other: nonspecific st abn, no stemi ____________________________________________  RADIOLOGY  I personally reviewed all radiographic images ordered to evaluate for the above acute complaints and reviewed radiology reports and findings.  These findings were personally discussed with the patient.  Please see medical record for radiology report.  ____________________________________________   PROCEDURES  Procedure(s) performed:  Procedures    Critical Care performed: no ____________________________________________   INITIAL IMPRESSION / ASSESSMENT AND PLAN / ED COURSE  Pertinent labs & imaging results that were available during my care of the patient were reviewed by me and considered in my medical decision making (see chart for details).   DDX: .gastritis, esophagitis, acs, pna, ptx, chf  Bethany [name] is a 38 y.o. who presents to the ED with symptoms as described above.  Patient well-appearing.  Abdominal exam soft and benign.  EKG with no evidence of acute ischemia.  Initial troponin is negative.  She is low risk by heart score but will further re-stratify with serial enzymes.  She is low risk by Wells criteria and is  PERC negative.  This not consistent with dissection.  Do have a high suspicion for gastritis or esophagitis particular as she is just recently been on several rounds of prednisone and is describing belching and burning sensation in her throat.   Clinical Course as of Sep 23 2213  Thu Sep 23, 2018  2208 Repeat troponin negative.  Patient reassessed is pain-free.  Had significant improvement after Carafate as well as Protonix.  I will discharge her with both.  Given her hypertension family history I do believe she would benefit from outpatient follow-up with cardiology.  Discussed signs and symptoms for which she should return to the ER.  At this point do believe patient stable and appropriate for outpatient follow-up.  We discharged home on Carafate as well as Protonix for probable gastritis.   [PR]    Clinical Course User Index [PR] Willy Eddy, MD     As part of my medical decision making, I reviewed the following data within the electronic MEDICAL RECORD NUMBER Nursing notes reviewed and incorporated, Labs reviewed, notes from prior ED visits and [place] Controlled Substance Database   ____________________________________________   FINAL CLINICAL IMPRESSION(S) / ED DIAGNOSES  Final diagnoses:  Epigastric pain  Atypical chest pain      NEW MEDICATIONS STARTED DURING THIS VISIT:  New Prescriptions   PANTOPRAZOLE (PROTONIX) 20 MG TABLET    Take 1 tablet (20 mg total) by mouth daily.   SUCRALFATE (CARAFATE) 1 G TABLET    Take 1 tablet (1 g total) by mouth 3 (three) times daily as needed.     Note:  This document was prepared using Dragon voice recognition software and may include unintentional dictation errors.    Willy Eddy, MD 09/23/18 2215

## 2018-09-28 ENCOUNTER — Encounter: Payer: Self-pay | Admitting: Family Medicine

## 2018-09-28 ENCOUNTER — Ambulatory Visit (INDEPENDENT_AMBULATORY_CARE_PROVIDER_SITE_OTHER): Payer: BLUE CROSS/BLUE SHIELD | Admitting: Family Medicine

## 2018-09-28 VITALS — BP 114/66 | HR 92 | Temp 98.3°F | Resp 16 | Ht 60.0 in | Wt 177.5 lb

## 2018-09-28 DIAGNOSIS — F411 Generalized anxiety disorder: Secondary | ICD-10-CM | POA: Diagnosis not present

## 2018-09-28 DIAGNOSIS — I493 Ventricular premature depolarization: Secondary | ICD-10-CM

## 2018-09-28 DIAGNOSIS — R7989 Other specified abnormal findings of blood chemistry: Secondary | ICD-10-CM | POA: Diagnosis not present

## 2018-09-28 DIAGNOSIS — F321 Major depressive disorder, single episode, moderate: Secondary | ICD-10-CM

## 2018-09-28 DIAGNOSIS — M79669 Pain in unspecified lower leg: Secondary | ICD-10-CM

## 2018-09-28 DIAGNOSIS — R1013 Epigastric pain: Secondary | ICD-10-CM

## 2018-09-28 DIAGNOSIS — I10 Essential (primary) hypertension: Secondary | ICD-10-CM

## 2018-09-28 MED ORDER — FLUOXETINE HCL 20 MG PO TABS: 20.0000 mg | ORAL_TABLET | Freq: Every day | ORAL | 0 refills | Status: DC

## 2018-09-28 NOTE — Patient Instructions (Signed)
Pantoprazole take 30 minutes before largest meal of the day.  carafate by itself twice daily

## 2018-09-28 NOTE — Progress Notes (Addendum)
Name: Bethany [name]   MRN: [id number]    DOB: [birth year]   Date:09/28/2018       Progress Note  Subjective  Chief Complaint  Chief Complaint  Patient presents with  . Follow-up  . Abdominal Pain    States the protonix is helping some of her symptoms but the Carafate is making her nausea    HPI  Epigastric pain: she states pain on epigastric area was severe on 09/23/2018 went to [facility] and was given carafate and pantoprazole 40 mg , she only started taking it yesterday, still having pain. She states carafate is causing nausea, no vomiting, no change in bowel movement or blood in stools . Advised to try taking it by itself in am and pm and follow up with GI.   Calf Tenderness/elevated Ddimer: had negative dopper [place] legs, she states SOB is better, normal pulse ox and no tachycardia, we will not check CT chest now. She is worried because brother died of PE 12-Jul-2018  HTN: bp is at goal now, PVC on [facility] done [facility], rate controlled now.    MDD/Anxiety: she did not like Duloxetine or hydroxyzine, not sure which one made her feel dizzy, so she stopped both, she states her daughter sees a psychiatrist and is doing well on prozac, she would like to try it. We will start on 20 mg and it may work for her . She is still under a lot of stress, states mother has been in and out of ICU in [place] for COPD.    Patient Active Problem List   Diagnosis Date Noted  . Hypertension, benign 09/14/2018  . Chronic left hip pain 12/04/2017  . Smell disturbance 12/04/2017  . Obesity, Class II, BMI 35-39.9 12/04/2017  . Abnormal finding on MRI of brain 11/05/2017  . Mild major depression ([place]) 09/11/2017  . Patellofemoral stress syndrome of both knees 08/09/2014    Past Surgical History:  Procedure Laterality Date  . CESAREAN SECTION  2002  . CESAREAN SECTION  2005    Family History  Problem Relation Age of Onset  . Emphysema Mother   . Hypercalcemia Mother   . Hypertension Mother   . Sickle cell trait  Mother   . Clotting disorder Mother   . Prostate cancer Father   . Diabetes Mellitus II Father   . Hypertension Father   . Heart Problems Father        Enlarged Heart  . Thyroid disease Sister   . Asthma Paternal Grandmother   . Lung cancer Maternal Uncle   . Lung cancer Maternal Aunt   . Breast cancer Maternal Aunt        early 4's  . Kidney disease Paternal Aunt     Social History   Socioeconomic History  . Marital status: Married    Spouse name: Legrand Como   . Number of children: 2  . Years of education: Not on file  . Highest education level: Some college, no degree  Occupational History  . Occupation: food service    Comment: at [employer]  . Financial resource strain: Somewhat hard  . Food insecurity:    Worry: Never true    Inability: Never true  . Transportation needs:    Medical: No    Non-medical: No  Tobacco Use  . Smoking status: Never Smoker  . Smokeless tobacco: Never Used  Substance and Sexual Activity  . Alcohol use: Not Currently    Alcohol/week: 0.0 standard drinks  Comment: occasionally  . Drug use: No  . Sexual activity: Not Currently    Partners: Male    Birth control/protection: None  Lifestyle  . Physical activity:    Days per week: 0 days    Minutes per session: 0 min  . Stress: Very much  Relationships  . Social connections:    Talks on phone: More than three times a week    Gets together: Never    Attends religious service: More than 4 times per year    Active member of club or organization: No    Attends meetings of clubs or organizations: Never    Relationship status: Married  . Intimate partner violence:    Fear of current or ex partner: No    Emotionally abused: No    Physically abused: No    Forced sexual activity: No  Other Topics Concern  . Not on file  Social History Narrative   Married, has two children at home, her relatives live [place]   Husband incarcerated since 2014 for robbery, he is  from [place] and may be deported next year when he gets out.    Works at [employer]    She moved to [place] for a safer place to raise her children    Her sone is homosexual and has been bullied      Current Outpatient Medications:  .  albuterol (PROVENTIL HFA;VENTOLIN HFA) 108 (90 Base) MCG/ACT inhaler, Inhale 2 puffs into the lungs every 6 (six) hours as needed for wheezing or shortness of breath., Disp: 1 Inhaler, Rfl: 2 .  azelastine (ASTELIN) 0.1 % nasal spray, , Disp: , Rfl: 0 .  baclofen (LIORESAL) 10 MG tablet, Take 1 tablet (10 mg total) by mouth at bedtime. (Patient taking differently: Take 10 mg by mouth as needed. ), Disp: 30 each, Rfl: 0 .  fluticasone (FLONASE) 50 MCG/ACT nasal spray, Place 2 sprays into both nostrils daily., Disp: 16 g, Rfl: 6 .  metoprolol succinate (TOPROL-XL) 25 MG 24 hr tablet, Take 1 tablet (25 mg total) by mouth daily., Disp: 90 tablet, Rfl: 0 .  montelukast (SINGULAIR) 10 MG tablet, Take 10 mg by mouth as needed. , Disp: , Rfl:  .  Multiple Vitamin (MULTIVITAMIN WITH MINERALS) TABS tablet, Take 1 tablet by mouth daily., Disp: , Rfl:  .  pantoprazole (PROTONIX) 20 MG tablet, Take 1 tablet (20 mg total) by mouth daily., Disp: 30 tablet, Rfl: 1 .  sucralfate (CARAFATE) 1 g tablet, Take 1 tablet (1 g total) by mouth 3 (three) times daily as needed. (Patient taking differently: Take 1 g by mouth 2 (two) times daily. ), Disp: 30 tablet, Rfl: 1 .  FLUoxetine (PROZAC) 20 MG tablet, Take 1 tablet (20 mg total) by mouth daily., Disp: 30 tablet, Rfl: 0  No Known Allergies  I personally reviewed active problem list, medication list, allergies, family history, social history with the patient/caregiver today.   ROS  Ten systems reviewed and is negative except as mentioned in HPI   Objective  Vitals:   09/28/18 1445  BP: 114/66  Pulse: 92  Resp: 16  Temp: 98.3 F (36.8 C)  TempSrc: Oral  SpO2: 98%  Weight: 177 lb 8 oz (80.5 kg)  Height: 5' (1.524 m)     Body mass index is 34.67 kg/m.  Physical Exam  Constitutional: Patient appears well-developed and well-nourished. Obese No distress.  HEENT: head atraumatic, normocephalic, pupils equal and reactive to light, neck supple, throat within normal limits  Cardiovascular: Normal rate, regular rhythm and normal heart sounds.  No murmur heard. No BLE edema. Pulmonary/Chest: Effort normal and breath sounds normal. No respiratory distress. Abdominal: Soft.  There is epigastric  Tenderness. Muscular : mild calf tenderness no redness or swelling  Psychiatric: Patient has a normal mood and affect. behavior is normal. Judgment and thought content normal.  Recent Results (from the past 2160 hour(s))  Basic metabolic panel     Status: None   Collection Time: 09/13/18  7:52 PM  Result Value Ref Range   Sodium 138 135 - 145 mmol/L   Potassium 3.8 3.5 - 5.1 mmol/L    Comment: HEMOLYSIS AT THIS LEVEL MAY AFFECT RESULT   Chloride 108 98 - 111 mmol/L   CO2 23 22 - 32 mmol/L   Glucose, Bld 82 70 - 99 mg/dL   BUN 12 6 - 20 mg/dL   Creatinine, Ser 0.81 0.44 - 1.00 mg/dL   Calcium 9.3 8.9 - 10.3 mg/dL   GFR calc non Af Amer >60 >60 mL/min   GFR calc Af Amer >60 >60 mL/min   Anion gap 7 5 - 15    Comment: Performed at [facility], [address] [place], [place] [address]  CBC     Status: None   Collection Time: 09/13/18  7:52 PM  Result Value Ref Range   WBC 4.4 4.0 - 10.5 K/uL   RBC 4.99 3.87 - 5.11 MIL/uL   Hemoglobin 13.7 12.0 - 15.0 g/dL   HCT 41.7 36.0 - 46.0 %   MCV 83.6 80.0 - 100.0 fL   MCH 27.5 26.0 - 34.0 pg   MCHC 32.9 30.0 - 36.0 g/dL   RDW 13.0 11.5 - 15.5 %   Platelets 266 150 - 400 K/uL   nRBC 0.0 0.0 - 0.2 %    Comment: Performed at [facility], [address] [place], [place] [address]  Troponin I - ONCE - STAT     Status: None   Collection Time: 09/13/18  7:52 PM  Result Value Ref Range   Troponin I <0.03 <0.03 ng/mL    Comment: Performed at  [facility], [address] [place], [place] [address]  Pregnancy, urine POC     Status: None   Collection Time: 09/13/18  9:25 PM  Result Value Ref Range   Preg Test, Ur NEGATIVE NEGATIVE    Comment:        THE SENSITIVITY OF THIS METHODOLOGY IS >24 mIU/mL   D-Dimer, Quantitative     Status: Abnormal   Collection Time: 09/14/18  3:09 PM  Result Value Ref Range   D-Dimer, Quant >35.00 (H) <0.50 mcg/mL FEU    Comment: Verified by repeat analysis. . . The D-Dimer test is used frequently to exclude an acute PE or DVT. In patients with a low to moderate clinical risk assessment and a D-Dimer result <0.50 mcg/mL FEU, the likelihood of a PE or DVT is very low. However, a thromboembolic event should not be excluded solely on the basis of the D-Dimer level. Increased levels of D-Dimer are associated with a PE, DVT, DIC, malignancies, inflammation, sepsis, surgery, trauma, pregnancy, and advancing patient age. [Jama 2006 11:295(2):199-207] . For additional information, please refer to: http://education.questdiagnostics.com/faq/FAQ149 (This link is being provided for informational/ educational purposes only) .   C-reactive protein     Status: None   Collection Time: 09/14/18  3:09 PM  Result Value Ref Range   CRP 0.2 <8.0 mg/L  Sed Rate (  ESR)     Status: None   Collection Time: 09/14/18  3:09 PM  Result Value Ref Range   Sed Rate 6 0 - 20 mm/h  Thyroid Panel With TSH     Status: Abnormal   Collection Time: 09/14/18  3:09 PM  Result Value Ref Range   T3 Uptake 36 (H) 22 - 35 %   T4, Total 8.4 5.1 - 11.9 mcg/dL   Free Thyroxine Index 3.0 1.4 - 3.8   TSH 1.11 mIU/L    Comment:           Reference Range .           > or = 20 Years  0.40-4.50 .                Pregnancy Ranges           First trimester    0.26-2.66           Second trimester   0.55-2.73           Third trimester    9.73-5.32   Basic metabolic panel     Status: Abnormal   Collection Time:  09/23/18  4:34 PM  Result Value Ref Range   Sodium 137 135 - 145 mmol/L   Potassium 3.3 (L) 3.5 - 5.1 mmol/L   Chloride 106 98 - 111 mmol/L   CO2 25 22 - 32 mmol/L   Glucose, Bld 101 (H) 70 - 99 mg/dL   BUN 11 6 - 20 mg/dL   Creatinine, Ser 0.97 0.44 - 1.00 mg/dL   Calcium 8.9 8.9 - 10.3 mg/dL   GFR calc non Af Amer >60 >60 mL/min   GFR calc Af Amer >60 >60 mL/min   Anion gap 6 5 - 15    Comment: Performed at [facility], [address] [place], [place] [address]  CBC     Status: None   Collection Time: 09/23/18  4:34 PM  Result Value Ref Range   WBC 5.7 4.0 - 10.5 K/uL   RBC 4.86 3.87 - 5.11 MIL/uL   Hemoglobin 13.2 12.0 - 15.0 g/dL   HCT 40.5 36.0 - 46.0 %   MCV 83.3 80.0 - 100.0 fL   MCH 27.2 26.0 - 34.0 pg   MCHC 32.6 30.0 - 36.0 g/dL   RDW 12.7 11.5 - 15.5 %   Platelets 305 150 - 400 K/uL   nRBC 0.0 0.0 - 0.2 %    Comment: Performed at [facility], [address] [place], [place] [address]  Troponin I - ONCE - STAT     Status: None   Collection Time: 09/23/18  6:24 PM  Result Value Ref Range   Troponin I <0.03 <0.03 ng/mL    Comment: Performed at [facility], [address] [place], [place] [address]  Troponin I - Once-Timed     Status: None   Collection Time: 09/23/18  9:35 PM  Result Value Ref Range   Troponin I <0.03 <0.03 ng/mL    Comment: Performed at [facility], [address] [place], [place] [address]  Pregnancy, urine POC     Status: None   Collection Time: 09/23/18  9:45 PM  Result Value Ref Range   Preg Test, Ur NEGATIVE NEGATIVE    Comment:        THE SENSITIVITY OF THIS METHODOLOGY IS >24 mIU/mL       PHQ2/9: Depression screen [facility] 2/9 09/28/2018 06/07/2018 06/07/2018 03/10/2018 12/04/2017  Decreased Interest 1 0 0 2 1  Down, Depressed, Hopeless 2 0 0 2 1  PHQ - 2 Score 3 0 0 4 2  Altered sleeping 3 1 0 3 1  Tired, decreased energy 3 1 0 3 1  Change in appetite 0 0 0 3 1  Feeling bad or failure  about yourself  1 1 0 3 1  Trouble concentrating 2 0 0 2 0  Moving slowly or fidgety/restless 0 0 0 0 0  Suicidal thoughts 0 0 0 0 0  PHQ-9 Score 12 3 0 18 6  Difficult doing work/chores Somewhat difficult Somewhat difficult Not difficult at all Very difficult Somewhat difficult     Fall Risk: Fall Risk  09/14/2018 06/07/2018 06/07/2018 03/10/2018 12/04/2017  Falls in the past year? 0 No No No No  Number falls in past yr: 0 - - - -  Injury with Fall? 0 - - - -      Assessment & Plan  1. Moderate major depression (HCC)  - FLUoxetine (PROZAC) 20 MG tablet; Take 1 tablet (20 mg total) by mouth daily.  Dispense: 30 tablet; Refill: 0  2. GAD (generalized anxiety disorder)  - FLUoxetine (PROZAC) 20 MG tablet; Take 1 tablet (20 mg total) by mouth daily.  Dispense: 30 tablet; Refill: 0  3. Calf tenderness  Improving, d-dimer was positive but negative CT for PE, she states SOB also better  4. Elevated d-dimer  See above   5. Hypertension, benign  Back to normal   6. PVC (premature ventricular contraction)  On EKG done at [facility], she states always present, she has follow up with cardiologist   7. Epigastric abdominal pain  - Ambulatory referral to Gastroenterology On PPI and sucralfate given by [facility]

## 2018-09-30 ENCOUNTER — Other Ambulatory Visit: Payer: Self-pay | Admitting: Family Medicine

## 2018-09-30 ENCOUNTER — Telehealth: Payer: Self-pay

## 2018-09-30 DIAGNOSIS — R002 Palpitations: Secondary | ICD-10-CM

## 2018-09-30 DIAGNOSIS — I10 Essential (primary) hypertension: Secondary | ICD-10-CM

## 2018-09-30 DIAGNOSIS — I493 Ventricular premature depolarization: Secondary | ICD-10-CM

## 2018-09-30 NOTE — Telephone Encounter (Signed)
Copied from CRM [id number]. Topic: Referral - Request for Referral >> Sep 29, 2018  3:17 PM Wyonia Hough E wrote: Has patient seen PCP for this complaint? Yes *If NO, is insurance requiring patient see PCP for this issue before PCP can refer them? Referral for which specialty: cardiology  Preferred provider/office: [facility] cardiology  fax# [phone] Attention: referral center  Reason for referral: chest pains/PVC

## 2018-10-11 ENCOUNTER — Encounter: Payer: Self-pay | Admitting: Emergency Medicine

## 2018-10-11 ENCOUNTER — Emergency Department
Admission: EM | Admit: 2018-10-11 | Discharge: 2018-10-11 | Disposition: A | Discharge status: Home / Self Care | Payer: BLUE CROSS/BLUE SHIELD | Attending: Emergency Medicine | Admitting: Emergency Medicine

## 2018-10-11 ENCOUNTER — Emergency Department: Payer: BLUE CROSS/BLUE SHIELD

## 2018-10-11 DIAGNOSIS — R002 Palpitations: Secondary | ICD-10-CM | POA: Diagnosis not present

## 2018-10-11 DIAGNOSIS — R079 Chest pain, unspecified: Secondary | ICD-10-CM | POA: Diagnosis not present

## 2018-10-11 DIAGNOSIS — Z79899 Other long term (current) drug therapy: Secondary | ICD-10-CM | POA: Insufficient documentation

## 2018-10-11 DIAGNOSIS — I10 Essential (primary) hypertension: Secondary | ICD-10-CM | POA: Diagnosis not present

## 2018-10-11 DIAGNOSIS — R0789 Other chest pain: Secondary | ICD-10-CM | POA: Diagnosis not present

## 2018-10-11 HISTORY — DX: Type 2 diabetes mellitus without complications: E11.9

## 2018-10-11 HISTORY — DX: Essential (primary) hypertension: I10

## 2018-10-11 LAB — CBC
HEMATOCRIT: 39.3 % (ref 36.0–46.0)
HEMOGLOBIN: 13 g/dL (ref 12.0–15.0)
MCH: 27 pg (ref 26.0–34.0)
MCHC: 33.1 g/dL (ref 30.0–36.0)
MCV: 81.5 fL (ref 80.0–100.0)
Platelets: 220 10*3/uL (ref 150–400)
RBC: 4.82 MIL/uL (ref 3.87–5.11)
RDW: 13 % (ref 11.5–15.5)
WBC: 4.3 10*3/uL (ref 4.0–10.5)
nRBC: 0 % (ref 0.0–0.2)

## 2018-10-11 LAB — BASIC METABOLIC PANEL
ANION GAP: 13 (ref 5–15)
BUN: 9 mg/dL (ref 6–20)
CO2: 18 mmol/L — ABNORMAL LOW (ref 22–32)
Calcium: 9.3 mg/dL (ref 8.9–10.3)
Chloride: 107 mmol/L (ref 98–111)
Creatinine, Ser: 0.85 mg/dL (ref 0.44–1.00)
GFR calc Af Amer: >60 mL/min (ref >60)
GFR calc non Af Amer: >60 mL/min (ref >60)
Glucose, Bld: 109 mg/dL — ABNORMAL HIGH (ref 70–99)
Potassium: 3.1 mmol/L — ABNORMAL LOW (ref 3.5–5.1)
Sodium: 138 mmol/L (ref 135–145)

## 2018-10-11 LAB — TROPONIN I
Troponin I: <0.03 ng/mL (ref <0.03)
Troponin I: <0.03 ng/mL (ref <0.03)

## 2018-10-11 LAB — HCG, QUANTITATIVE, PREGNANCY: hCG, Beta Chain, Quant, S: <1 m[IU]/mL (ref <5)

## 2018-10-11 MED ORDER — IOHEXOL 350 MG/ML SOLN
75.0000 mL | Freq: Once | INTRAVENOUS | Status: AC | PRN
  Administered 2018-10-11: 75 mL via INTRAVENOUS

## 2018-10-11 NOTE — ED Triage Notes (Signed)
Pt reports she woke up this am and got her kids off to school and her heart felt like it was racing. Pt reports pressure like pain to her chest as well

## 2018-10-11 NOTE — ED Triage Notes (Signed)
Pt reports does not want a chest x-ray she wants a chest CT scan.

## 2018-10-11 NOTE — ED Notes (Signed)
Pt presents with chest pain and palpitations this morning. She reports hx of the same and states that it feels different and that she was feeling panicky. Pt denies any arm pain, n/v; endorses sob but attributes it to panic. Pain reported as pressure in central chest.

## 2018-10-11 NOTE — ED Provider Notes (Signed)
[facility] Emergency Department Provider Note  ____________________________________________  Time seen: Approximately 10:42 AM  I have reviewed the triage vital signs and the nursing notes.   HISTORY  Chief Complaint Chest Pain and Irregular Heart Beat    HPI Eshani Maestre is a 38 y.o. female with a history of hypertension who comes the ED complaining of chest pressure that is nonradiating, not associated diaphoresis vomiting or shortness of breath.  She also reports some palpitations but no dizziness or syncope.  Pressure has been continuous, waxing and waning since onset at 7:15 AM today.  She is had similar symptoms in the past, previously attributed to anxiety, and is currently in the process of being referred to outpatient cardiology by her primary care doctor for further evaluation of the symptoms.  Pain is not exertional, not pleuritic  She previously had an elevated d-dimer but negative ultrasounds of her legs.  Her brother in law died of PE about 3 months ago, but not direct family hx of clotting disorder or VTE   "Spider bite" to mid back about a month ago   Past Medical History:  Diagnosis Date  . Hypertension      Patient Active Problem List   Diagnosis Date Noted  . Hypertension, benign 09/14/2018  . Chronic left hip pain 12/04/2017  . Smell disturbance 12/04/2017  . Obesity, Class II, BMI 35-39.9 12/04/2017  . Abnormal finding on MRI of brain 11/05/2017  . 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 Patellofemoral stress syndrome of both knees 08/09/2014     Past Surgical History:  Procedure Laterality Date  . CESAREAN SECTION  2002  . CESAREAN SECTION  2005     Prior to Admission medications   Medication Sig Start Date End Date Taking? Authorizing Provider  albuterol (PROVENTIL HFA;VENTOLIN HFA) 108 (90 Base) MCG/ACT inhaler Inhale 2 puffs into the lungs every 6 (six) hours as needed for wheezing or shortness of breath.  06/21/18   Phineas Semen, MD  azelastine (ASTELIN) 0.1 % nasal spray  10/03/17   [provider]  baclofen (LIORESAL) 10 MG tablet Take 1 tablet (10 mg total) by mouth at bedtime. Patient taking differently: Take 10 mg by mouth as needed.  10/05/17   Alba Cory, MD  FLUoxetine (PROZAC) 20 MG tablet Take 1 tablet (20 mg total) by mouth daily. 09/28/18   Alba Cory, MD  fluticasone (FLONASE) 50 MCG/ACT nasal spray Place 2 sprays into both nostrils daily. 09/13/17   Alba Cory, MD  metoprolol succinate (TOPROL-XL) 25 MG 24 hr tablet Take 1 tablet (25 mg total) by mouth daily. 09/14/18   Alba Cory, MD  montelukast (SINGULAIR) 10 MG tablet Take 10 mg by mouth as needed.     [provider]  Multiple Vitamin (MULTIVITAMIN WITH MINERALS) TABS tablet Take 1 tablet by mouth daily.    [provider]  pantoprazole (PROTONIX) 20 MG tablet Take 1 tablet (20 mg total) by mouth daily. 09/23/18 09/23/19  Willy Eddy, MD  sucralfate (CARAFATE) 1 g tablet Take 1 tablet (1 g total) by mouth 3 (three) times daily as needed. Patient taking differently: Take 1 g by mouth 2 (two) times daily.  09/23/18 09/23/19  Willy Eddy, MD     Allergies Patient has no known allergies.   Family History  Problem Relation Age of Onset  . Emphysema Mother   . Hypercalcemia Mother   . Hypertension Mother   . Sickle cell trait Mother   . Clotting disorder Mother   .  Prostate cancer Father   . Diabetes Mellitus II Father   . Hypertension Father   . Heart Problems Father        Enlarged Heart  . Thyroid disease Sister   . Asthma Paternal Grandmother   . Lung cancer Maternal Uncle   . Lung cancer Maternal Aunt   . Breast cancer Maternal Aunt        early 42's  . Kidney disease Paternal Aunt     Social History Social History   Tobacco Use  . Smoking status: Never Smoker  . Smokeless tobacco: Never Used  Substance Use Topics  . Alcohol use: Not Currently     Alcohol/week: 0.0 standard drinks    Comment: occasionally  . Drug use: No    Review of Systems  Constitutional:   No fever or chills.  ENT:   No sore throat. No rhinorrhea. Cardiovascular: Positive as above chest pain without syncope. Respiratory:   No dyspnea or cough. Gastrointestinal:   Negative for abdominal pain, vomiting and diarrhea.  Musculoskeletal:   Negative for focal pain or swelling All other systems reviewed and are negative except as documented above in ROS and HPI.  ____________________________________________   PHYSICAL EXAM:  VITAL SIGNS: ED Triage Vitals  Enc Vitals Group     BP 10/11/18 0727 (!) 154/103     Pulse Rate 10/11/18 0727 73     Resp 10/11/18 0727 20     Temp 10/11/18 0727 97.9 F (36.6 C)     Temp Source 10/11/18 0727 Oral     SpO2 10/11/18 0727 100 %     Weight 10/11/18 0725 171 lb (77.6 kg)     Height 10/11/18 0725 5' (1.524 m)     Head Circumference --      Peak Flow --      Pain Score 10/11/18 0725 8     Pain Loc --      Pain Edu? --      Excl. in GC? --     Vital signs reviewed, nursing assessments reviewed.   Constitutional:   Alert and oriented. Non-toxic appearance. Eyes:   Conjunctivae are normal. EOMI. PERRL. ENT      Head:   Normocephalic and atraumatic.      Nose:   No congestion/rhinnorhea.       Mouth/Throat:   MMM, no pharyngeal erythema. No peritonsillar mass.       Neck:   No meningismus. Full ROM. Hematological/Lymphatic/Immunilogical:   No cervical lymphadenopathy. Cardiovascular:   RRR. Symmetric bilateral radial and DP pulses.  No murmurs. Cap refill less than 2 seconds. Respiratory:   Normal respiratory effort without tachypnea/retractions. Breath sounds are clear and equal bilaterally. No wheezes/rales/rhonchi. Gastrointestinal:   Soft and nontender. Non distended. There is no CVA tenderness.  No rebound, rigidity, or guarding. Musculoskeletal:   Normal range of motion in all extremities. No joint effusions.   No lower extremity tenderness.  No edema.  Chest wall nontender Neurologic:   Normal speech and language.  Motor grossly intact. No acute focal neurologic deficits are appreciated.  Skin:    Skin is warm, dry and intact. No rash noted.  No petechiae, purpura, or bullae.  Focal vague skin changes from healing inflammatory reaction, no current evidence of cellulitis, no tenderness or swelling.  ____________________________________________    LABS (pertinent positives/negatives) (all labs ordered are listed, but only abnormal results are displayed) Labs Reviewed  BASIC METABOLIC PANEL - Abnormal; Notable for the following components:  Result Value   Potassium 3.1 (*)    CO2 18 (*)    Glucose, Bld 109 (*)    All other components within normal limits  CBC  TROPONIN I  HCG, QUANTITATIVE, PREGNANCY  TROPONIN I  POC URINE PREG, ED   ____________________________________________   EKG  Interpreted by me Sinus tachycardia rate 101, normal axis and intervals.  Normal QRS ST segments and T waves.  ____________________________________________    RADIOLOGY  Dg Chest 2 View  Result Date: 10/11/2018 CLINICAL DATA:  Palpitation and chest pressure. EXAM: CHEST - 2 VIEW COMPARISON:  09/23/2018 FINDINGS: Normal heart size. Lungs clear. No pneumothorax. No pleural effusion. IMPRESSION: No active cardiopulmonary disease. Electronically Signed   By: Jolaine Click M.D.   On: 10/11/2018 07:47    ____________________________________________   PROCEDURES Procedures  ____________________________________________  DIFFERENTIAL DIAGNOSIS   Atypical chest pain, pleurisy, pneumonia, pulmonary embolism  CLINICAL IMPRESSION / ASSESSMENT AND PLAN / ED COURSE  Medications ordered in the ED: Medications - No data to display  Pertinent labs & imaging results that were available during my care of the patient were reviewed by me and considered in my medical decision making (see chart for  details).    Patient presents with atypical chest pain.  Given the previous finding of elevated d-dimer by primary care and ongoing symptoms, discussed obtaining CT scan of the chest with the patient to which she agrees.  Doubt ACS dissection pericarditis.  Chest x-ray unremarkable, initial labs unremarkable.  For further stratification I will also get a 3-hour delta troponin.  Clinical Course as of Oct 11 1246  Mon Oct 11, 2018  1112 CTA negative   [PS]    Clinical Course User Index [PS] Sharman Cheek, MD     ----------------------------------------- 12:49 PM on 10/11/2018 -----------------------------------------  Repeat troponin negative.  Patient feeling better.  Recommend follow-up with primary care and her cardiology referral.  Patient shows a cell phone picture of the area in her mid back that was inflamed, clearly looking like folliculitis or cellulitis.  This is now resolved on today's evaluation, possibly from the leftover amoxicillin and mupirocin that she used at home when it first started a month ago.  ____________________________________________   FINAL CLINICAL IMPRESSION(S) / ED DIAGNOSES    Final diagnoses:  Atypical chest pain     ED Discharge Orders    None      Portions of this note were generated with dragon dictation software. Dictation errors may occur despite best attempts at proofreading.   Sharman Cheek, MD 10/11/18 1250

## 2018-10-11 NOTE — ED Notes (Signed)
Pt discharged home after verbalizing understanding of discharge instructions; nad noted. 

## 2018-10-11 NOTE — ED Notes (Signed)
First Nurse Note: Patient complaining of waking up with chest pain and SHOB.  HR - 84 apically.  Skin warm and dry.

## 2018-10-11 NOTE — ED Notes (Signed)
Patient to Rm 25 via WC with family member.  Helped onto stretcher, given gown to put on.  [facility] RN aware of room placement.

## 2018-10-11 NOTE — ED Notes (Signed)
Sent rainbow to lab. 

## 2018-10-11 NOTE — Discharge Instructions (Addendum)
Your test today including blood work, EKG, and CT scan of the chest were all okay.  Please continue following up with your primary care doctor and cardiology for further evaluation of symptoms.

## 2018-11-09 ENCOUNTER — Ambulatory Visit (INDEPENDENT_AMBULATORY_CARE_PROVIDER_SITE_OTHER): Payer: BLUE CROSS/BLUE SHIELD | Admitting: Family Medicine

## 2018-11-09 ENCOUNTER — Other Ambulatory Visit: Payer: Self-pay

## 2018-11-09 ENCOUNTER — Other Ambulatory Visit: Payer: Self-pay | Admitting: Family Medicine

## 2018-11-09 ENCOUNTER — Encounter: Payer: Self-pay | Admitting: Family Medicine

## 2018-11-09 VITALS — HR 64 | Temp 97.7°F | Wt 171.2 lb

## 2018-11-09 DIAGNOSIS — F411 Generalized anxiety disorder: Secondary | ICD-10-CM

## 2018-11-09 DIAGNOSIS — F321 Major depressive disorder, single episode, moderate: Secondary | ICD-10-CM

## 2018-11-09 DIAGNOSIS — I10 Essential (primary) hypertension: Secondary | ICD-10-CM | POA: Diagnosis not present

## 2018-11-09 DIAGNOSIS — R002 Palpitations: Secondary | ICD-10-CM | POA: Diagnosis not present

## 2018-11-09 DIAGNOSIS — G43009 Migraine without aura, not intractable, without status migrainosus: Secondary | ICD-10-CM

## 2018-11-09 DIAGNOSIS — R200 Anesthesia of skin: Secondary | ICD-10-CM

## 2018-11-09 MED ORDER — FLUOXETINE HCL 20 MG PO TABS: 20.0000 mg | ORAL_TABLET | Freq: Every day | ORAL | 0 refills | Status: DC

## 2018-11-09 MED ORDER — METOPROLOL SUCCINATE ER 25 MG PO TB24: 25.0000 mg | ORAL_TABLET | Freq: Every day | ORAL | 1 refills | Status: DC

## 2018-11-09 NOTE — Progress Notes (Signed)
Name: Bethany [name]   MRN: [id number]    DOB: [birth year]   Date:11/09/2018       Progress Note  Subjective  Chief Complaint  Chief Complaint  Patient presents with  . Depression    1 month follow up    I connected with@ on 11/09/18 at  3:00 PM EDT by a video enabled telemedicine application and verified that I am speaking with the correct person using two identifiers.  I discussed the limitations of evaluation and management by telemedicine and the availability of in person appointments. The patient expressed understanding and agreed to proceed. Staff also discussed with the patient that there may be a patient responsible charge related to this service. Patient Location: home  Provider Location: [facility]    HPI   MDD/Anxiety: she did not like Duloxetine or hydroxyzine, not sure which one made her feel dizzy, so she stopped both, she states her daughter sees a psychiatrist and is doing well on prozac, so we gave her a rx Feb 2020 but she weaned self off on her own, after one month of taking because she was worried that it was causing left arm pain. She states the pain on her arm improved, however had another episodes last night. She states she is willing to resume medicaiton. 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 Still worries about her mother and her children are overwhelmed with school work   Epigastric pain: she has noticed an improvement, she is taking some carafate and PPI , she has a follow up with GI and cardiologist however it will likely be postponed secondary to COVID-19  Migraine and left arm numbness: seen by Dr. Sherryll Burger at [facility] but no longer in network and she asked me to make another referral   Patient Active Problem List   Diagnosis Date Noted  . Hypertension, benign 09/14/2018  . Chronic left hip pain 12/04/2017  .  Smell disturbance 12/04/2017  . Obesity, Class II, BMI 35-39.9 12/04/2017  . Abnormal finding on MRI of brain 11/05/2017  . 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 Patellofemoral stress syndrome of both knees 08/09/2014    Past Surgical History:  Procedure Laterality Date  . CESAREAN SECTION  2002  . CESAREAN SECTION  2005    Family History  Problem Relation Age of Onset  . Emphysema Mother   . Hypercalcemia Mother   . Hypertension Mother   . Sickle cell trait Mother   . Clotting disorder Mother   . Prostate cancer Father   . Diabetes Mellitus II Father   . Hypertension Father   . Heart Problems Father        Enlarged Heart  . Thyroid disease Sister   . Asthma Paternal Grandmother   . Lung cancer Maternal Uncle   . Lung cancer Maternal Aunt   . Breast cancer Maternal Aunt        early 73's  . Kidney disease Paternal Aunt     Social History   Socioeconomic History  . Marital status: Married    Spouse name: Casimiro Needle   . Number of children: 2  . Years of education: Not on file  . Highest education level: Some college, no degree  Occupational History  . Occupation: food service    Comment: at [employer]  . Financial resource strain: Somewhat hard  .  Food insecurity:    Worry: Never true    Inability: Never true  . Transportation needs:    Medical: No    Non-medical: No  Tobacco Use  . Smoking status: Never Smoker  . Smokeless tobacco: Never Used  Substance and Sexual Activity  . Alcohol use: Not Currently    Alcohol/week: 0.0 standard drinks    Comment: occasionally  . Drug use: No  . Sexual activity: Not Currently    Partners: Male    Birth control/protection: None  Lifestyle  . Physical activity:    Days per week: 0 days    Minutes per session: 0 min  . Stress: Very much  Relationships  . Social connections:    Talks on phone: More than three times a week    Gets together: Never    Attends religious service: More than 4 times  per year    Active member of club or organization: No    Attends meetings of clubs or organizations: Never    Relationship status: Married  . Intimate partner violence:    Fear of current or ex partner: No    Emotionally abused: No    Physically abused: No    Forced sexual activity: No  Other Topics Concern  . Not on file  Social History Narrative   Married, has two children at home, her relatives live [place]   Husband incarcerated since 2014 for robbery, he is from [place] and may be deported next year when he gets out.    Works at [employer]    She moved to [place] 2011 for a safer place to raise her children    Her sone is homosexual and has been bullied      Current Outpatient Medications:  .  azelastine (ASTELIN) 0.1 % nasal spray, , Disp: , Rfl: 0 .  baclofen (LIORESAL) 10 MG tablet, Take 1 tablet (10 mg total) by mouth at bedtime. (Patient taking differently: Take 10 mg by mouth as needed. ), Disp: 30 each, Rfl: 0 .  FLUoxetine (PROZAC) 20 MG tablet, Take 1 tablet (20 mg total) by mouth daily., Disp: 30 tablet, Rfl: 0 .  fluticasone (FLONASE) 50 MCG/ACT nasal spray, Place 2 sprays into both nostrils daily., Disp: 16 g, Rfl: 6 .  metoprolol succinate (TOPROL-XL) 25 MG 24 hr tablet, Take 1 tablet (25 mg total) by mouth daily., Disp: 90 tablet, Rfl: 1 .  montelukast (SINGULAIR) 10 MG tablet, Take 10 mg by mouth as needed. , Disp: , Rfl:  .  Multiple Vitamin (MULTIVITAMIN WITH MINERALS) TABS tablet, Take 1 tablet by mouth daily., Disp: , Rfl:  .  pantoprazole (PROTONIX) 20 MG tablet, Take 1 tablet (20 mg total) by mouth daily., Disp: 30 tablet, Rfl: 1 .  sucralfate (CARAFATE) 1 g tablet, Take 1 tablet (1 g total) by mouth 3 (three) times daily as needed. (Patient taking differently: Take 1 g by mouth 2 (two) times daily. ), Disp: 30 tablet, Rfl: 1 .  albuterol (PROVENTIL HFA;VENTOLIN HFA) 108 (90 Base) MCG/ACT inhaler, Inhale 2 puffs into the lungs every 6 (six) hours as needed for  wheezing or shortness of breath. (Patient not taking: Reported on 11/09/2018), Disp: 1 Inhaler, Rfl: 2  No Known Allergies  I personally reviewed active problem list, medication list, allergies, family history, social history, health maintenance with the patient/caregiver today.   ROS  Ten systems reviewed and is negative except as mentioned in HPI   Objective  Virtual encounter, vitals not obtained.  Body mass index is 33.44 kg/m.  Physical Exam  Awake, alert,well groomed and speaking in full sentences  PHQ2/9: Depression screen [facility] 2/9 11/09/2018 09/28/2018 06/07/2018 06/07/2018 03/10/2018  Decreased Interest 1 1 0 0 2  Down, Depressed, Hopeless 0 2 0 0 2  PHQ - 2 Score 1 3 0 0 4  Altered sleeping 1 3 1  0 3  Tired, decreased energy 0 3 1 0 3  Change in appetite 1 0 0 0 3  Feeling bad or failure about yourself  1 1 1  0 3  Trouble concentrating 0 2 0 0 2  Moving slowly or fidgety/restless 0 0 0 0 0  Suicidal thoughts 0 0 0 0 0  PHQ-9 Score 4 12 3  0 18  Difficult doing work/chores Not difficult at all Somewhat difficult Somewhat difficult Not difficult at all Very difficult   PHQ-2/9 Result is positive.    Fall Risk: Fall Risk  11/09/2018 09/14/2018 06/07/2018 06/07/2018 03/10/2018  Falls in the past year? 0 0 No No No  Number falls in past yr: 0 0 - - -  Injury with Fall? 0 0 - - -     Assessment & Plan  1. Palpitation  - metoprolol succinate (TOPROL-XL) 25 MG 24 hr tablet; Take 1 tablet (25 mg total) by mouth daily.  Dispense: 90 tablet; Refill: 1  2. Hypertension, benign  - metoprolol succinate (TOPROL-XL) 25 MG 24 hr tablet; Take 1 tablet (25 mg total) by mouth daily.  Dispense: 90 tablet; Refill: 1  3. Moderate major depression (HCC)  She is not taking medication   4. GAD (generalized anxiety disorder)  She stopped taking Prozac, but would like to resume it today   5. Migraine without aura and without status migrainosus, not intractable  - Ambulatory  referral to Neurology  6. Left arm numbness  - Ambulatory referral to Neurology  I discussed the assessment and treatment plan with the patient. The patient was provided an opportunity to ask questions and all were answered. The patient agreed with the plan and demonstrated an understanding of the instructions.  The patient was advised to call back or seek an in-person evaluation if the symptoms worsen or if the condition fails to improve as anticipated.  I provided 25  minutes of non-face-to-face time during this encounter.

## 2018-11-24 ENCOUNTER — Encounter: Payer: Self-pay | Admitting: Family Medicine

## 2018-11-24 ENCOUNTER — Other Ambulatory Visit: Payer: Self-pay | Admitting: Family Medicine

## 2018-11-24 DIAGNOSIS — R7303 Prediabetes: Secondary | ICD-10-CM

## 2018-11-24 DIAGNOSIS — E876 Hypokalemia: Secondary | ICD-10-CM

## 2018-11-29 DIAGNOSIS — R7303 Prediabetes: Secondary | ICD-10-CM | POA: Diagnosis not present

## 2018-11-29 DIAGNOSIS — E876 Hypokalemia: Secondary | ICD-10-CM | POA: Diagnosis not present

## 2018-11-30 LAB — LIPID PANEL
Cholesterol: 151 mg/dL (ref <200)
HDL: 55 mg/dL (ref >=50)
LDL Cholesterol (Calc): 77 mg/dL (calc)
Non-HDL Cholesterol (Calc): 96 mg/dL (calc) (ref <130)
Total CHOL/HDL Ratio: 2.7 (calc) (ref <5.0)
Triglycerides: 103 mg/dL (ref <150)

## 2018-11-30 LAB — MAGNESIUM: Magnesium: 1.9 mg/dL (ref 1.5–2.5)

## 2018-11-30 LAB — HEMOGLOBIN A1C
Hgb A1c MFr Bld: 5.4 % of total Hgb (ref <5.7)
Mean Plasma Glucose: 108 (calc)
eAG (mmol/L): 6 (calc)

## 2018-11-30 LAB — POTASSIUM: Potassium: 4.1 mmol/L (ref 3.5–5.3)

## 2018-12-01 DIAGNOSIS — G43019 Migraine without aura, intractable, without status migrainosus: Secondary | ICD-10-CM | POA: Diagnosis not present

## 2018-12-07 ENCOUNTER — Ambulatory Visit: Payer: BLUE CROSS/BLUE SHIELD | Admitting: Family Medicine

## 2018-12-08 ENCOUNTER — Other Ambulatory Visit: Payer: Self-pay

## 2018-12-08 ENCOUNTER — Ambulatory Visit (INDEPENDENT_AMBULATORY_CARE_PROVIDER_SITE_OTHER): Payer: BLUE CROSS/BLUE SHIELD | Admitting: Family Medicine

## 2018-12-08 ENCOUNTER — Encounter: Payer: Self-pay | Admitting: Family Medicine

## 2018-12-08 ENCOUNTER — Telehealth: Payer: Self-pay | Admitting: *Deleted

## 2018-12-08 VITALS — BP 118/80 | HR 70 | Temp 98.2°F | Wt 169.0 lb

## 2018-12-08 DIAGNOSIS — F320 Major depressive disorder, single episode, mild: Secondary | ICD-10-CM | POA: Diagnosis not present

## 2018-12-08 DIAGNOSIS — R1013 Epigastric pain: Secondary | ICD-10-CM | POA: Diagnosis not present

## 2018-12-08 DIAGNOSIS — F411 Generalized anxiety disorder: Secondary | ICD-10-CM | POA: Diagnosis not present

## 2018-12-08 DIAGNOSIS — G43009 Migraine without aura, not intractable, without status migrainosus: Secondary | ICD-10-CM

## 2018-12-08 DIAGNOSIS — I493 Ventricular premature depolarization: Secondary | ICD-10-CM | POA: Diagnosis not present

## 2018-12-08 MED ORDER — FLUOXETINE HCL 20 MG PO TABS: 20.0000 mg | ORAL_TABLET | Freq: Every day | ORAL | 0 refills | Status: DC

## 2018-12-08 NOTE — Progress Notes (Signed)
Name: Bethany BanasSonya Pinnix   MRN: [id number]    DOB: 1981-04-01   Date:12/08/2018       Progress Note  Subjective  Chief Complaint  Chief Complaint  Patient presents with  . Depression    I connected with  Bethany [name]  on 12/08/18 at  2:40 PM EDT by a video enabled telemedicine application and verified that I am speaking with the correct person using two identifiers.  I discussed the limitations of evaluation and management by telemedicine and the availability of in person appointments. The patient expressed understanding and agreed to proceed. Staff also discussed with the patient that there may be a patient responsible charge related to this service. Patient Location: at home  Provider Location: [facility]   HPI  MDD/Anxiety: she did not like Duloxetine or hydroxyzine, not sure which one made her feel dizzy, so she stopped both, back in Feb 2020 she told me that her daughter was seeing  a psychiatrist and was doing well on prozac, so we gave her a rx Feb 2020 but she weaned self off on her own, after one month of taking because she was worried that it was causing left arm pain, she was given reassurance and resumed medication one month ago and over the past week she noticed that her sleep pattern has regulated, she has been getting up at 8 am and preparing breakfast, eating lost of fruit and vegetables, fatigue resolved and no longer feels down and depressed. She still has stressors in her life, lost her job due to COVID-19, worries about her mother , also worries about her children, but coping well.   Epigastric pain: she has noticed an improvement, she is taking some carafate and PPI , she has a follow up with GI and cardiologist however it will likely be postponed secondary to COVID-19. Unchanged  Migraine and left arm numbness: taking magnesium and symptoms have improved  PVC: taking metoprolol and states very seldom has palpitation, that  lasts a few seconds, not  related to chest pain or SOB and resolves by itself. Episodes about once a week. Discussed valsalva maneuver and monitor for now   Patient Active Problem List   Diagnosis Date Noted  . Hypertension, benign 09/14/2018  . Chronic left hip pain 12/04/2017  . Smell disturbance 12/04/2017  . Obesity, Class II, BMI 35-39.9 12/04/2017  . Abnormal finding on MRI of brain 11/05/2017  . 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 Patellofemoral stress syndrome of both knees 08/09/2014    Past Surgical History:  Procedure Laterality Date  . CESAREAN SECTION  2002  . CESAREAN SECTION  2005    Family History  Problem Relation Age of Onset  . Emphysema Mother   . Hypercalcemia Mother   . Hypertension Mother   . Sickle cell trait Mother   . Clotting disorder Mother   . Prostate cancer Father   . Diabetes Mellitus II Father   . Hypertension Father   . Heart Problems Father        Enlarged Heart  . Thyroid disease Sister   . Asthma Paternal Grandmother   . Lung cancer Maternal Uncle   . Lung cancer Maternal Aunt   . Breast cancer Maternal Aunt        early 4760's  . Kidney disease Paternal Aunt     Social History   Socioeconomic History  . Marital status: Married    Spouse name: Casimiro NeedleMichael   . Number of children: 2  .  Years of education: Not on file  . Highest education level: Some college, no degree  Occupational History  . Occupation: food service    Comment: at [employer]  . Financial resource strain: Somewhat hard  . Food insecurity:    Worry: Never true    Inability: Never true  . Transportation needs:    Medical: No    Non-medical: No  Tobacco Use  . Smoking status: Never Smoker  . Smokeless tobacco: Never Used  Substance and Sexual Activity  . Alcohol use: Not Currently    Alcohol/week: 0.0 standard drinks    Comment: occasionally  . Drug use: No  . Sexual activity: Not Currently    Partners: Male    Birth control/protection: None  Lifestyle   . Physical activity:    Days per week: 0 days    Minutes per session: 0 min  . Stress: Very much  Relationships  . Social connections:    Talks on phone: More than three times a week    Gets together: Never    Attends religious service: More than 4 times per year    Active member of club or organization: No    Attends meetings of clubs or organizations: Never    Relationship status: Married  . Intimate partner violence:    Fear of current or ex partner: No    Emotionally abused: No    Physically abused: No    Forced sexual activity: No  Other Topics Concern  . Not on file  Social History Narrative   Married, has two children at home, her relatives live [place]   Husband incarcerated since 2014 for robbery, he is from [place] and may be deported next year when he gets out.    Works at [employer]    She moved to [place] 2011 for a safer place to raise her children    Her sone is homosexual and has been bullied      Current Outpatient Medications:  .  azelastine (ASTELIN) 0.1 % nasal spray, , Disp: , Rfl: 0 .  baclofen (LIORESAL) 10 MG tablet, Take 1 tablet (10 mg total) by mouth at bedtime. (Patient taking differently: Take 10 mg by mouth as needed. ), Disp: 30 each, Rfl: 0 .  FLUoxetine (PROZAC) 20 MG tablet, Take 1 tablet (20 mg total) by mouth daily., Disp: 90 tablet, Rfl: 0 .  fluticasone (FLONASE) 50 MCG/ACT nasal spray, Place 2 sprays into both nostrils daily., Disp: 16 g, Rfl: 6 .  metoprolol succinate (TOPROL-XL) 25 MG 24 hr tablet, Take 1 tablet (25 mg total) by mouth daily., Disp: 90 tablet, Rfl: 1 .  montelukast (SINGULAIR) 10 MG tablet, Take 10 mg by mouth as needed. , Disp: , Rfl:  .  Multiple Vitamin (MULTIVITAMIN WITH MINERALS) TABS tablet, Take 1 tablet by mouth daily., Disp: , Rfl:  .  albuterol (PROVENTIL HFA;VENTOLIN HFA) 108 (90 Base) MCG/ACT inhaler, Inhale 2 puffs into the lungs every 6 (six) hours as needed for wheezing or shortness of breath. (Patient not  taking: Reported on 11/09/2018), Disp: 1 Inhaler, Rfl: 2  No Known Allergies  I personally reviewed active problem list, medication list, allergies, family history, social history with the patient/caregiver today.   ROS  Ten systems reviewed and is negative except as mentioned in HPI  Plus she still has epigastric pain, waiting to see GI   Objective  Virtual encounter, vitals not obtained.  Body mass index is 33.01 kg/m.  Physical  Exam  Awake, alert, oriented, in good spirits and well groomed   PHQ2/9: Depression screen [facility] 2/9 12/08/2018 11/09/2018 09/28/2018 06/07/2018 06/07/2018  Decreased Interest 0 1 1 0 0  Down, Depressed, Hopeless 0 0 2 0 0  PHQ - 2 Score 0 1 3 0 0  Altered sleeping 1 1 3 1  0  Tired, decreased energy 1 0 3 1 0  Change in appetite 1 1 0 0 0  Feeling bad or failure about yourself  0 1 1 1  0  Trouble concentrating 1 0 2 0 0  Moving slowly or fidgety/restless 0 0 0 0 0  Suicidal thoughts 0 0 0 0 0  PHQ-9 Score 4 4 12 3  0  Difficult doing work/chores Somewhat difficult Not difficult at all Somewhat difficult Somewhat difficult Not difficult at all  Some recent data might be hidden   PHQ-2/9 Result is positive.     Fall Risk: Fall Risk  12/08/2018 11/09/2018 09/14/2018 06/07/2018 06/07/2018  Falls in the past year? 0 0 0 No No  Number falls in past yr: 0 0 0 - -  Injury with Fall? 0 0 0 - -     Assessment & Plan  1. Mild major depression (HCC)  - FLUoxetine (PROZAC) 20 MG tablet; Take 1 tablet (20 mg total) by mouth daily.  Dispense: 90 tablet; Refill: 0  2. GAD (generalized anxiety disorder)  - FLUoxetine (PROZAC) 20 MG tablet; Take 1 tablet (20 mg total) by mouth daily.  Dispense: 90 tablet; Refill: 0  3. PVC (premature ventricular contraction)  Occasional now, gave her reassurance    I discussed the assessment and treatment plan with the patient. The patient was provided an opportunity to ask questions and all were answered. The patient  agreed with the plan and demonstrated an understanding of the instructions.  The patient was advised to call back or seek an in-person evaluation if the symptoms worsen or if the condition fails to improve as anticipated.  I provided 25 minutes of non-face-to-face time during this encounter.

## 2018-12-08 NOTE — Telephone Encounter (Signed)
Called patient to switch to virtual new. In talking with patient she has [facility]. Her plan covers [facility] cardiology but not [facility]. She has appointment with [facility] as well which is in network. We agreed to cancel appointment with [facility]. She was very Adult nurse.

## 2018-12-17 ENCOUNTER — Ambulatory Visit: Payer: Self-pay | Admitting: Internal Medicine

## 2019-01-10 DIAGNOSIS — R002 Palpitations: Secondary | ICD-10-CM | POA: Diagnosis not present

## 2019-01-10 DIAGNOSIS — I10 Essential (primary) hypertension: Secondary | ICD-10-CM | POA: Diagnosis not present

## 2019-01-10 DIAGNOSIS — I341 Nonrheumatic mitral (valve) prolapse: Secondary | ICD-10-CM | POA: Diagnosis not present

## 2019-01-10 DIAGNOSIS — I493 Ventricular premature depolarization: Secondary | ICD-10-CM | POA: Diagnosis not present

## 2019-01-10 DIAGNOSIS — I340 Nonrheumatic mitral (valve) insufficiency: Secondary | ICD-10-CM | POA: Diagnosis not present

## 2019-01-11 DIAGNOSIS — I341 Nonrheumatic mitral (valve) prolapse: Secondary | ICD-10-CM | POA: Insufficient documentation

## 2019-01-12 DIAGNOSIS — R1314 Dysphagia, pharyngoesophageal phase: Secondary | ICD-10-CM | POA: Diagnosis not present

## 2019-01-12 DIAGNOSIS — K219 Gastro-esophageal reflux disease without esophagitis: Secondary | ICD-10-CM | POA: Diagnosis not present

## 2019-01-19 ENCOUNTER — Ambulatory Visit (INDEPENDENT_AMBULATORY_CARE_PROVIDER_SITE_OTHER): Payer: BLUE CROSS/BLUE SHIELD | Admitting: Family Medicine

## 2019-01-19 ENCOUNTER — Encounter: Payer: Self-pay | Admitting: Family Medicine

## 2019-01-19 DIAGNOSIS — S1086XA Insect bite of other specified part of neck, initial encounter: Secondary | ICD-10-CM | POA: Diagnosis not present

## 2019-01-19 DIAGNOSIS — W57XXXA Bitten or stung by nonvenomous insect and other nonvenomous arthropods, initial encounter: Secondary | ICD-10-CM | POA: Diagnosis not present

## 2019-01-19 MED ORDER — MUPIROCIN 2 % EX OINT: 1.0000 "application " | TOPICAL_OINTMENT | Freq: Two times a day (BID) | CUTANEOUS | 0 refills | Status: DC

## 2019-01-19 NOTE — Progress Notes (Signed)
Name: Bethany [name]   MRN: [id number]    DOB: 01-Nov-1980   Date:01/19/2019       Progress Note  Subjective  Chief Complaint  Chief Complaint  Patient presents with  . Insect Bite    thinks that she was bitten by a spider x 2 days ago. She thought it was a misquito bite. She started feeling a knot in her lymph nodes. Site is red and swollen, it has decreased in size. She has been treating bite with alcohol daily.    I connected with  Bethany BanasSonya Yazdi  on 01/19/19 at  9:00 AM EDT by a video enabled telemedicine application and verified that I am speaking with the correct person using two identifiers.  I discussed the limitations of evaluation and management by telemedicine and the availability of in person appointments. The patient expressed understanding and agreed to proceed. Staff also discussed with the patient that there may be a patient responsible charge related to this service. Patient Location: Home Provider Location: Home Additional Individuals present: None  HPI  Pt presents with concern for insect bite.  She traveled back from [place] a few days ago and noticed a bite on the LEFT lateral neck that is red and irritated, and noticed her lymph nodes in the posterior left neck is swollen and a bit tender.  No fevers/chills, no surrounding rash, malaise, NVD, fatigue, myalgias, or headache.   Patient Active Problem List   Diagnosis Date Noted  . Mitral valve prolapse 01/11/2019  . Hypertension, benign 09/14/2018  . Chronic left hip pain 12/04/2017  . Smell disturbance 12/04/2017  . Obesity, Class II, BMI 35-39.9 12/04/2017  . Abnormal finding on MRI of brain 11/05/2017  . 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 Patellofemoral stress syndrome of both knees 08/09/2014    Social History   Tobacco Use  . Smoking status: Never Smoker  . Smokeless tobacco: Never Used  Substance Use Topics  . Alcohol use: Not Currently    Alcohol/week: 0.0 standard drinks    Comment:  occasionally     Current Outpatient Medications:  .  metoprolol succinate (TOPROL-XL) 25 MG 24 hr tablet, Take 1 tablet (25 mg total) by mouth daily., Disp: 90 tablet, Rfl: 1 .  Multiple Vitamin (MULTIVITAMIN WITH MINERALS) TABS tablet, Take 1 tablet by mouth daily., Disp: , Rfl:   No Known Allergies  I personally reviewed active problem list, medication list, allergies with the patient/caregiver today.  ROS  Ten systems reviewed and is negative except as mentioned in HPI  Objective  Virtual encounter, vitals not obtained.  There is no height or weight on file to calculate BMI.  Nursing Note and Vital Signs reviewed.  Physical Exam  Constitutional: Patient appears well-developed and well-nourished. No distress.  HENT: Head: Normocephalic and atraumatic.  Neck: Normal range of motion. Pulmonary/Chest: Effort normal. No respiratory distress. Speaking in complete sentences Neurological: Pt is alert and oriented to person, place, and time. Coordination, speech and gait are normal.  Psychiatric: Patient has a normal mood and affect. behavior is normal. Judgment and thought content normal. Skin: There is a single raised erythematous lesion on the LEFT posterior neck that is consistent with insect bite.   No results found for this or any previous visit (from the past 72 hour(s)).  Assessment & Plan  1. Insect bite of other part of neck, initial encounter - Advised she monitor the enlarged lymph node, if not resolved after insect bite has healed, she will call our  office and we will trial oral Abx.  If still not resolved, will refer to surgery for evaluation.  Did discuss S&Sx's of RMSF and Lyme in detail - she will call if she develops any of these symptoms.  - mupirocin ointment (BACTROBAN) 2 %; Place 1 application into the nose 2 (two) times daily.  Dispense: 22 g; Refill: 0  -Red flags and when to present for emergency care or RTC including fever >101.44F, chest pain, shortness  of breath, new/worsening/un-resolving symptoms, reviewed with patient at time of visit. Follow up and care instructions discussed and provided in AVS. - I discussed the assessment and treatment plan with the patient. The patient was provided an opportunity to ask questions and all were answered. The patient agreed with the plan and demonstrated an understanding of the instructions.  I provided 14 minutes of non-face-to-face time during this encounter.  Hubbard Hartshorn, FNP

## 2019-01-24 ENCOUNTER — Ambulatory Visit: Payer: BLUE CROSS/BLUE SHIELD | Admitting: Neurology

## 2019-01-31 DIAGNOSIS — R002 Palpitations: Secondary | ICD-10-CM | POA: Diagnosis not present

## 2019-02-15 ENCOUNTER — Other Ambulatory Visit: Payer: Self-pay

## 2019-02-15 ENCOUNTER — Encounter: Payer: Self-pay | Admitting: Family Medicine

## 2019-02-15 ENCOUNTER — Ambulatory Visit (INDEPENDENT_AMBULATORY_CARE_PROVIDER_SITE_OTHER): Payer: BLUE CROSS/BLUE SHIELD | Admitting: Family Medicine

## 2019-02-15 VITALS — BP 130/80 | HR 87 | Temp 97.7°F | Resp 16 | Ht 60.0 in | Wt 170.1 lb

## 2019-02-15 DIAGNOSIS — I341 Nonrheumatic mitral (valve) prolapse: Secondary | ICD-10-CM | POA: Diagnosis not present

## 2019-02-15 DIAGNOSIS — I10 Essential (primary) hypertension: Secondary | ICD-10-CM | POA: Diagnosis not present

## 2019-02-15 DIAGNOSIS — E669 Obesity, unspecified: Secondary | ICD-10-CM | POA: Diagnosis not present

## 2019-02-15 DIAGNOSIS — F320 Major depressive disorder, single episode, mild: Secondary | ICD-10-CM

## 2019-02-15 NOTE — Progress Notes (Signed)
Name: Cherylann BanasSonya Henes   MRN: [id number]    DOB: [birth year]   Date:02/15/2019       Progress Note  Subjective  Chief Complaint  Chief Complaint  Patient presents with  . Depression    HPI  MDD/Anxiety: she tried medication Duloxetin, Prozac and hydroxyzine but had side effects and stopped it on her own.  She still has stressors in her life, lost her job due to COVID-19, she is worried about her mother that has a COPD flare, step father recently fell and injured both knee, also her father is current in [place] at a hospital and she is not sure why. She is trying to stay on a healthy routine, getting up early, going for walks daily, eating healthy to try to stay sane. She has been traveling back and forth to [place] to check on her parents. She states husband will be out of prison in a few months but may be deported since he is an immigrant from [place].   Epigastric pain: she is on probiotics now, she states not as intense, she was seen by GI and advised to have EGD but had to be postpone secondary to travel to see parents, however since pain has resolved she is not sure if she wants to get it done   Migraine and left arm numbness: taking magnesium and symptoms have improved/ Unchanged   PVC: taking metoprolol and states very seldom has palpitation, that  lasts a few seconds, not related to chest pain or SOB and resolves by itself. Episodes about once a week. [facility], echo below 01/11/2019   Result Narrative   Normal left ventricular systolic function, ejection fraction > 55%  Mitral valve prolapse - moderate  Mitral regurgitation - mild  Normal right ventricular systolic function   Holter event monitor ( June 2020 )   Patient had a min HR of 36 bpm, max HR of 154 bpm, and avg HR of 72 bpm. Predominant underlying rhythm was Sinus Rhythm. Isolated SVEs were rare (<1.0%), SVE Couplets were rare (<1.0%), and no SVE Triplets were present. Isolated VEs were  occasional (1.3%, 18369), VE Couplets were rare (<1.0%, 39), and VE Triplets were rare (<1.0%, 4). Ventricular Bigeminy and Trigeminy were present. One patient triggered event was associated with sinus rhythm.   Patient Active Problem List   Diagnosis Date Noted  . Mitral valve prolapse 01/11/2019  . Hypertension, benign 09/14/2018  . Chronic left hip pain 12/04/2017  . Smell disturbance 12/04/2017  . Obesity, Class II, BMI 35-39.9 12/04/2017  . Abnormal finding on MRI of brain 11/05/2017  . 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 Patellofemoral stress syndrome of both knees 08/09/2014    Past Surgical History:  Procedure Laterality Date  . CESAREAN SECTION  2002  . CESAREAN SECTION  2005    Family History  Problem Relation Age of Onset  . Emphysema Mother   . Hypercalcemia Mother   . Hypertension Mother   . Sickle cell trait Mother   . Clotting disorder Mother   . Prostate cancer Father   . Diabetes Mellitus II Father   . Hypertension Father   . Heart Problems Father        Enlarged Heart  . Thyroid disease Sister   . Asthma Paternal Grandmother   . Lung cancer Maternal Uncle   . Lung cancer Maternal Aunt   . Breast cancer Maternal Aunt        early 7460's  . Kidney disease  Paternal Aunt     Social History   Socioeconomic History  . Marital status: Married    Spouse name: Casimiro NeedleMichael   . Number of children: 2  . Years of education: Not on file  . Highest education level: Some college, no degree  Occupational History  . Occupation: food service    Comment: at [employer]   Social Needs  . Financial resource strain: Somewhat hard  . Food insecurity    Worry: Never true    Inability: Never true  . Transportation needs    Medical: No    Non-medical: No  Tobacco Use  . Smoking status: Never Smoker  . Smokeless tobacco: Never Used  Substance and Sexual Activity  . Alcohol use: Not Currently    Alcohol/week: 0.0 standard drinks    Comment: occasionally   . Drug use: No  . Sexual activity: Not Currently    Partners: Male    Birth control/protection: None  Lifestyle  . Physical activity    Days per week: 0 days    Minutes per session: 0 min  . Stress: Very much  Relationships  . Social connections    Talks on phone: More than three times a week    Gets together: Never    Attends religious service: More than 4 times per year    Active member of club or organization: No    Attends meetings of clubs or organizations: Never    Relationship status: Married  . Intimate partner violence    Fear of current or ex partner: No    Emotionally abused: No    Physically abused: No    Forced sexual activity: No  Other Topics Concern  . Not on file  Social History Narrative   Married, has two children at home, her relatives live [place]   Husband incarcerated since 2014 for robbery, he is from [place] and may be deported next year when he gets out.    Works at [employer]    She moved to [place] for a safer place to raise her children    Her sone is homosexual and has been bullied      Current Outpatient Medications:  .  metoprolol succinate (TOPROL-XL) 25 MG 24 hr tablet, Take 1 tablet (25 mg total) by mouth daily., Disp: 90 tablet, Rfl: 1 .  Multiple Vitamin (MULTIVITAMIN WITH MINERALS) TABS tablet, Take 1 tablet by mouth daily., Disp: , Rfl:  .  mupirocin ointment (BACTROBAN) 2 %, Place 1 application into the nose 2 (two) times daily. (Patient not taking: Reported on 02/15/2019), Disp: 22 g, Rfl: 0  No Known Allergies  I personally reviewed active problem list, medication list, allergies, family history, social history with the patient/caregiver today.   ROS  Constitutional: Negative for fever or weight change.  Respiratory: Negative for cough and shortness of breath.   Cardiovascular: Negative for chest pain or palpitations.  Gastrointestinal: Negative for abdominal pain, no bowel changes.  Musculoskeletal: Negative for gait  problem or joint swelling.  Skin: Negative for rash.  Neurological: Negative for dizziness or headache.  No other specific complaints in a complete review of systems (except as listed in HPI above).  Objective  Vitals:   02/15/19 1118  BP: 130/80  Pulse: 87  Resp: 16  Temp: 97.7 F (36.5 C)  TempSrc: Oral  SpO2: 98%  Weight: 170 lb 1.6 oz (77.2 kg)  Height: 5' (1.524 m)    Body mass index is 33.22 kg/m.  Physical Exam  Constitutional: Patient appears well-developed and well-nourished. Obese No distress.  HEENT: head atraumatic, normocephalic, pupils equal and reactive to light, neck supple Cardiovascular: Normal rate, regular rhythm and normal heart sounds.  No murmur heard. No BLE edema. Pulmonary/Chest: Effort normal and breath sounds normal. No respiratory distress. Abdominal: Soft.  There is no tenderness. Psychiatric: Patient has a normal mood and affect. behavior is normal. Judgment and thought content normal.  Recent Results (from the past 2160 hour(s))  Magnesium     Status: None   Collection Time: 11/29/18 11:39 AM  Result Value Ref Range   Magnesium 1.9 1.5 - 2.5 mg/dL  Lipid panel     Status: None   Collection Time: 11/29/18 11:39 AM  Result Value Ref Range   Cholesterol 151 <200 mg/dL   HDL 55 > OR = 50 mg/dL   Triglycerides 960103 <454<150 mg/dL   LDL Cholesterol (Calc) 77 mg/dL (calc)    Comment: Reference range: <100 . Desirable range <100 mg/dL for primary prevention;   <70 mg/dL for patients with CHD or diabetic patients  with > or = 2 CHD risk factors. Marland Kitchen. LDL-C is now calculated using the Martin-Hopkins  calculation, which is a validated novel method providing  better accuracy than the Friedewald equation in the  estimation of LDL-C.  Horald PollenMartin SS et al. Lenox AhrJAMA. 0981;191(472013;310(19): 2061-2068  (http://education.[facility].com/faq/FAQ164)    Total CHOL/HDL Ratio 2.7 <5.0 (calc)   Non-HDL Cholesterol (Calc) 96 <829<130 mg/dL (calc)    Comment: For patients  with diabetes plus 1 major ASCVD risk  factor, treating to a non-HDL-C goal of <100 mg/dL  (LDL-C of <56<70 mg/dL) is considered a therapeutic  option.   Potassium     Status: None   Collection Time: 11/29/18 11:39 AM  Result Value Ref Range   Potassium 4.1 3.5 - 5.3 mmol/L  Hemoglobin A1c     Status: None   Collection Time: 11/29/18 11:39 AM  Result Value Ref Range   Hgb A1c MFr Bld 5.4 <5.7 % of total Hgb    Comment: For the purpose of screening for the presence of diabetes: . <5.7%       Consistent with the absence of diabetes 5.7-6.4%    Consistent with increased risk for diabetes             (prediabetes) > or =6.5%  Consistent with diabetes . This assay result is consistent with a decreased risk of diabetes. . Currently, no consensus exists regarding use of hemoglobin A1c for diagnosis of diabetes in children. . According to American Diabetes Association (ADA) guidelines, hemoglobin A1c <7.0% represents optimal control in non-pregnant diabetic patients. Different metrics may apply to specific patient populations.  Standards of Medical Care in Diabetes(ADA). .    Mean Plasma Glucose 108 (calc)   eAG (mmol/L) 6.0 (calc)      PHQ2/9: Depression screen [facility] 2/9 02/15/2019 01/19/2019 12/08/2018 11/09/2018 09/28/2018  Decreased Interest 1 0 0 1 1  Down, Depressed, Hopeless 0 0 0 0 2  PHQ - 2 Score 1 0 0 1 3  Altered sleeping 1 0 1 1 3   Tired, decreased energy 0 1 1 0 3  Change in appetite 0 0 1 1 0  Feeling bad or failure about yourself  0 0 0 1 1  Trouble concentrating 0 0 1 0 2  Moving slowly or fidgety/restless 0 0 0 0 0  Suicidal thoughts 0 0 0 0 0  PHQ-9 Score 2 1 4 4 12   Difficult doing work/chores -  Somewhat difficult Somewhat difficult Not difficult at all Somewhat difficult  Some recent data might be hidden    phq 9 is positive   Fall Risk: Fall Risk  02/15/2019 01/19/2019 12/08/2018 11/09/2018 09/14/2018  Falls in the past year? 0 0 0 0 0  Number falls in past  yr: 0 0 0 0 0  Injury with Fall? 0 0 0 0 0     Functional Status Survey: Is the patient deaf or have difficulty hearing?: No Does the patient have difficulty seeing, even when wearing glasses/contacts?: No Does the patient have difficulty concentrating, remembering, or making decisions?: No Does the patient have difficulty walking or climbing stairs?: No Does the patient have difficulty dressing or bathing?: No Does the patient have difficulty doing errands alone such as visiting a doctor's office or shopping?: No    Assessment & Plan  1. Mild major depression ([place])  Coping better  2. Hypertension, benign  At goal   3. Mitral valve prolapse  Seen by Cardiologist   4. Obesity, Class II, BMI 35-39.9  Doing well

## 2019-02-27 DIAGNOSIS — Z1159 Encounter for screening for other viral diseases: Secondary | ICD-10-CM | POA: Diagnosis not present

## 2019-04-21 ENCOUNTER — Ambulatory Visit (INDEPENDENT_AMBULATORY_CARE_PROVIDER_SITE_OTHER): Payer: BLUE CROSS/BLUE SHIELD | Admitting: Obstetrics and Gynecology

## 2019-04-21 ENCOUNTER — Other Ambulatory Visit: Payer: Self-pay

## 2019-04-21 ENCOUNTER — Encounter: Payer: Self-pay | Admitting: Obstetrics and Gynecology

## 2019-04-21 VITALS — Ht 60.0 in | Wt 166.0 lb

## 2019-04-21 DIAGNOSIS — R102 Pelvic and perineal pain: Secondary | ICD-10-CM | POA: Diagnosis not present

## 2019-04-21 NOTE — Patient Instructions (Signed)
Therapists/Counselors/Psychologists   Karen Canada, LPC  & Michelle Van Horton    [phone]        [address]       [place], [place] [address]        Gary Bailey, CSW [phone] [address] [place], [place] [address]  Julia Tabor, LPC        [phone]        [address]      [place], [place] [address]        [place], [place] [phone] [address] [place], [place] [address]   Joanna Warren, LMFT       [phone]        [address]       [place], [place] [address]        Tina Thompson [phone] [address] [place], [place] [address]  Amanda Miller        [phone]        [address]       [place], [place] [address]        Bart McCormick [phone] [address] [place], [place] [address]  Cristin Saffo, PsyD       [phone]        [address]       [place], [place] [address]        Cheryl Lawson, LPC [phone] [address]  [place], [place] [address]   Courtney Jones        [phone]        [address]      [place], [place] [address]         Laura Ellington [facility] [phone] [email or web address]   Sation Konchella       [phone]        [address]       ,  [address]        Carmen Bork [facility] [phone] [email or web address]    

## 2019-04-22 DIAGNOSIS — R102 Pelvic and perineal pain: Secondary | ICD-10-CM | POA: Diagnosis not present

## 2019-04-22 NOTE — Progress Notes (Signed)
Patient ID: Bethany [name], female   DOB: [birth year], 38 y.o.   MRN: [id number]  Reason for Consult: Vaginitis (Vaginal tingling, hurts with coughing, had had PID before thought may be that )   Referred by Steele Sizer, MD  Subjective:     HPI:  Bethany [name] is a 38 y.o. female . She reports that this week she had a right sided abdominal pain. She was concerned that she may have PID and wanted to be evaluated for that. She denies abnormal vaginal discharge.  She denies fevers.   She reports that she has had a lot of personal stress recently. Her father has been ill for many months and is in another state. He has been in and out of 5 hospital over the last year. She is traveling next week to make arrangements for him.  She is going through a divorce currently. Her partner has been in jail for over 6 years and the divorce has been very upsetting to her children.  She just checked her 81 yo daughter into the psych unit related to her distress regarding the divorce. 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     Past Medical History:  Diagnosis Date  . Diabetes mellitus without complication ([place])   . Hypertension    Family History  Problem Relation Age of Onset  . Emphysema Mother   . Hypercalcemia Mother   . Hypertension Mother   . Sickle cell trait Mother   . Clotting disorder Mother   . Prostate cancer Father   . Diabetes Mellitus II Father   . Hypertension Father   . Heart Problems Father        Enlarged Heart  . Thyroid disease Sister   . Asthma Paternal Grandmother   . Lung cancer Maternal Uncle   . Lung cancer Maternal Aunt   . Breast cancer Maternal Aunt        early 13's  . Kidney disease Paternal Aunt    Past Surgical History:  Procedure Laterality Date  . CESAREAN SECTION  2002  . CESAREAN SECTION  2005    Short Social History:  Social History   Tobacco Use  . Smoking status: Never Smoker  . Smokeless tobacco: Never  Used  Substance Use Topics  . Alcohol use: Not Currently    Alcohol/week: 0.0 standard drinks    Comment: occasionally    Allergies  Allergen Reactions  . Pantoprazole     Pain in left arm    Current Outpatient Medications  Medication Sig Dispense Refill  . metoprolol succinate (TOPROL-XL) 25 MG 24 hr tablet Take 1 tablet (25 mg total) by mouth daily. 90 tablet 1  . Multiple Vitamin (MULTIVITAMIN WITH MINERALS) TABS tablet Take 1 tablet by mouth daily.     No current facility-administered medications for this visit.     Review of Systems  Constitutional: Negative for chills, fatigue, fever and unexpected weight change.  HENT: Negative for trouble swallowing.  Eyes: Negative for loss of vision.  Respiratory: Negative for cough, shortness of breath and wheezing.  Cardiovascular: Negative for chest pain, leg swelling, palpitations and syncope.  GI: Negative for abdominal pain, blood in stool, diarrhea, nausea and vomiting.  GU: Negative for difficulty urinating, dysuria, frequency and hematuria.  Musculoskeletal: Negative for back pain, leg pain and joint pain.  Skin: Negative for rash.  Neurological: Negative for dizziness, headaches, light-headedness, numbness and seizures.  Psychiatric: Negative for behavioral problem, confusion, depressed mood  and sleep disturbance.        Objective:  Objective   Vitals:   04/21/19 1703  Weight: 166 lb (75.3 kg)  Height: 5' (1.524 m)   Body mass index is 32.42 kg/m.  Physical Exam Vitals signs and nursing note reviewed.  Constitutional:      Appearance: She is well-developed.  HENT:     Head: Normocephalic and atraumatic.  Eyes:     Pupils: Pupils are equal, round, and reactive to light.  Cardiovascular:     Rate and Rhythm: Normal rate and regular rhythm.  Pulmonary:     Effort: Pulmonary effort is normal. No respiratory distress.  Genitourinary:    Comments: Right pelvic fullness. Mild cervical tenderness. No chandelier  sign. Normal scant white vaginal discharge. Normal appearance of labia and cervix. Normal left adnexal. Uterus normal and midline.  Skin:    General: Skin is warm and dry.  Neurological:     Mental Status: She is alert and oriented to person, place, and time.  Psychiatric:        Behavior: Behavior normal.        Thought Content: Thought content normal.        Judgment: Judgment normal.          Assessment/Plan:     38 yo [id number] with right sided abdominal pain.  Offered presumptive PID treatment today but she declines.  She will follow up next week for a pelvic US.  Nuswab sent and pending.  Offered emotional support. Encouraged her to seek out therapy as she goes through this difficult and challenging personal time. Given therapist list.  More than 30 minutes were spent face to face with the patient in the room with more than 50% of the time spent providing counseling and discussing the plan of management.      Adelene Idlerhristanna Milik Gilreath MD [facility], [facility] 04/22/2019 1:26 PM

## 2019-04-27 ENCOUNTER — Ambulatory Visit: Payer: BLUE CROSS/BLUE SHIELD | Admitting: Obstetrics and Gynecology

## 2019-04-27 ENCOUNTER — Ambulatory Visit: Payer: BLUE CROSS/BLUE SHIELD | Admitting: Family Medicine

## 2019-04-27 ENCOUNTER — Ambulatory Visit: Payer: BLUE CROSS/BLUE SHIELD

## 2019-04-27 LAB — NUSWAB VAGINITIS PLUS (VG+)
Atopobium vaginae: HIGH Score — AB
BVAB 2: HIGH Score — AB
Candida albicans, NAA: NEGATIVE
Candida glabrata, NAA: NEGATIVE
Chlamydia trachomatis, NAA: NEGATIVE
Megasphaera 1: HIGH Score — AB
Neisseria gonorrhoeae, NAA: NEGATIVE
Trich vag by NAA: NEGATIVE

## 2019-04-28 ENCOUNTER — Ambulatory Visit: Payer: BLUE CROSS/BLUE SHIELD | Admitting: Obstetrics and Gynecology

## 2019-04-28 ENCOUNTER — Other Ambulatory Visit: Payer: Self-pay | Admitting: Obstetrics and Gynecology

## 2019-04-28 DIAGNOSIS — B3731 Acute candidiasis of vulva and vagina: Secondary | ICD-10-CM

## 2019-04-28 DIAGNOSIS — B373 Candidiasis of vulva and vagina: Secondary | ICD-10-CM

## 2019-04-28 DIAGNOSIS — B9689 Other specified bacterial agents as the cause of diseases classified elsewhere: Secondary | ICD-10-CM

## 2019-04-28 MED ORDER — FLUCONAZOLE 150 MG PO TABS: 150.0000 mg | ORAL_TABLET | ORAL | 0 refills | Status: AC

## 2019-04-28 MED ORDER — METRONIDAZOLE 500 MG PO TABS: 500.0000 mg | ORAL_TABLET | Freq: Two times a day (BID) | ORAL | 0 refills | Status: AC

## 2019-04-28 NOTE — Progress Notes (Signed)
BV- rx for flagyl sent. Released to [facility]

## 2019-05-18 ENCOUNTER — Ambulatory Visit (INDEPENDENT_AMBULATORY_CARE_PROVIDER_SITE_OTHER): Payer: BLUE CROSS/BLUE SHIELD | Admitting: Family Medicine

## 2019-05-18 ENCOUNTER — Ambulatory Visit: Payer: BLUE CROSS/BLUE SHIELD | Admitting: Family Medicine

## 2019-05-18 ENCOUNTER — Encounter: Payer: Self-pay | Admitting: Family Medicine

## 2019-05-18 ENCOUNTER — Other Ambulatory Visit: Payer: Self-pay

## 2019-05-18 VITALS — BP 116/70 | HR 63 | Temp 97.3°F | Resp 16 | Ht 60.0 in | Wt 164.1 lb

## 2019-05-18 DIAGNOSIS — I341 Nonrheumatic mitral (valve) prolapse: Secondary | ICD-10-CM

## 2019-05-18 DIAGNOSIS — I10 Essential (primary) hypertension: Secondary | ICD-10-CM

## 2019-05-18 DIAGNOSIS — R7303 Prediabetes: Secondary | ICD-10-CM

## 2019-05-18 DIAGNOSIS — F411 Generalized anxiety disorder: Secondary | ICD-10-CM | POA: Diagnosis not present

## 2019-05-18 DIAGNOSIS — G43009 Migraine without aura, not intractable, without status migrainosus: Secondary | ICD-10-CM

## 2019-05-18 DIAGNOSIS — F320 Major depressive disorder, single episode, mild: Secondary | ICD-10-CM

## 2019-05-18 DIAGNOSIS — R002 Palpitations: Secondary | ICD-10-CM

## 2019-05-18 MED ORDER — METOPROLOL SUCCINATE ER 25 MG PO TB24: 25.0000 mg | ORAL_TABLET | Freq: Every day | ORAL | 1 refills | Status: DC
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
## 2019-05-26 DIAGNOSIS — E559 Vitamin D deficiency, unspecified: Secondary | ICD-10-CM | POA: Diagnosis not present

## 2019-05-26 DIAGNOSIS — E538 Deficiency of other specified B group vitamins: Secondary | ICD-10-CM | POA: Diagnosis not present

## 2019-05-26 DIAGNOSIS — F4321 Adjustment disorder with depressed mood: Secondary | ICD-10-CM | POA: Diagnosis not present

## 2019-06-06 ENCOUNTER — Encounter: Payer: Self-pay | Admitting: Family Medicine

## 2019-07-20 ENCOUNTER — Encounter: Payer: Self-pay | Admitting: Family Medicine

## 2019-09-07 ENCOUNTER — Ambulatory Visit: Payer: BLUE CROSS/BLUE SHIELD | Admitting: Family Medicine

## 2019-09-18 ENCOUNTER — Other Ambulatory Visit: Payer: Self-pay | Admitting: Family Medicine

## 2019-09-18 DIAGNOSIS — I10 Essential (primary) hypertension: Secondary | ICD-10-CM

## 2019-09-18 DIAGNOSIS — R002 Palpitations: Secondary | ICD-10-CM

## 2019-09-20 ENCOUNTER — Ambulatory Visit: Payer: BLUE CROSS/BLUE SHIELD | Admitting: Family Medicine

## 2019-09-20 ENCOUNTER — Other Ambulatory Visit: Payer: Self-pay | Admitting: Family Medicine

## 2019-09-20 DIAGNOSIS — R002 Palpitations: Secondary | ICD-10-CM

## 2019-09-20 DIAGNOSIS — I10 Essential (primary) hypertension: Secondary | ICD-10-CM

## 2019-11-16 ENCOUNTER — Encounter: Payer: Self-pay | Admitting: Family Medicine

## 2019-11-16 ENCOUNTER — Ambulatory Visit (INDEPENDENT_AMBULATORY_CARE_PROVIDER_SITE_OTHER): Payer: 59 | Admitting: Family Medicine

## 2019-11-16 ENCOUNTER — Other Ambulatory Visit: Payer: Self-pay

## 2019-11-16 VITALS — BP 134/78 | HR 88 | Temp 97.7°F | Resp 16 | Ht 60.0 in | Wt 172.7 lb

## 2019-11-16 DIAGNOSIS — Z0000 Encounter for general adult medical examination without abnormal findings: Secondary | ICD-10-CM | POA: Diagnosis not present

## 2019-11-16 DIAGNOSIS — Z131 Encounter for screening for diabetes mellitus: Secondary | ICD-10-CM

## 2019-11-16 DIAGNOSIS — G43009 Migraine without aura, not intractable, without status migrainosus: Secondary | ICD-10-CM

## 2019-11-16 DIAGNOSIS — E669 Obesity, unspecified: Secondary | ICD-10-CM

## 2019-11-16 DIAGNOSIS — R7303 Prediabetes: Secondary | ICD-10-CM | POA: Diagnosis not present

## 2019-11-16 DIAGNOSIS — E66812 Obesity, class 2: Secondary | ICD-10-CM

## 2019-11-16 DIAGNOSIS — E559 Vitamin D deficiency, unspecified: Secondary | ICD-10-CM

## 2019-11-16 DIAGNOSIS — I493 Ventricular premature depolarization: Secondary | ICD-10-CM

## 2019-11-16 DIAGNOSIS — I10 Essential (primary) hypertension: Secondary | ICD-10-CM

## 2019-11-16 DIAGNOSIS — F320 Major depressive disorder, single episode, mild: Secondary | ICD-10-CM

## 2019-11-16 DIAGNOSIS — F411 Generalized anxiety disorder: Secondary | ICD-10-CM | POA: Diagnosis not present

## 2019-11-16 DIAGNOSIS — Z13220 Encounter for screening for lipoid disorders: Secondary | ICD-10-CM

## 2019-11-16 DIAGNOSIS — R002 Palpitations: Secondary | ICD-10-CM

## 2019-11-16 DIAGNOSIS — Z130 Encounter for screening for diseases of the blood and blood-forming organs and certain disorders involving the immune mechanism: Secondary | ICD-10-CM

## 2019-11-16 DIAGNOSIS — Z79899 Other long term (current) drug therapy: Secondary | ICD-10-CM

## 2019-11-16 MED ORDER — METOPROLOL SUCCINATE ER 25 MG PO TB24: 25.0000 mg | ORAL_TABLET | Freq: Every day | ORAL | 1 refills | Status: DC
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
## 2019-11-16 NOTE — Patient Instructions (Signed)

## 2019-11-17 LAB — CBC WITH DIFFERENTIAL/PLATELET
Absolute Monocytes: 385 cells/uL (ref 200–950)
Basophils Absolute: 8 cells/uL (ref 0–200)
Basophils Relative: 0.2 %
Eosinophils Absolute: 49 cells/uL (ref 15–500)
Eosinophils Relative: 1.2 %
HCT: 37.8 % (ref 35.0–45.0)
Hemoglobin: 12.1 g/dL (ref 11.7–15.5)
Lymphs Abs: 1788 cells/uL (ref 850–3900)
MCH: 27.9 pg (ref 27.0–33.0)
MCHC: 32 g/dL (ref 32.0–36.0)
MCV: 87.1 fL (ref 80.0–100.0)
MPV: 10 fL (ref 7.5–12.5)
Monocytes Relative: 9.4 %
Neutro Abs: 1870 cells/uL (ref 1500–7800)
Neutrophils Relative %: 45.6 %
Platelets: 214 10*3/uL (ref 140–400)
RBC: 4.34 10*6/uL (ref 3.80–5.10)
RDW: 12.7 % (ref 11.0–15.0)
Total Lymphocyte: 43.6 %
WBC: 4.1 10*3/uL (ref 3.8–10.8)

## 2019-11-17 LAB — COMPLETE METABOLIC PANEL WITH GFR
AG Ratio: 1.5 (calc) (ref 1.0–2.5)
ALT: 18 U/L (ref 6–29)
AST: 17 U/L (ref 10–30)
Albumin: 4.1 g/dL (ref 3.6–5.1)
Alkaline phosphatase (APISO): 35 U/L (ref 31–125)
BUN: 9 mg/dL (ref 7–25)
CO2: 29 mmol/L (ref 20–32)
Calcium: 9.4 mg/dL (ref 8.6–10.2)
Chloride: 105 mmol/L (ref 98–110)
Creat: 0.89 mg/dL (ref 0.50–1.10)
GFR, Est African American: 94 mL/min/{1.73_m2} (ref >=60)
GFR, Est Non African American: 81 mL/min/{1.73_m2} (ref >=60)
Globulin: 2.7 g/dL (calc) (ref 1.9–3.7)
Glucose, Bld: 90 mg/dL (ref 65–99)
Potassium: 4.2 mmol/L (ref 3.5–5.3)
Sodium: 140 mmol/L (ref 135–146)
Total Bilirubin: 0.7 mg/dL (ref 0.2–1.2)
Total Protein: 6.8 g/dL (ref 6.1–8.1)

## 2019-11-17 LAB — HEMOGLOBIN A1C
Hgb A1c MFr Bld: 5.4 % of total Hgb (ref <5.7)
Mean Plasma Glucose: 108 (calc)
eAG (mmol/L): 6 (calc)

## 2019-11-17 LAB — LIPID PANEL
Cholesterol: 155 mg/dL (ref <200)
HDL: 63 mg/dL (ref >=50)
LDL Cholesterol (Calc): 78 mg/dL (calc)
Non-HDL Cholesterol (Calc): 92 mg/dL (calc) (ref <130)
Total CHOL/HDL Ratio: 2.5 (calc) (ref <5.0)
Triglycerides: 63 mg/dL (ref <150)

## 2019-11-17 LAB — VITAMIN D 25 HYDROXY (VIT D DEFICIENCY, FRACTURES): Vit D, 25-Hydroxy: 39 ng/mL (ref 30–100)

## 2019-12-09 ENCOUNTER — Other Ambulatory Visit: Payer: Self-pay | Admitting: Family Medicine

## 2019-12-09 DIAGNOSIS — R002 Palpitations: Secondary | ICD-10-CM

## 2019-12-09 DIAGNOSIS — I10 Essential (primary) hypertension: Secondary | ICD-10-CM

## 2019-12-09 NOTE — Telephone Encounter (Signed)
Requested Prescriptions  Pending Prescriptions Disp Refills  . metoprolol succinate (TOPROL-XL) 25 MG 24 hr tablet [Pharmacy Med Name: Metoprolol Succinate ER 25 MG Oral Tablet Extended Release 24 Hour] 90 tablet 0    Sig: Take 1 tablet by mouth once daily     Cardiovascular:  Beta Blockers Passed - 12/09/2019  5:15 PM      Passed - Last BP in normal range    BP Readings from Last 1 Encounters:  11/16/19 134/78         Passed - Last Heart Rate in normal range    Pulse Readings from Last 1 Encounters:  11/16/19 88         Passed - Valid encounter within last 6 months    Recent Outpatient Visits          3 weeks ago Hypertension, benign   [facility] Provencal, Danna Hefty, MD   6 months ago Mild major depression [facility])   [facility] Alba Cory, MD   9 months ago Mild major depression [facility])   [facility] Alba Cory, MD   10 months ago Insect bite of other part of neck, initial encounter   [facility] Doren Custard, FNP   1 year ago Mild major depression [facility])   [facility] Alba Cory, MD      Future Appointments            In 5 days Alba Cory, MD [facility]           Spoke with pharmacy

## 2019-12-14 ENCOUNTER — Other Ambulatory Visit: Payer: Self-pay | Admitting: Family Medicine

## 2019-12-14 ENCOUNTER — Other Ambulatory Visit: Payer: Self-pay

## 2019-12-14 ENCOUNTER — Ambulatory Visit (INDEPENDENT_AMBULATORY_CARE_PROVIDER_SITE_OTHER): Payer: 59 | Admitting: Family Medicine

## 2019-12-14 ENCOUNTER — Encounter: Payer: Self-pay | Admitting: Family Medicine

## 2019-12-14 VITALS — BP 136/86 | HR 78 | Temp 97.5°F | Resp 16 | Ht 60.0 in | Wt 171.7 lb

## 2019-12-14 DIAGNOSIS — M79669 Pain in unspecified lower leg: Secondary | ICD-10-CM

## 2019-12-14 DIAGNOSIS — R7989 Other specified abnormal findings of blood chemistry: Secondary | ICD-10-CM

## 2019-12-14 DIAGNOSIS — I10 Essential (primary) hypertension: Secondary | ICD-10-CM

## 2019-12-14 DIAGNOSIS — R002 Palpitations: Secondary | ICD-10-CM

## 2019-12-14 MED ORDER — METOPROLOL SUCCINATE ER 25 MG PO TB24: 25.0000 mg | ORAL_TABLET | Freq: Every day | ORAL | 1 refills | Status: DC

## 2019-12-14 NOTE — Progress Notes (Signed)
Name: Bethany [name]   MRN: [id number]    DOB: 11-Feb-1981   Date:12/14/2019       Progress Note  Subjective  Chief Complaint  Chief Complaint  Patient presents with  . Hypertension    HPI  HTN/palpitation: she takes medication at 5 pm , she is here from work and is going back for an extra shift. BP is borderline, she states she has not noticed any palpitation lately. No chest pain. She is going to the gym twice a week and taking boot camp classes. She has good exercise tolerance   Patient Active Problem List   Diagnosis Date Noted  . Mitral valve prolapse 01/11/2019  . Hypertension, benign 09/14/2018  . Chronic left hip pain 12/04/2017  . Smell disturbance 12/04/2017  . Obesity, Class II, BMI 35-39.9 12/04/2017  . Abnormal finding on MRI of brain 11/05/2017  . 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 Patellofemoral stress syndrome of both knees 08/09/2014    Past Surgical History:  Procedure Laterality Date  . CESAREAN SECTION  2002  . CESAREAN SECTION  2005    Family History  Problem Relation Age of Onset  . Emphysema Mother   . Hypercalcemia Mother   . Hypertension Mother   . Sickle cell trait Mother   . Clotting disorder Mother   . Prostate cancer Father   . Diabetes Mellitus II Father   . Hypertension Father   . Heart Problems Father        Enlarged Heart  . Thyroid disease Sister   . Asthma Paternal Grandmother   . Lung cancer Maternal Uncle   . Lung cancer Maternal Aunt   . Breast cancer Maternal Aunt        early 58's  . Kidney disease Paternal Aunt     Social History   Tobacco Use  . Smoking status: Never Smoker  . Smokeless tobacco: Never Used  Substance Use Topics  . Alcohol use: Not Currently    Alcohol/week: 0.0 standard drinks    Comment: occasionally     Current Outpatient Medications:  .  Ascorbic Acid (VITAMIN C) POWD, Take by mouth., Disp: , Rfl:  .  metoprolol succinate (TOPROL-XL) 25 MG 24 hr tablet, Take 1 tablet by mouth once  daily, Disp: 90 tablet, Rfl: 0 .  Multiple Vitamin (MULTIVITAMIN WITH MINERALS) TABS tablet, Take 1 tablet by mouth daily., Disp: , Rfl:  .  Probiotic Product (UP4 PROBIOTICS WOMENS PO), Take by mouth., Disp: , Rfl:   Allergies  Allergen Reactions  . Pantoprazole     Pain in left arm    I personally reviewed active problem list, medication list, allergies, family history, social history, health maintenance with the patient/caregiver today.   ROS  Constitutional: Negative for fever or weight change.  Respiratory: Negative for cough and shortness of breath.   Cardiovascular: Negative for chest pain or palpitations.  Gastrointestinal: Negative for abdominal pain, no bowel changes.  Musculoskeletal: Negative for gait problem or joint swelling.  Skin: Negative for rash.  Neurological: Negative for dizziness or headache.  No other specific complaints in a complete review of systems (except as listed in HPI above).  Objective  Vitals:   12/14/19 1540  BP: 136/86  Pulse: 78  Resp: 16  Temp: (!) 97.5 F (36.4 C)  TempSrc: Temporal  SpO2: 100%  Weight: 171 lb 11.2 oz (77.9 kg)  Height: 5' (1.524 m)    Body mass index is 33.53 kg/m.  Physical Exam  Constitutional: Patient  appears well-developed and well-nourished. Obese  No distress.  HEENT: head atraumatic, normocephalic, pupils equal and reactive to light,  neck supple, throat within normal limits Cardiovascular: Normal rate, regular rhythm and normal heart sounds.  No murmur heard. No BLE edema. Pulmonary/Chest: Effort normal and breath sounds normal. No respiratory distress. Abdominal: Soft.  There is no tenderness. Psychiatric: Patient has a normal mood and affect. behavior is normal. Judgment and thought content normal.  Recent Results (from the past 2160 hour(s))  Lipid panel     Status: None   Collection Time: 11/16/19  4:10 PM  Result Value Ref Range   Cholesterol 155 <200 mg/dL   HDL 63 > OR = 50 mg/dL    Triglycerides 63 <650 mg/dL   LDL Cholesterol (Calc) 78 mg/dL (calc)    Comment: Reference range: <100 . Desirable range <100 mg/dL for primary prevention;   <70 mg/dL for patients with CHD or diabetic patients  with > or = 2 CHD risk factors. Marland Kitchen LDL-C is now calculated using the Martin-Hopkins  calculation, which is a validated novel method providing  better accuracy than the Friedewald equation in the  estimation of LDL-C.  Horald Pollen et al. Lenox Ahr. 3546;568(12): 2061-2068  (http://education.[facility].com/faq/FAQ164)    Total CHOL/HDL Ratio 2.5 <5.0 (calc)   Non-HDL Cholesterol (Calc) 92 <751 mg/dL (calc)    Comment: For patients with diabetes plus 1 major ASCVD risk  factor, treating to a non-HDL-C goal of <100 mg/dL  (LDL-C of <70 mg/dL) is considered a therapeutic  option.   COMPLETE METABOLIC PANEL WITH GFR     Status: None   Collection Time: 11/16/19  4:10 PM  Result Value Ref Range   Glucose, Bld 90 65 - 99 mg/dL    Comment: .            Fasting reference interval .    BUN 9 7 - 25 mg/dL   Creat 0.17 4.94 - 4.96 mg/dL   GFR, Est Non African American 81 > OR = 60 mL/min/1.55m2   GFR, Est African American 94 > OR = 60 mL/min/1.57m2   BUN/Creatinine Ratio NOT APPLICABLE 6 - 22 (calc)   Sodium 140 135 - 146 mmol/L   Potassium 4.2 3.5 - 5.3 mmol/L   Chloride 105 98 - 110 mmol/L   CO2 29 20 - 32 mmol/L   Calcium 9.4 8.6 - 10.2 mg/dL   Total Protein 6.8 6.1 - 8.1 g/dL   Albumin 4.1 3.6 - 5.1 g/dL   Globulin 2.7 1.9 - 3.7 g/dL (calc)   AG Ratio 1.5 1.0 - 2.5 (calc)   Total Bilirubin 0.7 0.2 - 1.2 mg/dL   Alkaline phosphatase (APISO) 35 31 - 125 U/L   AST 17 10 - 30 U/L   ALT 18 6 - 29 U/L  CBC with Differential/Platelet     Status: None   Collection Time: 11/16/19  4:10 PM  Result Value Ref Range   WBC 4.1 3.8 - 10.8 Thousand/uL   RBC 4.34 3.80 - 5.10 Million/uL   Hemoglobin 12.1 11.7 - 15.5 g/dL   HCT 75.9 16.3 - 84.6 %   MCV 87.1 80.0 - 100.0 fL   MCH 27.9  27.0 - 33.0 pg   MCHC 32.0 32.0 - 36.0 g/dL   RDW 65.9 93.5 - 70.1 %   Platelets 214 140 - 400 Thousand/uL   MPV 10.0 7.5 - 12.5 fL   Neutro Abs 1,870 1,500 - 7,800 cells/uL   Lymphs Abs 1,788 850 - 3,900  cells/uL   Absolute Monocytes 385 200 - 950 cells/uL   Eosinophils Absolute 49 15 - 500 cells/uL   Basophils Absolute 8 0 - 200 cells/uL   Neutrophils Relative % 45.6 %   Total Lymphocyte 43.6 %   Monocytes Relative 9.4 %   Eosinophils Relative 1.2 %   Basophils Relative 0.2 %  VITAMIN D 25 Hydroxy (Vit-D Deficiency, Fractures)     Status: None   Collection Time: 11/16/19  4:10 PM  Result Value Ref Range   Vit D, 25-Hydroxy 39 30 - 100 ng/mL    Comment: Vitamin D Status         25-OH Vitamin D: . Deficiency:                    <20 ng/mL Insufficiency:             20 - 29 ng/mL Optimal:                 > or = 30 ng/mL . For 25-OH Vitamin D testing on patients on  D2-supplementation and patients for whom quantitation  of D2 and D3 fractions is required, the [facility](TM) 25-OH VIT D, (D2,D3), LC/MS/MS is recommended: order  code [id number] (patients >80yrs). See Note 1 . Note 1 . For additional information, please refer to  http://education.[facility].com/faq/FAQ199  (This link is being provided for informational/ educational purposes only.)   Hemoglobin A1c     Status: None   Collection Time: 11/16/19  4:10 PM  Result Value Ref Range   Hgb A1c MFr Bld 5.4 <5.7 % of total Hgb    Comment: For the purpose of screening for the presence of diabetes: . <5.7%       Consistent with the absence of diabetes 5.7-6.4%    Consistent with increased risk for diabetes             (prediabetes) > or =6.5%  Consistent with diabetes . This assay result is consistent with a decreased risk of diabetes. . Currently, no consensus exists regarding use of hemoglobin A1c for diagnosis of diabetes in children. . According to American Diabetes Association (ADA) guidelines, hemoglobin  A1c <7.0% represents optimal control in non-pregnant diabetic patients. Different metrics may apply to specific patient populations.  Standards of Medical Care in Diabetes(ADA). .    Mean Plasma Glucose 108 (calc)   eAG (mmol/L) 6.0 (calc)      PHQ2/9: Depression screen [facility] 2/9 12/14/2019 11/16/2019 05/18/2019 02/15/2019 01/19/2019  Decreased Interest 0 0 1 1 0  Down, Depressed, Hopeless 0 0 0 0 0  PHQ - 2 Score 0 0 1 1 0  Altered sleeping 0 0 1 1 0  Tired, decreased energy 0 1 1 0 1  Change in appetite 0 0 0 0 0  Feeling bad or failure about yourself  0 0 0 0 0  Trouble concentrating 0 0 0 0 0  Moving slowly or fidgety/restless 0 0 0 0 0  Suicidal thoughts 0 0 0 0 0  PHQ-9 Score 0 1 3 2 1   Difficult doing work/chores - Not difficult at all Not difficult at all - Somewhat difficult  Some recent data might be hidden    phq 9 is negative   Fall Risk: Fall Risk  12/14/2019 11/16/2019 05/18/2019 02/15/2019 01/19/2019  Falls in the past year? 0 0 0 0 0  Number falls in past yr: 0 0 0 0 0  Injury with Fall? 0 0 0 0 0  Functional Status Survey: Is the patient deaf or have difficulty hearing?: No Does the patient have difficulty seeing, even when wearing glasses/contacts?: No Does the patient have difficulty concentrating, remembering, or making decisions?: No Does the patient have difficulty walking or climbing stairs?: No Does the patient have difficulty dressing or bathing?: No Does the patient have difficulty doing errands alone such as visiting a doctor's office or shopping?: No    Assessment & Plan  1. Hypertension, benign  - metoprolol succinate (TOPROL-XL) 25 MG 24 hr tablet; Take 1 tablet (25 mg total) by mouth daily.  Dispense: 90 tablet; Refill: 1  2. Palpitation  - metoprolol succinate (TOPROL-XL) 25 MG 24 hr tablet; Take 1 tablet (25 mg total) by mouth daily.  Dispense: 90 tablet; Refill: 1

## 2019-12-15 ENCOUNTER — Encounter: Payer: Self-pay | Admitting: Family Medicine

## 2020-02-20 ENCOUNTER — Ambulatory Visit: Payer: BLUE CROSS/BLUE SHIELD | Admitting: Medical

## 2020-02-21 ENCOUNTER — Other Ambulatory Visit: Payer: Self-pay

## 2020-02-21 ENCOUNTER — Ambulatory Visit: Payer: 59 | Admitting: *Deleted

## 2020-02-21 DIAGNOSIS — Z20822 Contact with and (suspected) exposure to covid-19: Secondary | ICD-10-CM

## 2020-02-21 LAB — POC COVID19 BINAXNOW: SARS Coronavirus 2 Ag: NEGATIVE

## 2020-03-07 ENCOUNTER — Other Ambulatory Visit: Payer: Self-pay

## 2020-03-07 ENCOUNTER — Ambulatory Visit (INDEPENDENT_AMBULATORY_CARE_PROVIDER_SITE_OTHER): Payer: 59 | Admitting: Advanced Practice Midwife

## 2020-03-07 ENCOUNTER — Encounter: Payer: Self-pay | Admitting: Advanced Practice Midwife

## 2020-03-07 VITALS — BP 147/97 | Ht 60.0 in | Wt 169.0 lb

## 2020-03-07 DIAGNOSIS — Z3009 Encounter for other general counseling and advice on contraception: Secondary | ICD-10-CM | POA: Diagnosis not present

## 2020-03-07 DIAGNOSIS — Z30011 Encounter for initial prescription of contraceptive pills: Secondary | ICD-10-CM

## 2020-03-07 MED ORDER — LEVONORGESTREL-ETHINYL ESTRAD 0.1-20 MG-MCG PO TABS: 1.0000 | ORAL_TABLET | Freq: Every day | ORAL | 4 refills | Status: DC

## 2020-03-07 NOTE — Progress Notes (Signed)
Patient ID: Bethany [name], female   DOB: 09-26-1980, 39 y.o.   MRN: [id number]  Reason for Consult: Consult (Discuss birthcontrol options, excluding Nuvaring,Depo, Angelene Giovanni.)   Subjective:  HPI:  Bethany [name] is a 39 y.o. female being seen for birth control consult. She has not been sexually active for a number of years due to social circumstances. Her husband has been incarcerated and recently sent back to his home country of [place]. She will be going to see him soon and would like to start on birth control. Her last menstrual period is 03/05/2020. She is most interested in a low dose combined pill. We discussed other options including LARCs which she declines at this time. Her last PAP was about 2 years ago and was normal. She does not have concern for STDs. She is seen by her PCP for management of hypertension and borderline diabetes. She takes anti-hypertensive medication. She does not take medication for diabetes. She has made lifestyle changes to improve diet and physical activity.   Past Medical History:  Diagnosis Date   Diabetes mellitus without complication (HCC)    Hypertension    Family History  Problem Relation Age of Onset   Emphysema Mother    Hypercalcemia Mother    Hypertension Mother    Sickle cell trait Mother    Clotting disorder Mother    Prostate cancer Father    Diabetes Mellitus II Father    Hypertension Father    Heart Problems Father        Enlarged Heart   Thyroid disease Sister    Asthma Paternal Grandmother    Lung cancer Maternal Uncle    Lung cancer Maternal Aunt    Breast cancer Maternal Aunt        early 64's   Kidney disease Paternal Aunt    Past Surgical History:  Procedure Laterality Date   CESAREAN SECTION  2002   CESAREAN SECTION  2005    Short Social History:  Social History   Tobacco Use   Smoking status: Never Smoker   Smokeless tobacco: Never Used  Substance Use Topics   Alcohol use: Not Currently     Alcohol/week: 0.0 standard drinks    Comment: occasionally    Allergies  Allergen Reactions   Pantoprazole     Pain in left arm    Current Outpatient Medications  Medication Sig Dispense Refill   Ascorbic Acid (VITAMIN C) POWD Take by mouth.     metoprolol succinate (TOPROL-XL) 25 MG 24 hr tablet Take 1 tablet (25 mg total) by mouth daily. 90 tablet 1   Multiple Vitamin (MULTIVITAMIN WITH MINERALS) TABS tablet Take 1 tablet by mouth daily.     Probiotic Product (UP4 PROBIOTICS WOMENS PO) Take by mouth.     levonorgestrel-ethinyl estradiol (AVIANE) 0.1-20 MG-MCG tablet Take 1 tablet by mouth daily. 84 tablet 4   No current facility-administered medications for this visit.   Review of Systems  Constitutional: Negative for chills and fever.  HENT: Negative for congestion, ear discharge, ear pain, hearing loss, sinus pain and sore throat.   Eyes: Negative for blurred vision and double vision.  Respiratory: Negative for cough, shortness of breath and wheezing.   Cardiovascular: Negative for chest pain, palpitations and leg swelling.  Gastrointestinal: Negative for abdominal pain, blood in stool, constipation, diarrhea, heartburn, melena, nausea and vomiting.  Genitourinary: Negative for dysuria, flank pain, frequency, hematuria and urgency.  Musculoskeletal: Negative for back pain, joint pain and myalgias.  Skin: Negative for itching  and rash.  Neurological: Negative for dizziness, tingling, tremors, sensory change, speech change, focal weakness, seizures, loss of consciousness, weakness and headaches.  Endo/Heme/Allergies: Negative for environmental allergies. Does not bruise/bleed easily.  Psychiatric/Behavioral: Negative for depression, hallucinations, memory loss, substance abuse and suicidal ideas. The patient is not nervous/anxious and does not have insomnia.        Objective:  Objective   Vitals:   03/07/20 0823  BP: (!) 147/97  Weight: 169 lb (76.7 kg)  Height:  5' (1.524 m)   Body mass index is 33.01 kg/m. Constitutional: Well nourished, well developed female in no acute distress.  HEENT: normal Skin: Warm and dry.  Cardiovascular: Regular rate and rhythm.   Extremity: no edema  Respiratory: Clear to auscultation bilateral. Normal respiratory effort Neuro: DTRs 2+, Cranial nerves grossly intact Psych: Alert and Oriented x3. No memory deficits. Normal mood and affect.  MS: normal gait, normal bilateral lower extremity ROM/strength/stability.    Assessment/Plan:     39 y.o. G6 P2042 female birth control consult/initiation of OCP  Rx low dose estrogen OCP/Aviane Follow up as needed and for annual exam   Tresea Mall CNM [facility] 03/07/2020, 1:54 PM

## 2020-03-07 NOTE — Patient Instructions (Signed)

## 2020-04-09 ENCOUNTER — Other Ambulatory Visit: Payer: Self-pay

## 2020-04-09 ENCOUNTER — Ambulatory Visit: Payer: 59 | Admitting: *Deleted

## 2020-04-09 ENCOUNTER — Telehealth: Payer: 59 | Admitting: Medical

## 2020-04-09 ENCOUNTER — Encounter: Payer: Self-pay | Admitting: Medical

## 2020-04-09 DIAGNOSIS — Z20822 Contact with and (suspected) exposure to covid-19: Secondary | ICD-10-CM

## 2020-04-09 DIAGNOSIS — J302 Other seasonal allergic rhinitis: Secondary | ICD-10-CM

## 2020-04-09 DIAGNOSIS — J301 Allergic rhinitis due to pollen: Secondary | ICD-10-CM

## 2020-04-09 LAB — POC COVID19 BINAXNOW: SARS Coronavirus 2 Ag: NEGATIVE

## 2020-04-09 NOTE — Progress Notes (Signed)
   Subjective:    Patient ID: Bethany [name], female    DOB: 09-11-1980, 39 y.o.   MRN: [id number]  HPI 39 yo female in non acute distress, consents to telemedicine appointment. Symptoms  started yesterday sneezing and runny nose.  Sneezing improved and runny nose is almost gone. She does have a history of seasonal allergies.She took Coricidan HBp and feels much better today.  Per her work she was suggested to be seen by  [facility] and [facility] for evaluation.  No known exposure/ close contact to Covid-19 .  Review of Systems  Constitutional: Positive for malaise/fatigue. Negative for chills and fever.  HENT: Positive for congestion and sore throat (yesterday better today). Negative for ear pain.        Sneezing but less so today  Respiratory: Negative for cough, shortness of breath and wheezing.   Cardiovascular: Negative for chest pain.  Gastrointestinal: Negative for abdominal pain, diarrhea, nausea and vomiting.  Musculoskeletal: Negative for myalgias.  Skin: Negative for rash.  Neurological: Positive for headaches (yesterday). Negative for dizziness.       No syncope  Endo/Heme/Allergies: Positive for environmental allergies.      Unvaccinated. Objective:   Physical Exam AXOX3 No physical was performed due to telemedicine appointment.   Results for orders placed or performed in visit on 04/09/20 (from the past 24 hour(s))  POC COVID-19     Status: Normal   Collection Time: 04/09/20  2:30 PM  Result Value Ref Range   SARS Coronavirus 2 Ag Negative Negative        Assessment & Plan:  Allergic Rhinitis and seasonal allergies. Start taking  OTC Claritin  For allergy symptoms along with OTC Flonase as directed on the package. Uses Coricidan HBp as directed on the package. Due your Hypertension, do not take a decongestant like Sudafed. Patinet may return to work , she states she called out today and it was suggested be seen by [facility].   Her POC Covid-19 test was negative. If any new symptoms present, cough abd. Pain, loss of taste or smell., SOB, fever or chills or any other concerns she is to contact the clinic. Patient verbalizes understanding an has no questions at the end of our conversation.

## 2020-04-11 ENCOUNTER — Other Ambulatory Visit: Payer: Self-pay

## 2020-04-11 ENCOUNTER — Telehealth: Payer: 59 | Admitting: Registered Nurse

## 2020-04-11 ENCOUNTER — Encounter: Payer: Self-pay | Admitting: Registered Nurse

## 2020-04-11 ENCOUNTER — Ambulatory Visit: Payer: 59

## 2020-04-11 VITALS — HR 62 | Resp 16

## 2020-04-11 DIAGNOSIS — Z20822 Contact with and (suspected) exposure to covid-19: Secondary | ICD-10-CM

## 2020-04-11 DIAGNOSIS — J00 Acute nasopharyngitis [common cold]: Secondary | ICD-10-CM

## 2020-04-11 LAB — POC COVID19 BINAXNOW: SARS Coronavirus 2 Ag: NEGATIVE

## 2020-04-11 MED ORDER — FLUTICASONE PROPIONATE 50 MCG/ACT NA SUSP: 1.0000 | Freq: Two times a day (BID) | NASAL | 1 refills | Status: DC | PRN

## 2020-04-11 MED ORDER — SALINE SPRAY 0.65 % NA SOLN: 2.0000 | NASAL | 0 refills | Status: DC

## 2020-04-11 NOTE — Patient Instructions (Signed)
10 Things You Can Do to Manage Your COVID-19 Symptoms at Home If you have possible or confirmed COVID-19: 1. Stay home from work and school. And stay away from other public places. If you must go out, avoid using any kind of public transportation, ridesharing, or taxis. 2. Monitor your symptoms carefully. If your symptoms get worse, call your healthcare provider immediately. 3. Get rest and stay hydrated. 4. If you have a medical appointment, call the healthcare provider ahead of time and tell them that you have or may have COVID-19. 5. For medical emergencies, call 911 and notify the dispatch personnel that you have or may have COVID-19. 6. Cover your cough and sneezes with a tissue or use the inside of your elbow. 7. Wash your hands often with soap and water for at least 20 seconds or clean your hands with an alcohol-based hand sanitizer that contains at least 60% alcohol. 8. As much as possible, stay in a specific room and away from other people in your home. Also, you should use a separate bathroom, if available. If you need to be around other people in or outside of the home, wear a mask. 9. Avoid sharing personal items with other people in your household, like dishes, towels, and bedding. 10. Clean all surfaces that are touched often, like counters, tabletops, and doorknobs. Use household cleaning sprays or wipes according to the label instructions. [email or web address] 02/09/2019 This information is not intended to replace advice given to you by your health care provider. Make sure you discuss any questions you have with your health care provider. Document Revised: 07/14/2019 Document Reviewed: 07/14/2019 Elsevier Patient Education  2020 Elsevier Inc. COVID-19: What Your Test Results Mean If you test positive for COVID-19 Take steps to help prevent the spread of COVID-19 Stay home.  Do not leave your home, except to get medical care. Do not visit public areas. Get rest and stay  hydrated. Take over-the-counter medicines, such as acetaminophen, to help you feel better. Stay in touch with your doctor. Separate yourself from other people.  As much as possible, stay in a specific room and away from other people and pets in your home. If you test negative for COVID-19  You probably were not infected at the time your sample was collected.  However, that does not mean you will not get sick.  It is possible that you were very early in your infection when your sample was collected and that you could test positive later. A negative test result does not mean you won't get sick later. [email or web address] 01/08/2019 This information is not intended to replace advice given to you by your health care provider. Make sure you discuss any questions you have with your health care provider. Document Revised: 07/14/2019 Document Reviewed: 07/14/2019 Elsevier Patient Education  2020 Elsevier Inc. COVID-19 COVID-19 is a respiratory infection that is caused by a virus called severe acute respiratory syndrome coronavirus 2 (SARS-CoV-2). The disease is also known as coronavirus disease or novel coronavirus. In some people, the virus may not cause any symptoms. In others, it may cause a serious infection. The infection can get worse quickly and can lead to complications, such as:  Pneumonia, or infection of the lungs.  Acute respiratory distress syndrome or ARDS. This is a condition in which fluid build-up in the lungs prevents the lungs from filling with air and passing oxygen into the blood.  Acute respiratory failure. This is a condition in which there is not enough oxygen passing from the lungs  to the body or when carbon dioxide is not passing from the lungs out of the body.  Sepsis or septic shock. This is a serious bodily reaction to an infection.  Blood clotting problems.  Secondary infections due to bacteria or fungus.  Organ failure. This is when your body's organs stop  working. The virus that causes COVID-19 is contagious. This means that it can spread from person to person through droplets from coughs and sneezes (respiratory secretions). What are the causes? This illness is caused by a virus. You may catch the virus by:  Breathing in droplets from an infected person. Droplets can be spread by a person breathing, speaking, singing, coughing, or sneezing.  Touching something, like a table or a doorknob, that was exposed to the virus (contaminated) and then touching your mouth, nose, or eyes. What increases the risk? Risk for infection You are more likely to be infected with this virus if you:  Are within 6 feet (2 meters) of a person with COVID-19.  Provide care for or live with a person who is infected with COVID-19.  Spend time in crowded indoor spaces or live in shared housing. Risk for serious illness You are more likely to become seriously ill from the virus if you:  Are 41 years of age or older. The higher your age, the more you are at risk for serious illness.  Live in a nursing home or long-term care facility.  Have cancer.  Have a long-term (chronic) disease such as: ? Chronic lung disease, including chronic obstructive pulmonary disease or asthma. ? A long-term disease that lowers your body's ability to fight infection (immunocompromised). ? Heart disease, including heart failure, a condition in which the arteries that lead to the heart become narrow or blocked (coronary artery disease), a disease which makes the heart muscle thick, weak, or stiff (cardiomyopathy). ? Diabetes. ? Chronic kidney disease. ? Sickle cell disease, a condition in which red blood cells have an abnormal "sickle" shape. ? Liver disease.  Are obese. What are the signs or symptoms? Symptoms of this condition can range from mild to severe. Symptoms may appear any time from 2 to 14 days after being exposed to the virus. They include:  A fever or chills.  A  cough.  Difficulty breathing.  Headaches, body aches, or muscle aches.  Runny or stuffy (congested) nose.  A sore throat.  New loss of taste or smell. Some people may also have stomach problems, such as nausea, vomiting, or diarrhea. Other people may not have any symptoms of COVID-19. How is this diagnosed? This condition may be diagnosed based on:  Your signs and symptoms, especially if: ? You live in an area with a COVID-19 outbreak. ? You recently traveled to or from an area where the virus is common. ? You provide care for or live with a person who was diagnosed with COVID-19. ? You were exposed to a person who was diagnosed with COVID-19.  A physical exam.  Lab tests, which may include: ? Taking a sample of fluid from the back of your nose and throat (nasopharyngeal fluid), your nose, or your throat using a swab. ? A sample of mucus from your lungs (sputum). ? Blood tests.  Imaging tests, which may include, X-rays, CT scan, or ultrasound. How is this treated? At present, there is no medicine to treat COVID-19. Medicines that treat other diseases are being used on a trial basis to see if they are effective against COVID-19. Your health care  provider will talk with you about ways to treat your symptoms. For most people, the infection is mild and can be managed at home with rest, fluids, and over-the-counter medicines. Treatment for a serious infection usually takes places in a hospital intensive care unit (ICU). It may include one or more of the following treatments. These treatments are given until your symptoms improve.  Receiving fluids and medicines through an IV.  Supplemental oxygen. Extra oxygen is given through a tube in the nose, a face mask, or a hood.  Positioning you to lie on your stomach (prone position). This makes it easier for oxygen to get into the lungs.  Continuous positive airway pressure (CPAP) or bi-level positive airway pressure (BPAP) machine. This  treatment uses mild air pressure to keep the airways open. A tube that is connected to a motor delivers oxygen to the body.  Ventilator. This treatment moves air into and out of the lungs by using a tube that is placed in your windpipe.  Tracheostomy. This is a procedure to create a hole in the neck so that a breathing tube can be inserted.  Extracorporeal membrane oxygenation (ECMO). This procedure gives the lungs a chance to recover by taking over the functions of the heart and lungs. It supplies oxygen to the body and removes carbon dioxide. Follow these instructions at home: Lifestyle  If you are sick, stay home except to get medical care. Your health care provider will tell you how long to stay home. Call your health care provider before you go for medical care.  Rest at home as told by your health care provider.  Do not use any products that contain nicotine or tobacco, such as cigarettes, e-cigarettes, and chewing tobacco. If you need help quitting, ask your health care provider.  Return to your normal activities as told by your health care provider. Ask your health care provider what activities are safe for you. General instructions  Take over-the-counter and prescription medicines only as told by your health care provider.  Drink enough fluid to keep your urine pale yellow.  Keep all follow-up visits as told by your health care provider. This is important. How is this prevented?  There is no vaccine to help prevent COVID-19 infection. However, there are steps you can take to protect yourself and others from this virus. To protect yourself:   Do not travel to areas where COVID-19 is a risk. The areas where COVID-19 is reported change often. To identify high-risk areas and travel restrictions, check the CDC travel website: [employer]  If you live in, or must travel to, an area where COVID-19 is a risk, take precautions to avoid infection. ? Stay away from  people who are sick. ? Wash your hands often with soap and water for 20 seconds. If soap and water are not available, use an alcohol-based hand sanitizer. ? Avoid touching your mouth, face, eyes, or nose. ? Avoid going out in public, follow guidance from your state and local health authorities. ? If you must go out in public, wear a cloth face covering or face mask. Make sure your mask covers your nose and mouth. ? Avoid crowded indoor spaces. Stay at least 6 feet (2 meters) away from others. ? Disinfect objects and surfaces that are frequently touched every day. This may include:  Counters and tables.  Doorknobs and light switches.  Sinks and faucets.  Electronics, such as phones, remote controls, keyboards, computers, and tablets. To protect others: If you have symptoms of  COVID-19, take steps to prevent the virus from spreading to others.  If you think you have a COVID-19 infection, contact your health care provider right away. Tell your health care team that you think you may have a COVID-19 infection.  Stay home. Leave your house only to seek medical care. Do not use public transport.  Do not travel while you are sick.  Wash your hands often with soap and water for 20 seconds. If soap and water are not available, use alcohol-based hand sanitizer.  Stay away from other members of your household. Let healthy household members care for children and pets, if possible. If you have to care for children or pets, wash your hands often and wear a mask. If possible, stay in your own room, separate from others. Use a different bathroom.  Make sure that all people in your household wash their hands well and often.  Cough or sneeze into a tissue or your sleeve or elbow. Do not cough or sneeze into your hand or into the air.  Wear a cloth face covering or face mask. Make sure your mask covers your nose and mouth. Where to find more information  Centers for Disease Control and Prevention:  [employer]Em[employer]ium.tn  World Health Organization: https://thompson-craig.com/ Contact a health care provider if:  You live in or have traveled to an area where COVID-19 is a risk and you have symptoms of the infection.  You have had contact with someone who has COVID-19 and you have symptoms of the infection. Get help right away if:  You have trouble breathing.  You have pain or pressure in your chest.  You have confusion.  You have bluish lips and fingernails.  You have difficulty waking from sleep.  You have symptoms that get worse. These symptoms may represent a serious problem that is an emergency. Do not wait to see if the symptoms will go away. Get medical help right away. Call your local emergency services (911 in the [place] Do not drive yourself to the hospital. Let the emergency medical personnel know if you think you have COVID-19. Summary  COVID-19 is a respiratory infection that is caused by a virus. It is also known as coronavirus disease or novel coronavirus. It can cause serious infections, such as pneumonia, acute respiratory distress syndrome, acute respiratory failure, or sepsis.  The virus that causes COVID-19 is contagious. This means that it can spread from person to person through droplets from breathing, speaking, singing, coughing, or sneezing.  You are more likely to develop a serious illness if you are 11 years of age or older, have a weak immune system, live in a nursing home, or have chronic disease.  There is no medicine to treat COVID-19. Your health care provider will talk with you about ways to treat your symptoms.  Take steps to protect yourself and others from infection. Wash your hands often and disinfect objects and surfaces that are frequently touched every day. Stay away from people who are sick and wear a mask if you are sick. This information is not intended to replace advice given to you by your health care  provider. Make sure you discuss any questions you have with your health care provider. Document Revised: 05/27/2019 Document Reviewed: 09/02/2018 Elsevier Patient Education  2020 Elsevier Inc. Cough, Adult Coughing is a reflex that clears your throat and your airways (respiratory system). Coughing helps to heal and protect your lungs. It is normal to cough occasionally, but a cough that happens with other  symptoms or lasts a long time may be a sign of a condition that needs treatment. An acute cough may only last 2-3 weeks, while a chronic cough may last 8 or more weeks. Coughing is commonly caused by:  Infection of the respiratory systemby viruses or bacteria.  Breathing in substances that irritate your lungs.  Allergies.  Asthma.  Mucus that runs down the back of your throat (postnasal drip).  Smoking.  Acid backing up from the stomach into the esophagus (gastroesophageal reflux).  Certain medicines.  Chronic lung problems.  Other medical conditions such as heart failure or a blood clot in the lung (pulmonary embolism). Follow these instructions at home: Medicines  Take over-the-counter and prescription medicines only as told by your health care provider.  Talk with your health care provider before you take a cough suppressant medicine. Lifestyle   Avoid cigarette smoke. Do not use any products that contain nicotine or tobacco, such as cigarettes, e-cigarettes, and chewing tobacco. If you need help quitting, ask your health care provider.  Drink enough fluid to keep your urine pale yellow.  Avoid caffeine.  Do not drink alcohol if your health care provider tells you not to drink. General instructions   Pay close attention to changes in your cough. Tell your health care provider about them.  Always cover your mouth when you cough.  Avoid things that make you cough, such as perfume, candles, cleaning products, or campfire or tobacco smoke.  If the air is dry, use a  cool mist vaporizer or humidifier in your bedroom or your home to help loosen secretions.  If your cough is worse at night, try to sleep in a semi-upright position.  Rest as needed.  Keep all follow-up visits as told by your health care provider. This is important. Contact a health care provider if you:  Have new symptoms.  Cough up pus.  Have a cough that does not get better after 2-3 weeks or gets worse.  Cannot control your cough with cough suppressant medicines and you are losing sleep.  Have pain that gets worse or pain that is not helped with medicine.  Have a fever.  Have unexplained weight loss.  Have night sweats. Get help right away if:  You cough up blood.  You have difficulty breathing.  Your heartbeat is very fast. These symptoms may represent a serious problem that is an emergency. Do not wait to see if the symptoms will go away. Get medical help right away. Call your local emergency services (911 in the [place] Do not drive yourself to the hospital. Summary  Coughing is a reflex that clears your throat and your airways. It is normal to cough occasionally, but a cough that happens with other symptoms or lasts a long time may be a sign of a condition that needs treatment.  Take over-the-counter and prescription medicines only as told by your health care provider.  Always cover your mouth when you cough.  Contact a health care provider if you have new symptoms or a cough that does not get better after 2-3 weeks or gets worse. This information is not intended to replace advice given to you by your health care provider. Make sure you discuss any questions you have with your health care provider. Document Revised: 08/16/2018 Document Reviewed: 08/16/2018 Elsevier Patient Education  2020 Elsevier Inc. Nonallergic Rhinitis Nonallergic rhinitis is a condition that causes symptoms that affect the nose, such as a runny nose and a stuffed-up nose (nasal congestion)  that can make it hard to breathe through the nose. This condition is different from having an allergy (allergic rhinitis). Allergic rhinitis occurs when the body's defense system (immune system) reacts to a substance that you are allergic to (allergen), such as pollen, pet dander, mold, or dust. Nonallergic rhinitis has many similar symptoms, but it is not caused by allergens. Nonallergic rhinitis can be a short-term or long-term problem. What are the causes? This condition can be caused by many different things. Some common types of nonallergic rhinitis include: Infectious rhinitis  This is usually due to an infection in the upper respiratory tract. Vasomotor rhinitis  This is the most common type of long-term nonallergic rhinitis.  It is caused by too much blood flow through the nose, which makes the tissue inside of the nose swell.  Symptoms are often triggered by strong odors, cold air, stress, drinking alcohol, cigarette smoke, or changes in the weather. Occupational rhinitis  This type is caused by triggers in the workplace, such as chemicals, dusts, animal dander, or air pollution. Hormonal rhinitis  This type occurs in women as a result of an increase in the female hormone estrogen.  It may occur during pregnancy, puberty, and menstrual cycles.  Symptoms improve when estrogen levels drop. Drug-induced rhinitis Several drugs can cause nonallergic rhinitis, including:  Medicines that are used to treat high blood pressure, heart disease, and Parkinson disease.  Aspirin and NSAIDs.  Over-the-counter nasal decongestant sprays. These can cause a type of nonallergic rhinitis (rhinitis medicamentosa) when they are used for more than a few days. Nonallergic rhinitis with eosinophilia syndrome (NARES)  This type is caused by having too much of a certain type of white blood cell (eosinophil). Nonallergic rhinitis can also be caused by a reaction to eating hot or spicy foods. This does  not usually cause long-term symptoms. In some cases, the cause of nonallergic rhinitis is not known. What increases the risk? You are more likely to develop this condition if:  You are 55-45 years of age.  You are a woman. Women are twice as likely to have this condition. What are the signs or symptoms? Common symptoms of this condition include:  Nasal congestion.  Runny nose.  The feeling of mucus going down the back of the throat (postnasal drip).  Trouble sleeping at night and daytime sleepiness. Less common symptoms include:  Sneezing.  Coughing.  Itchy nose.  Bloodshot eyes. How is this diagnosed? This condition may be diagnosed based on:  Your symptoms and medical history.  A physical exam.  Allergy testing to rule out allergic rhinitis. You may have skin tests or blood tests. In some cases, the health care provider may take a swab of nasal secretions to look for an increased number of eosinophils. This would be done to confirm a diagnosis of NARES. How is this treated? Treatment for this condition depends on the cause. No single treatment works for everyone. Work with your health care provider to find the best treatment for you. Treatment may include:  Avoiding the things that trigger your symptoms.  Using medicines to relieve congestion, such as: ? Steroid nasal spray. There are many types. You may need to try a few to find out which one works best. ? Decongestant medicine. This may be an oral medicine or a nasal spray. These medicines are only used for a short time.  Using medicines to relieve a runny nose. These may include antihistamine medicines or anticholinergic nasal sprays.  Surgery to remove tissue  from inside the nose may be needed in severe cases if the condition has not improved after 6-12 months of medical treatment. Follow these instructions at home:  Take or use over-the-counter and prescription medicines only as told by your health care  provider. Do not stop using your medicine even if you start to feel better.  Use salt-water (saline) rinses or other solutions (nasal washes or irrigations) to wash or rinse out the inside of your nose as told by your health care provider.  Do not take NSAIDs or medicines that contain aspirin if they make your symptoms worse.  Do not drink alcohol if it makes your symptoms worse.  Do not use any tobacco products, such as cigarettes, chewing tobacco, and e-cigarettes. If you need help quitting, ask your health care provider.  Avoid secondhand smoke.  Get some exercise every day. Exercise may help reduce symptoms of nonallergic rhinitis for some people. Ask your health care provider how much exercise and what types of exercise are safe for you.  Sleep with the head of your bed raised (elevated). This may reduce nighttime nasal congestion.  Keep all follow-up visits as told by your health care provider. This is important. Contact a health care provider if:  You have a fever.  Your symptoms are getting worse at home.  Your symptoms are not responding to medicine.  You develop new symptoms, especially a headache or nosebleed. This information is not intended to replace advice given to you by your health care provider. Make sure you discuss any questions you have with your health care provider. Document Revised: 07/10/2017 Document Reviewed: 10/18/2015 Elsevier Patient Education  2020 [employer]. How to Perform a Sinus Rinse A sinus rinse is a home treatment that is used to rinse your sinuses with a sterile mixture of salt and water (saline solution). Sinuses are air-filled spaces in your skull behind the bones of your face and forehead that open into your nasal cavity. A sinus rinse can help to clear mucus, dirt, dust, or pollen from your nasal cavity. You may do a sinus rinse when you have a cold, a virus, nasal allergy symptoms, a sinus infection, or stuffiness in your nose or  sinuses. Talk with your health care provider about whether a sinus rinse might help you. What are the risks? A sinus rinse is generally safe and effective. However, there are a few risks, which include:  A burning sensation in your sinuses. This may happen if you do not make the saline solution as directed. Be sure to follow all directions when making the saline solution.  Nasal irritation.  Infection from contaminated water. This is rare, but possible. Do not do a sinus rinse if you have had ear or nasal surgery, ear infection, or blocked ears. Supplies needed:  Saline solution or powder.  Distilled or sterile water may be needed to mix with saline powder. ? You may use boiled and cooled tap water. Boil tap water for 5 minutes; cool until it is lukewarm. Use within 24 hours. ? Do not use regular tap water to mix with the saline solution.  Neti pot or nasal rinse bottle. These supplies release the saline solution into your nose and through your sinuses. Neti pots and nasal rinse bottles can be purchased at [employer], a health food store, or online. How to perform a sinus rinse  1. Wash your hands with soap and water. 2. Wash your device according to the directions that came with the product  and then dry it. 3. Use the solution that comes with your product or one that is sold separately in stores. Follow the mixing directions on the package if you need to mix with sterile or distilled water. 4. Fill the device with the amount of saline solution noted in the device instructions. 5. Stand over a sink and tilt your head sideways over the sink. 6. Place the spout of the device in your upper nostril (the one closer to the ceiling). 7. Gently pour or squeeze the saline solution into your nasal cavity. The liquid should drain out from the lower nostril if you are not too congested. 8. While rinsing, breathe through your open mouth. 9. Gently blow your nose to clear any mucus and rinse  solution. Blowing too hard may cause ear pain. 10. Repeat in your other nostril. 11. Clean and rinse your device with clean water and then air-dry it. Talk with your health care provider or pharmacist if you have questions about how to do a sinus rinse. Summary  A sinus rinse is a home treatment that is used to rinse your sinuses with a sterile mixture of salt and water (saline solution).  A sinus rinse is generally safe and effective. Follow all instructions carefully.  Before doing a sinus rinse, talk with your health care provider about whether it would be helpful for you. This information is not intended to replace advice given to you by your health care provider. Make sure you discuss any questions you have with your health care provider. Document Revised: 05/25/2017 Document Reviewed: 05/25/2017 Elsevier Patient Education  2020 [employer]. Allergic Rhinitis, Adult Allergic rhinitis is an allergic reaction that affects the mucous membrane inside the nose. It causes sneezing, a runny or stuffy nose, and the feeling of mucus going down the back of the throat (postnasal drip). Allergic rhinitis can be mild to severe. There are two types of allergic rhinitis:  Seasonal. This type is also called hay fever. It happens only during certain seasons.  Perennial. This type can happen at any time of the year. What are the causes? This condition happens when the body's defense system (immune system) responds to certain harmless substances called allergens as though they were germs.  Seasonal allergic rhinitis is triggered by pollen, which can come from grasses, trees, and weeds. Perennial allergic rhinitis may be caused by:  House dust mites.  Pet dander.  Mold spores. What are the signs or symptoms? Symptoms of this condition include:  Sneezing.  Runny or stuffy nose (nasal congestion).  Postnasal drip.  Itchy nose.  Tearing of the eyes.  Trouble sleeping.  Daytime  sleepiness. How is this diagnosed? This condition may be diagnosed based on:  Your medical history.  A physical exam.  Tests to check for related conditions, such as: ? Asthma. ? Pink eye. ? Ear infection. ? Upper respiratory infection.  Tests to find out which allergens trigger your symptoms. These may include skin or blood tests. How is this treated? There is no cure for this condition, but treatment can help control symptoms. Treatment may include:  Taking medicines that block allergy symptoms, such as antihistamines. Medicine may be given as a shot, nasal spray, or pill.  Avoiding the allergen.  Desensitization. This treatment involves getting ongoing shots until your body becomes less sensitive to the allergen. This treatment may be done if other treatments do not help.  If taking medicine and avoiding the allergen does not work, new, stronger medicines may be  prescribed. Follow these instructions at home:  Find out what you are allergic to. Common allergens include smoke, dust, and pollen.  Avoid the things you are allergic to. These are some things you can do to help avoid allergens: ? Replace carpet with wood, tile, or vinyl flooring. Carpet can trap dander and dust. ? Do not smoke. Do not allow smoking in your home. ? Change your heating and air conditioning filter at least once a month. ? During allergy season:  Keep windows closed as much as possible.  Plan outdoor activities when pollen counts are lowest. This is usually during the evening hours.  When coming indoors, change clothing and shower before sitting on furniture or bedding.  Take over-the-counter and prescription medicines only as told by your health care provider.  Keep all follow-up visits as told by your health care provider. This is important. Contact a health care provider if:  You have a fever.  You develop a persistent cough.  You make whistling sounds when you breathe (you  wheeze).  Your symptoms interfere with your normal daily activities. Get help right away if:  You have shortness of breath. Summary  This condition can be managed by taking medicines as directed and avoiding allergens.  Contact your health care provider if you develop a persistent cough or fever.  During allergy season, keep windows closed as much as possible. This information is not intended to replace advice given to you by your health care provider. Make sure you discuss any questions you have with your health care provider. Document Revised: 07/10/2017 Document Reviewed: 09/04/2016 Elsevier Patient Education  2020 [employer].

## 2020-04-11 NOTE — Progress Notes (Signed)
Subjective:    Patient ID: Bethany [name], female    DOB: November 19, 1980, 39 y.o.   MRN: [id number]  38y/o african [place] established female with acute rhinitis started Saturday 07 Apr 2020 with sneezing  Cleaning over weekend deep house thought it just flared her allergies  Ragweed flares typically in the fall had taken OTC antihistamine, flonase and nasal saline in the past.  Last year stopped all allergy medication due to acid reflux another doctor told her it would worsen her heartburn symptoms.  Saw PA Ratcliffe on 30 Aug and negative Binaxnow rapid test.  She couldn't find her flonase or nasal saline.  Has been taking coricidin HBP and honey cough drops.  Wanted to know her PCR results today.  None in clinic log or Epic.  She reported two swabs done on Monday.  Nasal discharge clear.  Sore throat (resolved after 1 day), fatigue, sleeping more often and cough. Hasn't been to work this week. Patient denied body aches, fever, chills, n/v/d, wheezing, blue lips/face, trouble breathing or chest pain.  Woke up during night on Tuesday feeling like she was suffocated and resolved on own denied further dyspnea.  Tried to go back to work but Merchandiser, retail requested she have another appt.  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 Patient lives with 83 year old son and she and he have been wearing mask at home. Patient consented to telephone visit.  This visit was conducted entirely via telephone/audio.  I spent 14 minutes on the telephone with patient.       Review of Systems  Constitutional: Positive for activity change and fatigue. Negative for appetite change, chills, diaphoresis and fever.  HENT: Positive for postnasal drip, rhinorrhea, sneezing and sore throat. Negative for facial swelling, nosebleeds, sinus pressure, sinus pain, trouble swallowing and voice change.   Eyes: Negative for photophobia, discharge and visual  disturbance.  Respiratory: Positive for cough and shortness of breath. Negative for choking, chest tightness, wheezing and stridor.   Cardiovascular: Negative for chest pain.  Gastrointestinal: Negative for diarrhea, nausea and vomiting.  Endocrine: Negative for cold intolerance and heat intolerance.  Genitourinary: Negative for difficulty urinating.  Musculoskeletal: Negative for gait problem, myalgias, neck pain and neck stiffness.  Skin: Negative for rash.  Allergic/Immunologic: Positive for environmental allergies. Negative for food allergies.  Neurological: Positive for headaches. Negative for dizziness, tremors, seizures, syncope, facial asymmetry, speech difficulty, weakness, light-headedness and numbness.  Hematological: Negative for adenopathy. Does not bruise/bleed easily.  Psychiatric/Behavioral: Negative for agitation, confusion and sleep disturbance.       Objective:   Physical Exam Constitutional:      General: She is awake. She is not in acute distress. HENT:     Nose: No congestion.  Pulmonary:     Effort: Pulmonary effort is normal. No respiratory distress.     Breath sounds: Normal breath sounds and air entry. No stridor or transmitted upper airway sounds. No wheezing.     Comments: No cough heard during telephone conversation; spoke full sentences without difficulty Neurological:     Mental Status: She is alert and oriented to person, place, and time.  Psychiatric:        Attention and Perception: Attention and perception normal.        Mood and Affect: Mood normal.        Speech: Speech normal.        Behavior: Behavior normal. Behavior is  cooperative.        Thought Content: Thought content normal.        Cognition and Memory: Cognition and memory normal.        Judgment: Judgment normal.           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   Symptoms started Saturday 28 Aug rhinitis typically fall allergies with goldenrod but had stopped all allergy medication over a year ago due to heartburn.  Discussed with patient covid, RSV, other colds, fall allergies common at this time of year but typically allergies do not cause fatigue/sleeping more for her in the past.  Patient didn't have health benefits until today so had not picked up flonase/nasal saline yet.  She did start coricidin HBP and cough drops.  Patient notified will repeat rapid test and if negative send PCR today due to first test performed on symptom day 2.  Today is symptom day 4.  Continue quarantine at home and monitoring for symptoms of covid e.g. runny nose, sore throat, headache, loss of taste/smell, nausea/vomiting/diarrhea, fever/chills, body aches, cough, shortness of breath/dyspnea. Patient to notify clinic staff if new symptoms develop.  Discussed clinic open M-F.  Discussed if test positive will be for 10 days starting 08 Apr 2020.  Quarantine to end  04/17/2020 and return to work 04/18/20 as long as improving symptoms, fever/vomiting/diarrhea free 24 hours prior to RTW. Discussed with patient covid spread and incidence in community high.  Continue to protect self with mask wear, hand sanitizing washing, avoid touching face and maintaining social distancing 6 feet or greater. Discussed if covid test positive she would be eligible for monoclonal antibody infusion due to her health risks and eligibility only for first 10 days of symptoms.  Patient reported she would like to think about infusion and is not ready at this time to make a decision.  Electronic Rx sent to her pharmacy of choice fluticasone 1 spray each nostril BID  16g #1 RF1and nasal saline 2 sprays each nostril q2h wa prn congestion #1 RF1 as she has FSA acct.  Discussed use saline first and sniff up saline until she feels running down back of throat then wait 5-10 minutes to allow saline to decrease tissue  swelling and loosen up dried mucous before blowing nose.  Then inhale lightly 1 spray flonase shouldn't feel it hit back of throat otherwise inhaled too forcefully and try again lighter inhaler (no sniff should be heard/audible)  Medicine works best when in nose/sinuses.  Patient may use normal saline nasal spray 2 sprays each nostril q2h wa as needed. flonase 1 spray each nostril BID #1 RF0.  Patient denied personal or family history of ENT cancer.  OTC antihistamine of choice claritin/zyrtec 10mg  po daily.  Avoid triggers if possible.  Shower prior to bedtime and if exposed to triggers.  If allergic dust/dust mites recommend mattress/pillow covers/encasements; washing linens, vacuuming, sweeping, dusting weekly.  Call or return to clinic as needed if these symptoms worsen or fail to improve as anticipated.   Exitcare handouts on cough,allergic rhinitis, nonallergic rhinitis, covid 19, covid 19 manage symptoms and what your test results mean and sinus rinse printed and given to patient by RN when she presented to parking lot for repeat covid testing at 1500.  Patient verbalized understanding of instructions, agreed with plan of care and had no further questions at  this time.  P2:  Avoidance and hand washing.    Patient completed rapid POCT BINAXNOW test today at 1500 with RN Lynelle Doctor.  Rapid covid POCT results negative today and patient given oral and written results by RN Eddie Candle and myself.  Patient notified PCR test sent to [facility] today.  Patient can be reached at [phone] when PCR test results available.  Discussed with patient typically 2-3 days until results available.  Patient may take tylenol 1000mg  po q6h prn pain/fever.  Continue cough drops per manufacturer's instructions orally.  Start flonase and nasal saline and may continue coricidin HBP if helping with her symptoms.  Rest/hydrate and continue quarantine and sanitizing hands frequently along with high touch  surfaces in her home.  Patient verbalized understanding information/instructions, agreed with plan of care and had no further questions at this time.

## 2020-04-12 LAB — NOVEL CORONAVIRUS, NAA: SARS-CoV-2, NAA: NOT DETECTED

## 2020-04-13 ENCOUNTER — Other Ambulatory Visit: Payer: Self-pay

## 2020-04-13 ENCOUNTER — Other Ambulatory Visit (HOSPITAL_COMMUNITY)
Admission: RE | Admit: 2020-04-13 | Discharge: 2020-04-13 | Disposition: A | Discharge status: Home / Self Care | Payer: BC Managed Care – PPO | Source: Ambulatory Visit | Attending: Obstetrics and Gynecology | Admitting: Obstetrics and Gynecology

## 2020-04-13 ENCOUNTER — Ambulatory Visit (INDEPENDENT_AMBULATORY_CARE_PROVIDER_SITE_OTHER): Payer: BC Managed Care – PPO | Admitting: Obstetrics and Gynecology

## 2020-04-13 ENCOUNTER — Telehealth: Payer: Self-pay | Admitting: Registered Nurse

## 2020-04-13 ENCOUNTER — Encounter: Payer: Self-pay | Admitting: Obstetrics and Gynecology

## 2020-04-13 ENCOUNTER — Encounter: Payer: Self-pay | Admitting: Registered Nurse

## 2020-04-13 VITALS — BP 116/80 | Ht 60.0 in | Wt 164.0 lb

## 2020-04-13 DIAGNOSIS — Z01419 Encounter for gynecological examination (general) (routine) without abnormal findings: Secondary | ICD-10-CM

## 2020-04-13 DIAGNOSIS — Z1231 Encounter for screening mammogram for malignant neoplasm of breast: Secondary | ICD-10-CM

## 2020-04-13 NOTE — Progress Notes (Signed)
Gynecology Annual Exam  PCP: Alba Cory, MD  Chief Complaint:  Chief Complaint  Patient presents with  . Gynecologic Exam    no concerns    History of Present Illness: Patient is a 39 y.o. Bethany [name] presents for annual exam. The patient has no complaints today.   LMP: Patient's last menstrual period was 04/05/2020 (exact date). Average Interval: regular,  Heavy Menses: no Clots: no Intermenstrual Bleeding: no Postcoital Bleeding: no Dysmenorrhea: no   The patient is not currently sexually active. She currently uses OCP (estrogen/progesterone) for contraception. She denies dyspareunia.  The patient does perform self breast exams.  There is notable family history of breast or ovarian cancer in her family.   The patient has regular exercise: yes.    The patient denies current symptoms of depression.    Review of Systems: ROS  Past Medical History:  Patient Active Problem List   Diagnosis Date Noted  . Mitral valve prolapse 01/11/2019    Moderate- [facility] Dr. Madaline Savage, MD Echocardiographic Findings.    . Hypertension, benign 09/14/2018  . Chronic left hip pain 12/04/2017  . Smell disturbance 12/04/2017  . Obesity, Class II, BMI 35-39.9 12/04/2017  . 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 Sherryll Burger and given reassurance   . 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 Patellofemoral stress syndrome of both knees 08/09/2014    Past Surgical History:  Past Surgical History:  Procedure Laterality Date  . CESAREAN SECTION  2002  . CESAREAN SECTION  2005    Gynecologic History:  Patient's last menstrual period was 04/05/2020 (exact date). Contraception: taking OCP when goes to see her husband in [place] Last Pap: Results were: NIL 2019  Last mammogram: 2019 - BIRADS 2  Obstetric History: [id number]  Family History:  Family History  Problem Relation Age of  Onset  . Emphysema Mother   . Hypercalcemia Mother   . Hypertension Mother   . Sickle cell trait Mother   . Clotting disorder Mother   . Prostate cancer Father   . Diabetes Mellitus II Father   . Hypertension Father   . Heart Problems Father        Enlarged Heart  . Thyroid disease Sister   . Asthma Paternal Grandmother   . Lung cancer Maternal Uncle   . Lung cancer Maternal Aunt   . Breast cancer Maternal Aunt        early 70's  . Kidney disease Paternal Aunt     Social History:  Social History   Socioeconomic History  . Marital status: Married    Spouse name: Bethany Bethany [name]   . Number of children: 2  . Years of education: Not on file  . Highest education level: Some college, no degree  Occupational History  . Occupation: food service    Comment: at [employer]  . Smoking status: Never Smoker  . Smokeless tobacco: Never Used  Vaping Use  . Vaping Use: Never used  Substance and Sexual Activity  . Alcohol use: Not Currently    Alcohol/week: 0.0 standard drinks    Comment: occasionally  . Drug use: No  . Sexual activity: Yes    Partners: Male    Birth control/protection: Pill  Other Topics Concern  . Not on file  Social History Narrative   Separated , has two children at home, her relatives live [place], she will move  back to [place] in May 2021    Works at [employer]    She moved to [place] for a safer place to raise her children    Her son is homosexual and has been bullied    Daughter has depression    Social Determinants of Corporate investment banker Strain: Low Risk   . Difficulty of Paying Living Expenses: Not hard at all  Food Insecurity: No Food Insecurity  . Worried About Programme researcher, broadcasting/film/video in the Last Year: Never true  . Ran Out of Food in the Last Year: Never true  Transportation Needs: No Transportation Needs  . Lack of Transportation (Medical): No  . Lack of Transportation (Non-Medical): No  Physical Activity: Sufficiently  Active  . Days of Exercise per Week: 5 days  . Minutes of Exercise per Session: 60 min  Stress: No Stress Concern Present  . Feeling of Stress : Only a little  Social Connections: Moderately Isolated  . Frequency of Communication with Friends and Family: More than three times a week  . Frequency of Social Gatherings with Friends and Family: Once a week  . Attends Religious Services: 1 to 4 times per year  . Active Member of Clubs or Organizations: No  . Attends Banker Meetings: Never  . Marital Status: Divorced  Catering manager Violence: Not At Risk  . Fear of Current or Ex-Partner: No  . Emotionally Abused: No  . Physically Abused: No  . Sexually Abused: No    Allergies:  Allergies  Allergen Reactions  . Pantoprazole     Pain in lef,t arm numbness and trouble moving arm     Medications: Prior to Admission medications   Medication Sig Start Date End Date Taking? Authorizing Provider  Ascorbic Acid (VITAMIN C) POWD Take by mouth.   Yes [provider]  fluticasone (FLONASE) 50 MCG/ACT nasal spray Place 1 spray into both nostrils 2 (two) times daily as needed for allergies or rhinitis. 04/11/20 05/11/20 Yes Betancourt, Jarold Song, NP  levonorgestrel-ethinyl estradiol (AVIANE) 0.1-20 MG-MCG tablet Take 1 tablet by mouth daily. 03/07/20  Yes Tresea Mall, CNM  metoprolol succinate (TOPROL-XL) Bethany MG 24 hr tablet Take 1 tablet (Bethany mg total) by mouth daily. 12/14/19  Yes Sowles, Danna Hefty, MD  Multiple Vitamin (MULTIVITAMIN WITH MINERALS) TABS tablet Take 1 tablet by mouth daily.   Yes [provider]  Probiotic Product (UP4 PROBIOTICS WOMENS PO) Take by mouth.   Yes [provider]  sodium chloride (OCEAN) 0.65 % SOLN nasal spray Place 2 sprays into both nostrils every 2 (two) hours while awake. 04/11/20 05/11/20 Yes Betancourt, Jarold Song, NP    Physical Exam Vitals: Blood pressure 116/80, height 5' (1.524 m), weight 164 lb (74.4 kg), last menstrual period  04/05/2020.  General: NAD HEENT: normocephalic, anicteric Thyroid: no enlargement, no palpable nodules Pulmonary: No increased work of breathing, CTAB Cardiovascular: RRR, distal pulses 2+ Breast: Breast symmetrical, no tenderness, no palpable nodules or masses, no skin or nipple retraction present, no nipple discharge.  No axillary or supraclavicular lymphadenopathy. Abdomen: NABS, soft, non-tender, non-distended.  Umbilicus without lesions.  No hepatomegaly, splenomegaly or masses palpable. No evidence of hernia  Genitourinary:  External: Normal external female genitalia.  Normal urethral meatus, normal Bartholin's and Skene's glands.    Vagina: Normal vaginal mucosa, no evidence of prolapse.    Cervix: Grossly normal in appearance, no bleeding  Uterus: Non-enlarged, mobile, normal contour.  No CMT  Adnexa: ovaries non-enlarged, no adnexal  masses  Rectal: deferred  Lymphatic: no evidence of inguinal lymphadenopathy Extremities: no edema, erythema, or tenderness Neurologic: Grossly intact Psychiatric: mood appropriate, affect full  Female chaperone present for pelvic and breast  portions of the physical exam    Assessment: 39 y.o. [id number] routine annual exam  Plan: Problem List Items Addressed This Visit    None    Visit Diagnoses    Encounter for annual routine gynecological examination    -  Primary   Breast cancer screening by mammogram       Relevant Orders   MM 3D SCREEN BREAST BILATERAL   Encounter for gynecological examination without abnormal finding       Encounter for cervical Pap smear with pelvic exam       Relevant Orders   Cytology, thin prep pap (cervical)      1) Mammogram - recommend yearly screening mammogram.  Mammogram Was ordered today  2) STI screening  was offered and accepted  3) ASCCP guidelines and rational discussed.  Patient opts for yearly screening interval  4) Contraception - the patient is currently using  OCP (estrogen/progesterone).   She is happy with her current form of contraception and plans to continue  5) Colonoscopy -- Screening recommended starting at age 33 for average risk individuals, age 79 for individuals deemed at increased risk (including African Americans) and recommended to continue until age 40.  For patient age 60-85 individualized approach is recommended.  Gold standard screening is via colonoscopy, Cologuard screening is an acceptable alternative for patient unwilling or unable to undergo colonoscopy.  "Colorectal cancer screening for average?risk adults: 2018 guideline update from the American Cancer Society"CA: A Cancer Journal for Clinicians: Jan 07, 2017   6) Routine healthcare maintenance including cholesterol, diabetes screening discussed managed by PCP  7) Return in about 1 year (around 04/13/2021) for annual.   Natale Milch [facility], [facility] 04/13/2020, 4:38 PM

## 2020-04-13 NOTE — Patient Instructions (Signed)
Institute of Medicine Recommended Dietary Allowances for Calcium and Vitamin D  Age (yr) Calcium Recommended Dietary Allowance (mg/day) Vitamin D Recommended Dietary Allowance (international units/day)  9-18 1,300 600  19-50 1,000 600  51-70 1,200 600  71 and older 1,200 800  Data from Institute of Medicine. Dietary reference intakes: calcium, vitamin D. [place], [place]: National Academies Press; 2011.    Exercising to Stay Healthy To become healthy and stay healthy, it is recommended that you do moderate-intensity and vigorous-intensity exercise. You can tell that you are exercising at a moderate intensity if your heart starts beating faster and you start breathing faster but can still hold a conversation. You can tell that you are exercising at a vigorous intensity if you are breathing much harder and faster and cannot hold a conversation while exercising. Exercising regularly is important. It has many health benefits, such as:  Improving overall fitness, flexibility, and endurance.  Increasing bone density.  Helping with weight control.  Decreasing body fat.  Increasing muscle strength.  Reducing stress and tension.  Improving overall health. How often should I exercise? Choose an activity that you enjoy, and set realistic goals. Your health care provider can help you make an activity plan that works for you. Exercise regularly as told by your health care provider. This may include:  Doing strength training two times a week, such as: ? Lifting weights. ? Using resistance bands. ? Push-ups. ? Sit-ups. ? Yoga.  Doing a certain intensity of exercise for a given amount of time. Choose from these options: ? A total of 150 minutes of moderate-intensity exercise every week. ? A total of 75 minutes of vigorous-intensity exercise every week. ? A mix of moderate-intensity and vigorous-intensity exercise every week. Children, pregnant women, people who have not exercised  regularly, people who are overweight, and older adults may need to talk with a health care provider about what activities are safe to do. If you have a medical condition, be sure to talk with your health care provider before you start a new exercise program. What are some exercise ideas? Moderate-intensity exercise ideas include:  Walking 1 mile (1.6 km) in about 15 minutes.  Biking.  Hiking.  Golfing.  Dancing.  Water aerobics. Vigorous-intensity exercise ideas include:  Walking 4.5 miles (7.2 km) or more in about 1 hour.  Jogging or running 5 miles (8 km) in about 1 hour.  Biking 10 miles (16.1 km) or more in about 1 hour.  Lap swimming.  Roller-skating or in-line skating.  Cross-country skiing.  Vigorous competitive sports, such as football, basketball, and soccer.  Jumping rope.  Aerobic dancing. What are some everyday activities that can help me to get exercise?  Yard work, such as: ? Pushing a lawn mower. ? Raking and bagging leaves.  Washing your car.  Pushing a stroller.  Shoveling snow.  Gardening.  Washing windows or floors. How can I be more active in my day-to-day activities?  Use stairs instead of an elevator.  Take a walk during your lunch break.  If you drive, park your car farther away from your work or school.  If you take public transportation, get off one stop early and walk the rest of the way.  Stand up or walk around during all of your indoor phone calls.  Get up, stretch, and walk around every 30 minutes throughout the day.  Enjoy exercise with a friend. Support to continue exercising will help you keep a regular routine of activity. What guidelines can I   follow while exercising?  Before you start a new exercise program, talk with your health care provider.  Do not exercise so much that you hurt yourself, feel dizzy, or get very short of breath.  Wear comfortable clothes and wear shoes with good support.  Drink plenty of  water while you exercise to prevent dehydration or heat stroke.  Work out until your breathing and your heartbeat get faster. Where to find more information  [place] Department of Health and Human Services: www.hhs.gov  Centers for Disease Control and Prevention (CDC): www.cdc.gov Summary  Exercising regularly is important. It will improve your overall fitness, flexibility, and endurance.  Regular exercise also will improve your overall health. It can help you control your weight, reduce stress, and improve your bone density.  Do not exercise so much that you hurt yourself, feel dizzy, or get very short of breath.  Before you start a new exercise program, talk with your health care provider. This information is not intended to replace advice given to you by your health care provider. Make sure you discuss any questions you have with your health care provider. Document Revised: 07/10/2017 Document Reviewed: 06/18/2017 Elsevier Patient Education  2020 Else[employer] Inc.   Budget-Friendly Healthy Eating There are many ways to save money at the grocery store and continue to eat healthy. You can be successful if you:  Plan meals according to your budget.  Make a grocery list and only purchase food according to your grocery list.  Prepare food yourself. What are tips for following this plan?  Reading food labels  Compare food labels between brand name foods and the store brand. Often the nutritional value is the same, but the store brand is lower cost.  Look for products that do not have added sugar, fat, or salt (sodium). These often cost the same but are healthier for you. Products may be labeled as: ? Sugar-free. ? Nonfat. ? Low-fat. ? Sodium-free. ? Low-sodium.  Look for lean ground beef labeled as at least 92% lean and 8% fat. Shopping  Buy only the items on your grocery list and go only to the areas of the store that have the items on your list.  Use coupons only for foods  and brands you normally buy. Avoid buying items you wouldn't normally buy simply because they are on sale.  Check online and in newspapers for weekly deals.  Buy healthy items from the bulk bins when available, such as herbs, spices, flour, pasta, nuts, and dried fruit.  Buy fruits and vegetables that are in season. Prices are usually lower on in-season produce.  Look at the unit price on the price tag. Use it to compare different brands and sizes to find out which item is the best deal.  Choose healthy items that are often low-cost, such as carrots, potatoes, apples, bananas, and oranges. Dried or canned beans are a low-cost protein source.  Buy in bulk and freeze extra food. Items you can buy in bulk include meats, fish, poultry, frozen fruits, and frozen vegetables.  Avoid buying "ready-to-eat" foods, such as pre-cut fruits and vegetables and pre-made salads.  If possible, shop around to discover where you can find the best prices. Consider other retailers such as dollar stores, larger wholesale stores, local fruit and vegetable stands, and farmers markets.  Do not shop when you are hungry. If you shop while hungry, it may be hard to stick to your list and budget.  Resist impulse buying. Use your grocery list as your   official plan for the week.  Buy a variety of vegetables and fruits by purchasing fresh, frozen, and canned items.  Look at the top and bottom shelves for deals. Foods at eye level (eye level of an adult or child) are usually more expensive.  Be efficient with your time when shopping. The more time you spend at the store, the more money you are likely to spend.  To save money when choosing more expensive foods like meats and dairy: ? Choose cheaper cuts of meat, such as bone-in chicken thighs and drumsticks instead of skinless and boneless chicken. When you are ready to prepare the chicken, you can remove the skin yourself to make it healthier. ? Choose lean meats like  chicken or turkey instead of beef. ? Choose canned seafood, such as tuna, salmon, or sardines. ? Buy eggs as a low-cost source of protein. ? Buy dried beans and peas, such as lentils, split peas, or kidney beans instead of meats. Dried beans and peas are a good alternative source of protein. ? Buy the larger tubs of yogurt instead of individual-sized containers.  Choose water instead of sodas and other sweetened beverages.  Avoid buying chips, cookies, and other "junk food." These items are usually expensive and not healthy. Cooking  Make extra food and freeze the extras in meal-sized containers or in individual portions for fast meals and snacks.  Pre-cook on days when you have extra time to prepare meals in advance. You can keep these meals in the fridge or freezer and reheat for a quick meal.  When you come home from the grocery store, wash, peel, and cut fruits and vegetables so they are ready to use and eat. This will help reduce food waste. Meal planning  Do not eat out or get fast food. Prepare food at home.  Make a grocery list and make sure to bring it with you to the store. If you have a smart phone, you could use your phone to create your shopping list.  Plan meals and snacks according to a grocery list and budget you create.  Use leftovers in your meal plan for the week.  Look for recipes where you can cook once and make enough food for two meals.  Include budget-friendly meals like stews, casseroles, and stir-fry dishes.  Try some meatless meals or try "no cook" meals like salads.  Make sure that half your plate is filled with fruits or vegetables. Choose from fresh, frozen, or canned fruits and vegetables. If eating canned, remember to rinse them before eating. This will remove any excess salt added for packaging. Summary  Eating healthy on a budget is possible if you plan your meals according to your budget, purchase according to your budget and grocery list, and  prepare food yourself.  Tips for buying more food on a limited budget include buying generic brands, using coupons only for foods you normally buy, and buying healthy items from the bulk bins when available.  Tips for buying cheaper food to replace expensive food include choosing cheaper, lean cuts of meat, and buying dried beans and peas. This information is not intended to replace advice given to you by your health care provider. Make sure you discuss any questions you have with your health care provider. Document Revised: 07/29/2017 Document Reviewed: 07/29/2017 Elsevier Patient Education  2020 Elsevier Inc.   Bone Health Bones protect organs, store calcium, anchor muscles, and support the whole body. Keeping your bones strong is important, especially as you get   older. You can take actions to help keep your bones strong and healthy. Why is keeping my bones healthy important?  Keeping your bones healthy is important because your body constantly replaces bone cells. Cells get old, and new cells take their place. As we age, we lose bone cells because the body may not be able to make enough new cells to replace the old cells. The amount of bone cells and bone tissue you have is referred to as bone mass. The higher your bone mass, the stronger your bones. The aging process leads to an overall loss of bone mass in the body, which can increase the likelihood of:  Joint pain and stiffness.  Broken bones.  A condition in which the bones become weak and brittle (osteoporosis). A large decline in bone mass occurs in older adults. In women, it occurs about the time of menopause. What actions can I take to keep my bones healthy? Good health habits are important for maintaining healthy bones. This includes eating nutritious foods and exercising regularly. To have healthy bones, you need to get enough of the right minerals and vitamins. Most nutrition experts recommend getting these nutrients from the  foods that you eat. In some cases, taking supplements may also be recommended. Doing certain types of exercise is also important for bone health. What are the nutritional recommendations for healthy bones?  Eating a well-balanced diet with plenty of calcium and vitamin D will help to protect your bones. Nutritional recommendations vary from person to person. Ask your health care provider what is healthy for you. Here are some general guidelines. Get enough calcium Calcium is the most important (essential) mineral for bone health. Most people can get enough calcium from their diet, but supplements may be recommended for people who are at risk for osteoporosis. Good sources of calcium include:  Dairy products, such as low-fat or nonfat milk, cheese, and yogurt.  Dark green leafy vegetables, such as bok choy and broccoli.  Calcium-fortified foods, such as orange juice, cereal, bread, soy beverages, and tofu products.  Nuts, such as almonds. Follow these recommended amounts for daily calcium intake:  Children, age 1-3: 700 mg.  Children, age 4-8: 1,000 mg.  Children, age 9-13: 1,300 mg.  Teens, age 14-18: 1,300 mg.  Adults, age 19-50: 1,000 mg.  Adults, age 51-70: ? Men: 1,000 mg. ? Women: 1,200 mg.  Adults, age 71 or older: 1,200 mg.  Pregnant and breastfeeding females: ? Teens: 1,300 mg. ? Adults: 1,000 mg. Get enough vitamin D Vitamin D is the most essential vitamin for bone health. It helps the body absorb calcium. Sunlight stimulates the skin to make vitamin D, so be sure to get enough sunlight. If you live in a cold climate or you do not get outside often, your health care provider may recommend that you take vitamin D supplements. Good sources of vitamin D in your diet include:  Egg yolks.  Saltwater fish.  Milk and cereal fortified with vitamin D. Follow these recommended amounts for daily vitamin D intake:  Children and teens, age 1-18: 600 international units.   Adults, age 50 or younger: 400-800 international units.  Adults, age 51 or older: 800-1,000 international units. Get other important nutrients Other nutrients that are important for bone health include:  Phosphorus. This mineral is found in meat, poultry, dairy foods, nuts, and legumes. The recommended daily intake for adult men and adult women is 700 mg.  Magnesium. This mineral is found in seeds, nuts, dark green   vegetables, and legumes. The recommended daily intake for adult men is 400-420 mg. For adult women, it is 310-320 mg.  Vitamin K. This vitamin is found in green leafy vegetables. The recommended daily intake is 120 mg for adult men and 90 mg for adult women. What type of physical activity is best for building and maintaining healthy bones? Weight-bearing and strength-building activities are important for building and maintaining healthy bones. Weight-bearing activities cause muscles and bones to work against gravity. Strength-building activities increase the strength of the muscles that support bones. Weight-bearing and muscle-building activities include:  Walking and hiking.  Jogging and running.  Dancing.  Gym exercises.  Lifting weights.  Tennis and racquetball.  Climbing stairs.  Aerobics. Adults should get at least 30 minutes of moderate physical activity on most days. Children should get at least 60 minutes of moderate physical activity on most days. Ask your health care provider what type of exercise is best for you. How can I find out if my bone mass is low? Bone mass can be measured with an X-ray test called a bone mineral density (BMD) test. This test is recommended for all women who are age 65 or older. It may also be recommended for:  Men who are age 70 or older.  People who are at risk for osteoporosis because of: ? Having bones that break easily. ? Having a long-term disease that weakens bones, such as kidney disease or rheumatoid arthritis. ? Having  menopause earlier than normal. ? Taking medicine that weakens bones, such as steroids, thyroid hormones, or hormone treatment for breast cancer or prostate cancer. ? Smoking. ? Drinking three or more alcoholic drinks a day. If you find that you have a low bone mass, you may be able to prevent osteoporosis or further bone loss by changing your diet and lifestyle. Where can I find more information? For more information, check out the following websites:  National Osteoporosis Foundation: www.nof.org/patients  National Institutes of Health: www.bones.nih.gov  International Osteoporosis Foundation: www.iofbonehealth.org Summary  The aging process leads to an overall loss of bone mass in the body, which can increase the likelihood of broken bones and osteoporosis.  Eating a well-balanced diet with plenty of calcium and vitamin D will help to protect your bones.  Weight-bearing and strength-building activities are also important for building and maintaining strong bones.  Bone mass can be measured with an X-ray test called a bone mineral density (BMD) test. This information is not intended to replace advice given to you by your health care provider. Make sure you discuss any questions you have with your health care provider. Document Revised: 08/24/2017 Document Reviewed: 08/24/2017 Elsevier Patient Education  2020 Elsevier Inc.   Managing Anxiety, Adult After being diagnosed with an anxiety disorder, you may be relieved to know why you have felt or behaved a certain way. You may also feel overwhelmed about the treatment ahead and what it will mean for your life. With care and support, you can manage this condition and recover from it. How to manage lifestyle changes Managing stress and anxiety  Stress is your body's reaction to life changes and events, both good and bad. Most stress will last just a few hours, but stress can be ongoing and can lead to more than just stress. Although  stress can play a major role in anxiety, it is not the same as anxiety. Stress is usually caused by something external, such as a deadline, test, or competition. Stress normally passes after the triggering   event has ended.  Anxiety is caused by something internal, such as imagining a terrible outcome or worrying that something will go wrong that will devastate you. Anxiety often does not go away even after the triggering event is over, and it can become long-term (chronic) worry. It is important to understand the differences between stress and anxiety and to manage your stress effectively so that it does not lead to an anxious response. Talk with your health care provider or a counselor to learn more about reducing anxiety and stress. He or she may suggest tension reduction techniques, such as:  Music therapy. This can include creating or listening to music that you enjoy and that inspires you.  Mindfulness-based meditation. This involves being aware of your normal breaths while not trying to control your breathing. It can be done while sitting or walking.  Centering prayer. This involves focusing on a word, phrase, or sacred image that means something to you and brings you peace.  Deep breathing. To do this, expand your stomach and inhale slowly through your nose. Hold your breath for 3-5 seconds. Then exhale slowly, letting your stomach muscles relax.  Self-talk. This involves identifying thought patterns that lead to anxiety reactions and changing those patterns.  Muscle relaxation. This involves tensing muscles and then relaxing them. Choose a tension reduction technique that suits your lifestyle and personality. These techniques take time and practice. Set aside 5-15 minutes a day to do them. Therapists can offer counseling and training in these techniques. The training to help with anxiety may be covered by some insurance plans. Other things you can do to manage stress and anxiety include:   Keeping a stress/anxiety diary. This can help you learn what triggers your reaction and then learn ways to manage your response.  Thinking about how you react to certain situations. You may not be able to control everything, but you can control your response.  Making time for activities that help you relax and not feeling guilty about spending your time in this way.  Visual imagery and yoga can help you stay calm and relax.  Medicines Medicines can help ease symptoms. Medicines for anxiety include:  Anti-anxiety drugs.  Antidepressants. Medicines are often used as a primary treatment for anxiety disorder. Medicines will be prescribed by a health care provider. When used together, medicines, psychotherapy, and tension reduction techniques may be the most effective treatment. Relationships Relationships can play a big part in helping you recover. Try to spend more time connecting with trusted friends and family members. Consider going to couples counseling, taking family education classes, or going to family therapy. Therapy can help you and others better understand your condition. How to recognize changes in your anxiety Everyone responds differently to treatment for anxiety. Recovery from anxiety happens when symptoms decrease and stop interfering with your daily activities at home or work. This may mean that you will start to:  Have better concentration and focus. Worry will interfere less in your daily thinking.  Sleep better.  Be less irritable.  Have more energy.  Have improved memory. It is important to recognize when your condition is getting worse. Contact your health care provider if your symptoms interfere with home or work and you feel like your condition is not improving. Follow these instructions at home: Activity  Exercise. Most adults should do the following: ? Exercise for at least 150 minutes each week. The exercise should increase your heart rate and make you sweat  (moderate-intensity exercise). ? Strengthening exercises   at least twice a week.  Get the right amount and quality of sleep. Most adults need 7-9 hours of sleep each night. Lifestyle   Eat a healthy diet that includes plenty of vegetables, fruits, whole grains, low-fat dairy products, and lean protein. Do not eat a lot of foods that are high in solid fats, added sugars, or salt.  Make choices that simplify your life.  Do not use any products that contain nicotine or tobacco, such as cigarettes, e-cigarettes, and chewing tobacco. If you need help quitting, ask your health care provider.  Avoid caffeine, alcohol, and certain over-the-counter cold medicines. These may make you feel worse. Ask your pharmacist which medicines to avoid. General instructions  Take over-the-counter and prescription medicines only as told by your health care provider.  Keep all follow-up visits as told by your health care provider. This is important. Where to find support You can get help and support from these sources:  Self-help groups.  Online and community organizations.  A trusted spiritual leader.  Couples counseling.  Family education classes.  Family therapy. Where to find more information You may find that joining a support group helps you deal with your anxiety. The following sources can help you locate counselors or support groups near you:  Mental Health America: www.mentalhealthamerica.net  Anxiety and Depression Association of America (ADAA): www.adaa.org  National Alliance on Mental Illness (NAMI): www.nami.org Contact a health care provider if you:  Have a hard time staying focused or finishing daily tasks.  Spend many hours a day feeling worried about everyday life.  Become exhausted by worry.  Start to have headaches, feel tense, or have nausea.  Urinate more than normal.  Have diarrhea. Get help right away if you have:  A racing heart and shortness of breath.   Thoughts of hurting yourself or others. If you ever feel like you may hurt yourself or others, or have thoughts about taking your own life, get help right away. You can go to your nearest emergency department or call:  Your local emergency services (911 in the [place]  A suicide crisis helpline, such as the National Suicide Prevention Lifeline at 1-800-273-8255. This is open 24 hours a day. Summary  Taking steps to learn and use tension reduction techniques can help calm you and help prevent triggering an anxiety reaction.  When used together, medicines, psychotherapy, and tension reduction techniques may be the most effective treatment.  Family, friends, and partners can play a big part in helping you recover from an anxiety disorder. This information is not intended to replace advice given to you by your health care provider. Make sure you discuss any questions you have with your health care provider. Document Revised: 12/28/2018 Document Reviewed: 12/28/2018 Elsevier Patient Education  2020 Else[employer] Inc.   

## 2020-04-13 NOTE — Telephone Encounter (Signed)
Patient contacted via telephone and she reported she did start flonase and nasal saline and nasal symptoms have resolved along with cough.  Fatigue improving and no longer sleeping longer.  Denied new covid positive known contacts or fever/chills/vomiting/diarrhea in the previous 24 hours.  Patient had reviewed PCR test results in my chart last night negative.  She had no further questions or concerns at this time.  Patient cleared to return to work.  She is to discuss with supervisor if she should return to work today or Monday 16 Apr 2020.  Continue flonase/nasal saline and coricidin HBP prn, hydrate.  Continue social distancing, mask wear, frequent hand washing/sanitizing and consider covid vaccination ( I recommend vaccination)  Patient verbalized understanding of instructions and had no further questions at this time.

## 2020-04-20 LAB — CYTOLOGY - PAP
Chlamydia: NEGATIVE
Comment: NEGATIVE
Comment: NEGATIVE
Comment: NEGATIVE
Comment: NORMAL
Diagnosis: NEGATIVE
High risk HPV: NEGATIVE
Neisseria Gonorrhea: NEGATIVE
Trichomonas: NEGATIVE

## 2020-05-10 ENCOUNTER — Ambulatory Visit
Admission: RE | Admit: 2020-05-10 | Discharge: 2020-05-10 | Disposition: A | Discharge status: Home / Self Care | Payer: BC Managed Care – PPO | Source: Ambulatory Visit | Attending: Obstetrics and Gynecology | Admitting: Obstetrics and Gynecology

## 2020-05-10 ENCOUNTER — Other Ambulatory Visit: Payer: Self-pay

## 2020-05-10 DIAGNOSIS — Z1231 Encounter for screening mammogram for malignant neoplasm of breast: Secondary | ICD-10-CM | POA: Diagnosis not present

## 2020-06-18 ENCOUNTER — Ambulatory Visit: Payer: 59 | Admitting: Family Medicine

## 2020-06-18 NOTE — Progress Notes (Deleted)
Name: Bethany [name]   MRN: [id number]    DOB: [birth year]   Date:06/18/2020       Progress Note  Subjective  Chief Complaint  Follow up   HPI HTN/palpitation: she takes medication at 5 pm , she is here from work and is going back for an extra shift. BP is borderline, she states she has not noticed any palpitation lately. No chest pain. She is going to the gym twice a week and taking boot camp classes. She has good exercise tolerance   *** Patient Active Problem List   Diagnosis Date Noted  . Mitral valve prolapse 01/11/2019  . Hypertension, benign 09/14/2018  . Chronic left hip pain 12/04/2017  . Smell disturbance 12/04/2017  . Obesity, Class II, BMI 35-39.9 12/04/2017  . Abnormal finding on MRI of brain 11/05/2017  . 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 Patellofemoral stress syndrome of both knees 08/09/2014    Past Surgical History:  Procedure Laterality Date  . CESAREAN SECTION  2002  . CESAREAN SECTION  2005    Family History  Problem Relation Age of Onset  . Emphysema Mother   . Hypercalcemia Mother   . Hypertension Mother   . Sickle cell trait Mother   . Clotting disorder Mother   . Prostate cancer Father   . Diabetes Mellitus II Father   . Hypertension Father   . Heart Problems Father        Enlarged Heart  . Thyroid disease Sister   . Asthma Paternal Grandmother   . Lung cancer Maternal Uncle   . Lung cancer Maternal Aunt   . Breast cancer Maternal Aunt        early 53's  . Kidney disease Paternal Aunt     Social History   Tobacco Use  . Smoking status: Never Smoker  . Smokeless tobacco: Never Used  Substance Use Topics  . Alcohol use: Not Currently    Alcohol/week: 0.0 standard drinks    Comment: occasionally     Current Outpatient Medications:  .  Ascorbic Acid (VITAMIN C) POWD, Take by mouth., Disp: , Rfl:  .  fluticasone (FLONASE) 50 MCG/ACT nasal spray, Place 1 spray into both nostrils 2 (two) times daily as needed for allergies or  rhinitis., Disp: 16 g, Rfl: 1 .  levonorgestrel-ethinyl estradiol (AVIANE) 0.1-20 MG-MCG tablet, Take 1 tablet by mouth daily., Disp: 84 tablet, Rfl: 4 .  metoprolol succinate (TOPROL-XL) 25 MG 24 hr tablet, Take 1 tablet (25 mg total) by mouth daily., Disp: 90 tablet, Rfl: 1 .  Multiple Vitamin (MULTIVITAMIN WITH MINERALS) TABS tablet, Take 1 tablet by mouth daily., Disp: , Rfl:  .  Probiotic Product (UP4 PROBIOTICS WOMENS PO), Take by mouth., Disp: , Rfl:  .  sodium chloride (OCEAN) 0.65 % SOLN nasal spray, Place 2 sprays into both nostrils every 2 (two) hours while awake., Disp: , Rfl: 0  Allergies  Allergen Reactions  . Pantoprazole     Pain in lef,t arm numbness and trouble moving arm     I personally reviewed {Reviewed:14835} with the patient/caregiver today.   ROS  ***  Objective  There were no vitals filed for this visit.  There is no height or weight on file to calculate BMI.  Physical Exam ***  Recent Results (from the past 2160 hour(s))  POC COVID-19     Status: Normal   Collection Time: 04/09/20  2:30 PM  Result Value Ref Range   SARS Coronavirus 2 Ag Negative Negative  Comment: Pt aware. Virtual visit completed with H.Ratcliffe PAC.   Novel Coronavirus, NAA ([facility])     Status: None   Collection Time: 04/11/20  3:30 PM   Specimen: Nasopharyngeal(NP) swabs in vial transport medium   Nasopharynge  Is this  Result Value Ref Range   SARS-CoV-2, NAA Not Detected Not Detected    Comment: This nucleic acid amplification test was developed and its performance characteristics determined by [employer] [facility]. Nucleic acid amplification tests include RT-PCR and TMA. This test has not been FDA cleared or approved. This test has been authorized by FDA under an Emergency Use Authorization (EUA). This test is only authorized for the duration of time the declaration that circumstances exist justifying the authorization of the emergency use of in vitro diagnostic  tests for detection of SARS-CoV-2 virus and/or diagnosis of COVID-19 infection under section 564(b)(1) of the Act, 21 U.S.C. 654YTK-3(T) (1), unless the authorization is terminated or revoked sooner. When diagnostic testing is negative, the possibility of a false negative result should be considered in the context of a patient's recent exposures and the presence of clinical signs and symptoms consistent with COVID-19. An individual without symptoms of COVID-19 and who is not shedding SARS-CoV-2 virus wo uld expect to have a negative (not detected) result in this assay.   POC COVID-19     Status: Normal   Collection Time: 04/11/20  3:36 PM  Result Value Ref Range   SARS Coronavirus 2 Ag Negative Negative    Comment: Symtomatic patient aware of negative POC test result, PCR sent to [facility]. Patient had telephone visit with Ilean Skill NP for further instructions.  Cytology, thin prep pap (cervical)     Status: None   Collection Time: 04/13/20  4:32 PM  Result Value Ref Range   High risk HPV Negative    Neisseria Gonorrhea Negative    Chlamydia Negative    Trichomonas Negative    Adequacy      Satisfactory for evaluation; transformation zone component PRESENT.   Diagnosis      - Negative for intraepithelial lesion or malignancy (NILM)   Comment Normal Reference Ranger Chlamydia - Negative    Comment      Normal Reference Range Neisseria Gonorrhea - Negative   Comment Normal Reference Range Trichomonas - Negative    Comment Normal Reference Range HPV - Negative     Diabetic Foot Exam: Diabetic Foot Exam - Simple   No data filed     ***  PHQ2/9: Depression screen [facility] 2/9 04/13/2020 12/14/2019 11/16/2019 05/18/2019 02/15/2019  Decreased Interest 0 0 0 1 1  Down, Depressed, Hopeless 1 0 0 0 0  PHQ - 2 Score 1 0 0 1 1  Altered sleeping 1 0 0 1 1  Tired, decreased energy 1 0 1 1 0  Change in appetite 0 0 0 0 0  Feeling bad or failure about yourself  0 0 0 0 0  Trouble concentrating  0 0 0 0 0  Moving slowly or fidgety/restless 0 0 0 0 0  Suicidal thoughts 0 0 0 0 0  PHQ-9 Score 3 0 1 3 2   Difficult doing work/chores Somewhat difficult - Not difficult at all Not difficult at all -  Some recent data might be hidden    phq 9 is {gen pos ***  Fall Risk: Fall Risk  12/14/2019 11/16/2019 05/18/2019 02/15/2019 01/19/2019  Falls in the past year? 0 0 0 0 0  Number falls in past yr: 0  0 0 0 0  Injury with Fall? 0 0 0 0 0   ***   Functional Status Survey:   ***   Assessment & Plan  *** There are no diagnoses linked to this encounter.

## 2020-06-19 ENCOUNTER — Other Ambulatory Visit: Payer: Self-pay | Admitting: Family Medicine

## 2020-06-19 DIAGNOSIS — R002 Palpitations: Secondary | ICD-10-CM

## 2020-06-19 DIAGNOSIS — I10 Essential (primary) hypertension: Secondary | ICD-10-CM

## 2020-06-21 ENCOUNTER — Ambulatory Visit: Payer: 59 | Admitting: Family Medicine

## 2020-07-17 NOTE — Progress Notes (Signed)
Name: Bethany [name]   MRN: [id number]    DOB: Oct 08, 1980   Date:07/18/2020       Progress Note  Subjective  Chief Complaint  Medication Refill  HPI   MDD/Anxiety:she tried medication Duloxetin, Prozac and hydroxyzine but had side effects and stopped it on her own.She had lots of stress since 03/24/2019. She had a lot of death recently in her family, going to funeral in [place] for her children's great-grandmother and after that [place] for her uncles funeral. She has been doing well work wife. Left [employer] and is working for [employer]. Daughter is back at home and helps her emotionally, she states her son is doing well in school and seems to be doing better about his self image  Pre-diabetes: on a low sugar diet, she will resume going to the gym soon   Migraine and left arm numbness:she states taking magnesium in her MVI. She does not recall when she had last migraine episode in the past year or so.   PVC: she saw cardiologist last year and had Echo and EKG, also had holter monitor and is due for follow up with cardiologist , she needs a referral    Normal left ventricular systolic function, ejection fraction > 55%  Mitral valve prolapse - moderate  Mitral regurgitation - mild  Normal right ventricular systolic function  Holter event monitor ( June 2020 )  Patient had a min HR of 36 bpm, max HR of 154 bpm, and avg HR of 72 bpm. Predominant underlying rhythm was Sinus Rhythm. Isolated SVEs were rare (<1.0%), SVE Couplets were rare (<1.0%), and no SVE Triplets were present. Isolated VEs were occasional (1.3%, 18369), VE Couplets were rare (<1.0%, 39), and VE Triplets were rare (<1.0%, 4). Ventricular Bigeminy and Trigeminy were present. One patient triggered event was associated with sinus rhythm.  Patient Active Problem List   Diagnosis Date Noted  . Mitral valve prolapse 01/11/2019  . Hypertension, benign 09/14/2018  . Chronic left hip pain 12/04/2017  .  Smell disturbance 12/04/2017  . Obesity, Class II, BMI 35-39.9 12/04/2017  . Abnormal finding on MRI of brain 11/05/2017  . 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 Patellofemoral stress syndrome of both knees 08/09/2014    Past Surgical History:  Procedure Laterality Date  . CESAREAN SECTION  2002  . CESAREAN SECTION  2005    Family History  Problem Relation Age of Onset  . Emphysema Mother   . Hypercalcemia Mother   . Hypertension Mother   . Sickle cell trait Mother   . Clotting disorder Mother   . Prostate cancer Father   . Diabetes Mellitus II Father   . Hypertension Father   . Heart Problems Father        Enlarged Heart  . Thyroid disease Sister   . Asthma Paternal Grandmother   . Lung cancer Maternal Uncle   . Lung cancer Maternal Aunt   . Breast cancer Maternal Aunt        early 25's  . Kidney disease Paternal Aunt     Social History   Tobacco Use  . Smoking status: Never Smoker  . Smokeless tobacco: Never Used  Substance Use Topics  . Alcohol use: Not Currently    Alcohol/week: 0.0 standard drinks    Comment: occasionally     Current Outpatient Medications:  .  Ascorbic Acid (VITAMIN C) POWD, Take by mouth., Disp: , Rfl:  .  levonorgestrel-ethinyl estradiol (AVIANE) 0.1-20 MG-MCG tablet, Take 1 tablet  by mouth daily., Disp: 84 tablet, Rfl: 4 .  metoprolol succinate (TOPROL-XL) 25 MG 24 hr tablet, Take 1 tablet by mouth once daily, Disp: 30 tablet, Rfl: 0 .  Multiple Vitamin (MULTIVITAMIN WITH MINERALS) TABS tablet, Take 1 tablet by mouth daily., Disp: , Rfl:  .  Probiotic Product (UP4 PROBIOTICS WOMENS PO), Take by mouth., Disp: , Rfl:  .  fluticasone (FLONASE) 50 MCG/ACT nasal spray, Place 1 spray into both nostrils 2 (two) times daily as needed for allergies or rhinitis., Disp: 16 g, Rfl: 1 .  sodium chloride (OCEAN) 0.65 % SOLN nasal spray, Place 2 sprays into both nostrils every 2 (two) hours while awake., Disp: , Rfl: 0  Allergies  Allergen  Reactions  . Pantoprazole     Pain in lef,t arm numbness and trouble moving arm     I personally reviewed active problem list, medication list, allergies, family history, social history, health maintenance, notes from last encounter with the patient/caregiver today.   ROS  Ten systems reviewed and is negative except as mentioned in HPI   Objective  Vitals:   07/18/20 1129  BP: 118/78  Pulse: 71  Resp: 16  Temp: 98.4 F (36.9 C)  TempSrc: Oral  SpO2: 100%  Weight: 171 lb 4.8 oz (77.7 kg)  Height: 5' (1.524 m)    Body mass index is 33.45 kg/m.  Physical Exam  Constitutional: Patient appears well-developed and well-nourished. Obese  No distress.  HEENT: head atraumatic, normocephalic, pupils equal and reactive to light,  neck supple Cardiovascular: Normal rate, regular rhythm and normal heart sounds.  No murmur heard. No BLE edema. Pulmonary/Chest: Effort normal and breath sounds normal. No respiratory distress. Abdominal: Soft.  There is no tenderness. Psychiatric: Patient has a normal mood and affect. behavior is normal. Judgment and thought content normal.  PHQ2/9: Depression screen [facility] 2/9 07/18/2020 04/13/2020 12/14/2019 11/16/2019 05/18/2019  Decreased Interest 0 0 0 0 1  Down, Depressed, Hopeless 0 1 0 0 0  PHQ - 2 Score 0 1 0 0 1  Altered sleeping 1 1 0 0 1  Tired, decreased energy 0 1 0 1 1  Change in appetite 0 0 0 0 0  Feeling bad or failure about yourself  0 0 0 0 0  Trouble concentrating 0 0 0 0 0  Moving slowly or fidgety/restless 0 0 0 0 0  Suicidal thoughts 0 0 0 0 0  PHQ-9 Score 1 3 0 1 3  Difficult doing work/chores Not difficult at all Somewhat difficult - Not difficult at all Not difficult at all  Some recent data might be hidden    phq 9 is negative   Fall Risk: Fall Risk  07/18/2020 12/14/2019 11/16/2019 05/18/2019 02/15/2019  Falls in the past year? 0 0 0 0 0  Number falls in past yr: 0 0 0 0 0  Injury with Fall? 0 0 0 0 0    Functional Status  Survey: Is the patient deaf or have difficulty hearing?: No Does the patient have difficulty seeing, even when wearing glasses/contacts?: Yes Does the patient have difficulty concentrating, remembering, or making decisions?: No Does the patient have difficulty walking or climbing stairs?: No Does the patient have difficulty dressing or bathing?: No Does the patient have difficulty doing errands alone such as visiting a doctor's office or shopping?: No    Assessment & Plan  1. PVC (premature ventricular contraction)  - Ambulatory referral to Cardiology - metoprolol succinate (TOPROL-XL) 25 MG 24 hr tablet;  Take 1 tablet (25 mg total) by mouth daily.  Dispense: 90 tablet; Refill: 3  2. Hypertension, benign  - metoprolol succinate (TOPROL-XL) 25 MG 24 hr tablet; Take 1 tablet (25 mg total) by mouth daily.  Dispense: 90 tablet; Refill: 3  3. Pre-diabetes    4. Migraine without aura and without status migrainosus, not intractable  No symptoms on beta-blocker   5. GAD (generalized anxiety disorder)   6. Major depression in remission [facility])  Doing well at this time  7. Mitral valve prolapse   8. Perennial allergic rhinitis  - fluticasone (FLONASE) 50 MCG/ACT nasal spray; Place 1 spray into both nostrils 2 (two) times daily as needed for allergies or rhinitis.  Dispense: 16 g; Refill: 5

## 2020-07-18 ENCOUNTER — Other Ambulatory Visit: Payer: Self-pay

## 2020-07-18 ENCOUNTER — Ambulatory Visit: Payer: BC Managed Care – PPO | Admitting: Family Medicine

## 2020-07-18 ENCOUNTER — Encounter: Payer: Self-pay | Admitting: Family Medicine

## 2020-07-18 VITALS — BP 118/78 | HR 71 | Temp 98.4°F | Resp 16 | Ht 60.0 in | Wt 171.3 lb

## 2020-07-18 DIAGNOSIS — G43009 Migraine without aura, not intractable, without status migrainosus: Secondary | ICD-10-CM

## 2020-07-18 DIAGNOSIS — R7303 Prediabetes: Secondary | ICD-10-CM

## 2020-07-18 DIAGNOSIS — I493 Ventricular premature depolarization: Secondary | ICD-10-CM

## 2020-07-18 DIAGNOSIS — J3089 Other allergic rhinitis: Secondary | ICD-10-CM

## 2020-07-18 DIAGNOSIS — I341 Nonrheumatic mitral (valve) prolapse: Secondary | ICD-10-CM

## 2020-07-18 DIAGNOSIS — I10 Essential (primary) hypertension: Secondary | ICD-10-CM

## 2020-07-18 DIAGNOSIS — F411 Generalized anxiety disorder: Secondary | ICD-10-CM

## 2020-07-18 DIAGNOSIS — F325 Major depressive disorder, single episode, in full remission: Secondary | ICD-10-CM

## 2020-07-18 MED ORDER — FLUTICASONE PROPIONATE 50 MCG/ACT NA SUSP: 1.0000 | Freq: Two times a day (BID) | NASAL | 5 refills | Status: DC | PRN

## 2020-07-18 MED ORDER — METOPROLOL SUCCINATE ER 25 MG PO TB24: 25.0000 mg | ORAL_TABLET | Freq: Every day | ORAL | 3 refills | Status: DC

## 2020-07-23 DIAGNOSIS — Z20822 Contact with and (suspected) exposure to covid-19: Secondary | ICD-10-CM | POA: Diagnosis not present

## 2020-07-29 DIAGNOSIS — Z20822 Contact with and (suspected) exposure to covid-19: Secondary | ICD-10-CM | POA: Diagnosis not present

## 2020-08-02 ENCOUNTER — Encounter: Payer: Self-pay | Admitting: Family Medicine

## 2020-08-23 DIAGNOSIS — I341 Nonrheumatic mitral (valve) prolapse: Secondary | ICD-10-CM | POA: Diagnosis not present

## 2020-08-23 DIAGNOSIS — R002 Palpitations: Secondary | ICD-10-CM | POA: Insufficient documentation

## 2020-08-23 DIAGNOSIS — Z6833 Body mass index (BMI) 33.0-33.9, adult: Secondary | ICD-10-CM | POA: Diagnosis not present

## 2020-09-03 DIAGNOSIS — Z20822 Contact with and (suspected) exposure to covid-19: Secondary | ICD-10-CM | POA: Diagnosis not present

## 2020-10-19 DIAGNOSIS — H35413 Lattice degeneration of retina, bilateral: Secondary | ICD-10-CM | POA: Diagnosis not present

## 2020-10-19 DIAGNOSIS — H43823 Vitreomacular adhesion, bilateral: Secondary | ICD-10-CM | POA: Diagnosis not present

## 2020-11-29 ENCOUNTER — Encounter: Payer: Self-pay | Admitting: Family Medicine

## 2020-12-10 ENCOUNTER — Encounter: Payer: Self-pay | Admitting: Family Medicine

## 2020-12-17 ENCOUNTER — Ambulatory Visit: Payer: BC Managed Care – PPO

## 2020-12-17 ENCOUNTER — Other Ambulatory Visit: Payer: Self-pay

## 2020-12-17 ENCOUNTER — Telehealth: Payer: BC Managed Care – PPO | Admitting: Medical

## 2020-12-17 DIAGNOSIS — B3731 Acute candidiasis of vulva and vagina: Secondary | ICD-10-CM

## 2020-12-17 DIAGNOSIS — B373 Candidiasis of vulva and vagina: Secondary | ICD-10-CM

## 2020-12-17 DIAGNOSIS — J0110 Acute frontal sinusitis, unspecified: Secondary | ICD-10-CM

## 2020-12-17 DIAGNOSIS — Z20822 Contact with and (suspected) exposure to covid-19: Secondary | ICD-10-CM

## 2020-12-17 LAB — POC COVID19 BINAXNOW: SARS Coronavirus 2 Ag: NEGATIVE

## 2020-12-17 MED ORDER — AMOXICILLIN-POT CLAVULANATE 875-125 MG PO TABS: 1.0000 | ORAL_TABLET | Freq: Two times a day (BID) | ORAL | 0 refills | Status: DC

## 2020-12-17 MED ORDER — FLUCONAZOLE 150 MG PO TABS: 150.0000 mg | ORAL_TABLET | Freq: Every day | ORAL | 0 refills | Status: DC

## 2020-12-17 NOTE — Patient Instructions (Signed)

## 2020-12-17 NOTE — Progress Notes (Signed)
   Subjective:    Patient ID: Bethany [name], female    DOB: Mar 25, 1981, 40 y.o.   MRN: [id number]  HPI 40 yo female in non acute distress, consents to telemedicine appointment. Started Wednesday took Zyrtec felt better then symptoms  returned on Friday with nasal congestion, runny nose , and sinus  pressure between eyes and Headache, .Nasal discharge is  Yellow. Sore throat and hoarseness.   Alson  Coricidan Hbp and   And Flonase. 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 Review of Systems  Constitutional: Negative for chills and fever.  HENT: Positive for congestion, rhinorrhea, sinus pressure, sneezing and sore throat.   Respiratory: Negative for cough and shortness of breath.   Cardiovascular: Negative for chest pain.  Gastrointestinal: Negative for abdominal pain and diarrhea.  Neurological: Positive for headaches.       Objective:   Physical Exam  Nasal congested sounding AXOX3 No physical performed due to telemedicine appointment.  .    Results for orders placed or performed in visit on 12/17/20 (from the past 24 hour(s))  POC COVID-19     Status: Normal   Collection Time: 12/17/20  1:48 PM  Result Value Ref Range   SARS Coronavirus 2 Ag Negative Negative   Assessment & Plan:  Sinusitis  phyaryngitis Continue Zyrtec and Flonase and Coricidan Hbp per package instructions. OTC Motrin or Tylenol if Headache or if not feeling well or fever. Dilute Salt water gargles 3-4 times /day. Call on 8:30 am Wednesday for a recheck to possibly return to work. Work note done in [employer]. Patient verbalizes understanding and has no questions at the end of our conversation.

## 2020-12-19 ENCOUNTER — Other Ambulatory Visit: Payer: Self-pay

## 2020-12-19 ENCOUNTER — Encounter: Payer: Self-pay | Admitting: Medical

## 2020-12-19 ENCOUNTER — Telehealth: Payer: BC Managed Care – PPO | Admitting: Medical

## 2020-12-19 NOTE — Progress Notes (Signed)
   Subjective:    Patient ID: Bethany [name], female    DOB: July 30, 1981, 40 y.o.   MRN: [id number]  HPI 40 yo female in non acute distress consents to telemedicine appointment.She is feeling better after starting antibiotic. She has a slight cough, hoarse voice and    Review of Systems  Constitutional: Positive for fatigue. Negative for chills and fever.  HENT: Positive for voice change.   Respiratory: Positive for cough. Negative for shortness of breath.   Cardiovascular: Negative for chest pain.       Objective:   Physical Exam AXOX3 No acute distress noted on phone call. No physical exam performed due to telemedicine appointment.       Assessment & Plan:  All symptoms improved, still is fatigued and has a hoarse voice and cough. She is to continue the Zyrtec daily and the Coricidin Hbp per package instructions. She has an updated work note that she will remain out of work till Monday Dec 24, 2020. Patient is to continue resting, increasing fluids. If she is still sick on May 16th, she is to call the clinic for further evaluation. Patient verbalizes understanding and has no questions at the end of our conversation.

## 2021-05-09 ENCOUNTER — Other Ambulatory Visit: Payer: Self-pay | Admitting: Family Medicine

## 2021-05-09 DIAGNOSIS — Z1231 Encounter for screening mammogram for malignant neoplasm of breast: Secondary | ICD-10-CM

## 2021-05-31 ENCOUNTER — Other Ambulatory Visit: Payer: Self-pay

## 2021-05-31 ENCOUNTER — Ambulatory Visit
Admission: RE | Admit: 2021-05-31 | Discharge: 2021-05-31 | Disposition: A | Discharge status: Home / Self Care | Payer: BC Managed Care – PPO | Source: Ambulatory Visit | Attending: Family Medicine | Admitting: Family Medicine

## 2021-05-31 DIAGNOSIS — Z1231 Encounter for screening mammogram for malignant neoplasm of breast: Secondary | ICD-10-CM | POA: Insufficient documentation

## 2021-07-23 ENCOUNTER — Ambulatory Visit: Payer: BC Managed Care – PPO | Admitting: Family Medicine
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
## 2021-08-02 ENCOUNTER — Ambulatory Visit (INDEPENDENT_AMBULATORY_CARE_PROVIDER_SITE_OTHER): Payer: BC Managed Care – PPO | Admitting: Family Medicine

## 2021-08-02 ENCOUNTER — Other Ambulatory Visit: Payer: Self-pay

## 2021-08-02 ENCOUNTER — Encounter: Payer: Self-pay | Admitting: Family Medicine

## 2021-08-02 ENCOUNTER — Other Ambulatory Visit (HOSPITAL_COMMUNITY)
Admission: RE | Admit: 2021-08-02 | Discharge: 2021-08-02 | Disposition: A | Discharge status: Home / Self Care | Payer: BC Managed Care – PPO | Source: Ambulatory Visit | Attending: Family Medicine | Admitting: Family Medicine

## 2021-08-02 VITALS — BP 126/86 | HR 66 | Temp 98.4°F | Resp 16 | Ht 60.0 in | Wt 192.0 lb

## 2021-08-02 DIAGNOSIS — Z13220 Encounter for screening for lipoid disorders: Secondary | ICD-10-CM | POA: Diagnosis not present

## 2021-08-02 DIAGNOSIS — J3089 Other allergic rhinitis: Secondary | ICD-10-CM

## 2021-08-02 DIAGNOSIS — G43009 Migraine without aura, not intractable, without status migrainosus: Secondary | ICD-10-CM

## 2021-08-02 DIAGNOSIS — Z79899 Other long term (current) drug therapy: Secondary | ICD-10-CM

## 2021-08-02 DIAGNOSIS — Z131 Encounter for screening for diabetes mellitus: Secondary | ICD-10-CM

## 2021-08-02 DIAGNOSIS — E669 Obesity, unspecified: Secondary | ICD-10-CM

## 2021-08-02 DIAGNOSIS — F411 Generalized anxiety disorder: Secondary | ICD-10-CM

## 2021-08-02 DIAGNOSIS — I10 Essential (primary) hypertension: Secondary | ICD-10-CM | POA: Diagnosis not present

## 2021-08-02 DIAGNOSIS — R7303 Prediabetes: Secondary | ICD-10-CM

## 2021-08-02 DIAGNOSIS — Z130 Encounter for screening for diseases of the blood and blood-forming organs and certain disorders involving the immune mechanism: Secondary | ICD-10-CM

## 2021-08-02 DIAGNOSIS — I493 Ventricular premature depolarization: Secondary | ICD-10-CM

## 2021-08-02 DIAGNOSIS — F330 Major depressive disorder, recurrent, mild: Secondary | ICD-10-CM | POA: Diagnosis not present

## 2021-08-02 DIAGNOSIS — E559 Vitamin D deficiency, unspecified: Secondary | ICD-10-CM

## 2021-08-02 DIAGNOSIS — Z113 Encounter for screening for infections with a predominantly sexual mode of transmission: Secondary | ICD-10-CM

## 2021-08-02 DIAGNOSIS — N898 Other specified noninflammatory disorders of vagina: Secondary | ICD-10-CM

## 2021-08-02 MED ORDER — BUPROPION HCL ER (XL) 150 MG PO TB24: 150.0000 mg | ORAL_TABLET | Freq: Every day | ORAL | 0 refills | Status: DC

## 2021-08-02 MED ORDER — METOPROLOL SUCCINATE ER 25 MG PO TB24
25.0000 mg | ORAL_TABLET | Freq: Every day | ORAL | 3 refills | Status: DC
Start: 2021-08-02 — End: 2022-09-01

## 2021-08-06 LAB — CERVICOVAGINAL ANCILLARY ONLY
Bacterial Vaginitis (gardnerella): POSITIVE — AB
Candida Glabrata: NEGATIVE
Candida Vaginitis: NEGATIVE
Chlamydia: NEGATIVE
Comment: NEGATIVE
Comment: NEGATIVE
Comment: NEGATIVE
Comment: NEGATIVE
Comment: NEGATIVE
Comment: NORMAL
Neisseria Gonorrhea: NEGATIVE
Trichomonas: NEGATIVE

## 2021-08-06 LAB — CBC WITH DIFFERENTIAL/PLATELET
Absolute Monocytes: 337 cells/uL (ref 200–950)
Basophils Absolute: 19 cells/uL (ref 0–200)
Basophils Relative: 0.5 %
Eosinophils Absolute: 111 cells/uL (ref 15–500)
Eosinophils Relative: 3 %
HCT: 43 % (ref 35.0–45.0)
Hemoglobin: 14.2 g/dL (ref 11.7–15.5)
Lymphs Abs: 1502 cells/uL (ref 850–3900)
MCH: 28 pg (ref 27.0–33.0)
MCHC: 33 g/dL (ref 32.0–36.0)
MCV: 84.8 fL (ref 80.0–100.0)
MPV: 10.1 fL (ref 7.5–12.5)
Monocytes Relative: 9.1 %
Neutro Abs: 1732 cells/uL (ref 1500–7800)
Neutrophils Relative %: 46.8 %
Platelets: 217 10*3/uL (ref 140–400)
RBC: 5.07 10*6/uL (ref 3.80–5.10)
RDW: 13.1 % (ref 11.0–15.0)
Total Lymphocyte: 40.6 %
WBC: 3.7 10*3/uL — ABNORMAL LOW (ref 3.8–10.8)

## 2021-08-06 LAB — COMPLETE METABOLIC PANEL WITH GFR
AG Ratio: 1.4 (calc) (ref 1.0–2.5)
ALT: 27 U/L (ref 6–29)
AST: 20 U/L (ref 10–30)
Albumin: 4.3 g/dL (ref 3.6–5.1)
Alkaline phosphatase (APISO): 38 U/L (ref 31–125)
BUN: 11 mg/dL (ref 7–25)
CO2: 24 mmol/L (ref 20–32)
Calcium: 9 mg/dL (ref 8.6–10.2)
Chloride: 105 mmol/L (ref 98–110)
Creat: 0.93 mg/dL (ref 0.50–0.99)
Globulin: 3.1 g/dL (calc) (ref 1.9–3.7)
Glucose, Bld: 96 mg/dL (ref 65–99)
Potassium: 4.1 mmol/L (ref 3.5–5.3)
Sodium: 138 mmol/L (ref 135–146)
Total Bilirubin: 0.7 mg/dL (ref 0.2–1.2)
Total Protein: 7.4 g/dL (ref 6.1–8.1)
eGFR: 79 mL/min/{1.73_m2} (ref >=60)

## 2021-08-06 LAB — HIV ANTIBODY (ROUTINE TESTING W REFLEX): HIV 1&2 Ab, 4th Generation: NONREACTIVE

## 2021-08-06 LAB — HEMOGLOBIN A1C
Hgb A1c MFr Bld: 5.5 % of total Hgb (ref <5.7)
Mean Plasma Glucose: 111 mg/dL
eAG (mmol/L): 6.2 mmol/L

## 2021-08-06 LAB — LIPID PANEL
Cholesterol: 181 mg/dL (ref <200)
HDL: 68 mg/dL (ref >=50)
LDL Cholesterol (Calc): 92 mg/dL (calc)
Non-HDL Cholesterol (Calc): 113 mg/dL (calc) (ref <130)
Total CHOL/HDL Ratio: 2.7 (calc) (ref <5.0)
Triglycerides: 114 mg/dL (ref <150)

## 2021-08-06 LAB — VITAMIN D 25 HYDROXY (VIT D DEFICIENCY, FRACTURES): Vit D, 25-Hydroxy: 29 ng/mL — ABNORMAL LOW (ref 30–100)

## 2021-08-06 LAB — RPR: RPR Ser Ql: NONREACTIVE

## 2021-08-07 ENCOUNTER — Encounter: Payer: Self-pay | Admitting: Family Medicine

## 2021-08-07 ENCOUNTER — Other Ambulatory Visit: Payer: Self-pay | Admitting: Family Medicine

## 2021-08-07 MED ORDER — METRONIDAZOLE 250 MG PO TABS: 250.0000 mg | ORAL_TABLET | Freq: Two times a day (BID) | ORAL | 0 refills | Status: AC

## 2021-10-09 DIAGNOSIS — R7309 Other abnormal glucose: Secondary | ICD-10-CM | POA: Diagnosis not present

## 2021-11-09 DIAGNOSIS — R7309 Other abnormal glucose: Secondary | ICD-10-CM | POA: Diagnosis not present

## 2021-12-09 DIAGNOSIS — R7309 Other abnormal glucose: Secondary | ICD-10-CM | POA: Diagnosis not present

## 2022-01-01 ENCOUNTER — Other Ambulatory Visit: Payer: Self-pay | Admitting: Family Medicine

## 2022-01-01 DIAGNOSIS — J3089 Other allergic rhinitis: Secondary | ICD-10-CM

## 2022-01-01 NOTE — Telephone Encounter (Signed)
Medication Refill - Medication: fluticasone (FLONASE) 50 MCG/ACT nasal spray  Has the patient contacted their pharmacy? Yes.   (Agent: If no, request that the patient contact the pharmacy for the refill. If patient does not wish to contact the pharmacy document the reason why and proceed with request.) (Agent: If yes, when and what did the pharmacy advise?)  Preferred Pharmacy (with phone number or street name):  [facility] [address], [place] - [address]  [address] [place] [address]  Phone: [phone] Fax: [phone]   Has the patient been seen for an appointment in the last year OR does the patient have an upcoming appointment? Yes.    Agent: Please be advised that RX refills may take up to 3 business days. We ask that you follow-up with your pharmacy.

## 2022-01-02 MED ORDER — FLUTICASONE PROPIONATE 50 MCG/ACT NA SUSP: 1.0000 | Freq: Two times a day (BID) | NASAL | 0 refills | Status: DC | PRN

## 2022-01-02 NOTE — Telephone Encounter (Signed)
Pt called to report that she is completely out of her current supply 

## 2022-01-02 NOTE — Telephone Encounter (Signed)
Appt schd.

## 2022-01-08 ENCOUNTER — Ambulatory Visit: Payer: BC Managed Care – PPO | Admitting: Family Medicine

## 2022-01-08 ENCOUNTER — Encounter: Payer: Self-pay | Admitting: Family Medicine

## 2022-01-08 ENCOUNTER — Other Ambulatory Visit: Payer: Self-pay | Admitting: Family Medicine

## 2022-01-08 VITALS — BP 118/70 | HR 68 | Temp 98.5°F | Resp 16 | Ht 60.0 in | Wt 201.3 lb

## 2022-01-08 DIAGNOSIS — Z30011 Encounter for initial prescription of contraceptive pills: Secondary | ICD-10-CM | POA: Diagnosis not present

## 2022-01-08 DIAGNOSIS — L750 Bromhidrosis: Secondary | ICD-10-CM | POA: Diagnosis not present

## 2022-01-08 DIAGNOSIS — Z113 Encounter for screening for infections with a predominantly sexual mode of transmission: Secondary | ICD-10-CM | POA: Diagnosis not present

## 2022-01-08 DIAGNOSIS — Z3041 Encounter for surveillance of contraceptive pills: Secondary | ICD-10-CM

## 2022-01-08 DIAGNOSIS — N898 Other specified noninflammatory disorders of vagina: Secondary | ICD-10-CM

## 2022-01-08 DIAGNOSIS — R300 Dysuria: Secondary | ICD-10-CM | POA: Diagnosis not present

## 2022-01-08 DIAGNOSIS — F320 Major depressive disorder, single episode, mild: Secondary | ICD-10-CM

## 2022-01-08 LAB — POCT URINALYSIS DIPSTICK
Bilirubin, UA: NEGATIVE
Blood, UA: NEGATIVE
Glucose, UA: NEGATIVE
Protein, UA: NEGATIVE
Spec Grav, UA: 1.02 (ref 1.010–1.025)
Urobilinogen, UA: 0.2 E.U./dL
pH, UA: 5 (ref 5.0–8.0)

## 2022-01-08 LAB — POCT URINE PREGNANCY: Preg Test, Ur: NEGATIVE

## 2022-01-08 MED ORDER — LEVONORGESTREL-ETHINYL ESTRAD 0.1-20 MG-MCG PO TABS: 1.0000 | ORAL_TABLET | Freq: Every day | ORAL | 4 refills | Status: DC

## 2022-01-08 MED ORDER — METRONIDAZOLE 500 MG PO TABS: 500.0000 mg | ORAL_TABLET | Freq: Two times a day (BID) | ORAL | 0 refills | Status: AC

## 2022-01-08 MED ORDER — FLUCONAZOLE 150 MG PO TABS: 150.0000 mg | ORAL_TABLET | Freq: Once | ORAL | 0 refills | Status: AC
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
## 2022-01-09 DIAGNOSIS — R7309 Other abnormal glucose: Secondary | ICD-10-CM | POA: Diagnosis not present

## 2022-01-09 LAB — URINE CULTURE
MICRO NUMBER:: 13465521
Result:: NO GROWTH
SPECIMEN QUALITY:: ADEQUATE

## 2022-01-09 LAB — RPR: RPR Ser Ql: NONREACTIVE

## 2022-01-09 LAB — HIV ANTIBODY (ROUTINE TESTING W REFLEX): HIV 1&2 Ab, 4th Generation: NONREACTIVE

## 2022-01-10 LAB — SPECIMEN STATUS REPORT

## 2022-01-11 LAB — GC/CHLAMYDIA PROBE AMP
Chlamydia trachomatis, NAA: NEGATIVE
Neisseria Gonorrhoeae by PCR: NEGATIVE

## 2022-01-12 LAB — BV+CT/NG/TV NAA+YEAST CULTURE: Vaginal Yeast Culture: POSITIVE — AB

## 2022-02-08 DIAGNOSIS — R7309 Other abnormal glucose: Secondary | ICD-10-CM | POA: Diagnosis not present

## 2022-02-27 NOTE — Progress Notes (Signed)
Name: Bethany [name]   MRN: [id number]    DOB: 04-Jan-1981   Date:02/28/2022       Progress Note  Subjective  Chief Complaint  Medication Refill  HPI  MDD/Anxiety: she tried medication Duloxetine, Prozac and hydroxyzine but had side effects and stopped it on her own.  She had lots of stress since August 2020. Daughter had a baby still lives at home, she works 3 jobs during [employer]. She feels like she has to provide for her grandchild. Discussed counseling    Pre-diabetes/Obesity : she gained another 10 lbs since Dec 2022. She stopped cooking, she states having kids at home that makes the house a mess. She is eating at work and the options are not very healthy. She states she knows what she needs to do but has to start doing it Not interested in taking medications at this time.    Migraine and left arm numbness:She was seeing neurologist - Dr. Sherryll Burger -  she stopped taking  magnesium but still takes MVI . Seems like Metoprolol and is doing well, seems to have improved She had MRI also evaluated by Dr. Sherryll Burger and Dr. Andee Poles and given reassurance    PVC/MVP: she saw [facility] cardiologist  and had Echo and EKG, and was released from their care back in 2022, advised to go back if worsening of symptoms She states no longer having palpitation    Echo   Normal left ventricular systolic function, ejection fraction > 55%  Mitral valve prolapse - moderate  Mitral regurgitation - mild  Normal right ventricular systolic function   Holter event monitor ( June 2020 )    Patient had a min HR of 36 bpm, max HR of 154 bpm, and avg HR of 72 bpm. Predominant underlying rhythm was Sinus Rhythm. Isolated SVEs were rare (<1.0%), SVE Couplets were rare (<1.0%), and no SVE Triplets were present. Isolated VEs were occasional (1.3%, 18369), VE Couplets were rare (<1.0%, 39), and VE Triplets were rare (<1.0%, 4). Ventricular Bigeminy and Trigeminy were present.  One patient triggered event was associated with sinus  rhythm.  Patient Active Problem List   Diagnosis Date Noted   Palpitations 08/23/2020   Mitral valve prolapse 01/11/2019   Hypertension, benign 09/14/2018   Chronic left hip pain 12/04/2017   Obesity, Class II, BMI 35-39.9 12/04/2017   Abnormal finding on MRI of brain 11/05/2017   Mild major depression (HCC) 09/11/2017   Patellofemoral stress syndrome of both knees 08/09/2014    Past Surgical History:  Procedure Laterality Date   CESAREAN SECTION  2002   CESAREAN SECTION  2005    Family History  Problem Relation Age of Onset   Emphysema Mother    Hypercalcemia Mother    Hypertension Mother    Sickle cell trait Mother    Clotting disorder Mother    Prostate cancer Father    Diabetes Mellitus II Father    Hypertension Father    Heart Problems Father        Enlarged Heart   Thyroid disease Half-Sister    Asthma Paternal Grandmother    Lung cancer Maternal Aunt    Breast cancer Maternal Aunt        early 60's   Lung cancer Maternal Uncle    Kidney disease Paternal Aunt    Diabetes Half-Sister    Seizures Half-Sister     Social History   Tobacco Use   Smoking status: Never   Smokeless tobacco: Never  Substance Use Topics  Alcohol use: Not Currently    Alcohol/week: 0.0 standard drinks of alcohol    Comment: occasionally     Current Outpatient Medications:    fexofenadine (ALLEGRA) 180 MG tablet, Take 180 mg by mouth as needed for allergies or rhinitis., Disp: , Rfl:    fluticasone (FLONASE) 50 MCG/ACT nasal spray, Place 1 spray into both nostrils 2 (two) times daily as needed for allergies or rhinitis., Disp: 16 g, Rfl: 0   levonorgestrel-ethinyl estradiol (AVIANE) 0.1-20 MG-MCG tablet, Take 1 tablet by mouth daily., Disp: 84 tablet, Rfl: 4   metoprolol succinate (TOPROL-XL) 25 MG 24 hr tablet, Take 1 tablet (25 mg total) by mouth daily., Disp: 90 tablet, Rfl: 3   Multiple Vitamin (MULTIVITAMIN WITH MINERALS) TABS tablet, Take 1 tablet by mouth daily., Disp: ,  Rfl:    Probiotic Product (UP4 PROBIOTICS WOMENS PO), Take by mouth., Disp: , Rfl:   Allergies  Allergen Reactions   Pantoprazole     Pain in lef,t arm numbness and trouble moving arm     I personally reviewed active problem list, medication list, allergies, family history, social history, health maintenance with the patient/caregiver today.   ROS  Constitutional: Negative for fever , positive for  weight change.  Respiratory: Negative for cough and shortness of breath.   Cardiovascular: Negative for chest pain or palpitations.  Gastrointestinal: Negative for abdominal pain, no bowel changes.  Musculoskeletal: Negative for gait problem or joint swelling.  Skin: Negative for rash.  Neurological: Negative for dizziness or headache.  No other specific complaints in a complete review of systems (except as listed in HPI above).   Objective  Vitals:   02/28/22 1121  BP: 134/86  Pulse: 76  Resp: 16  Weight: 202 lb (91.6 kg)  Height: 5' (1.524 m)    Body mass index is 39.45 kg/m.  Physical Exam  Constitutional: Patient appears well-developed and well-nourished. Obese  No distress.  HEENT: head atraumatic, normocephalic, pupils equal and reactive to light,, neck supple, Cardiovascular: Normal rate, regular rhythm and normal heart sounds.  No murmur heard. No BLE edema. Pulmonary/Chest: Effort normal and breath sounds normal. No respiratory distress. Abdominal: Soft.  There is no tenderness. Psychiatric: Patient has a normal mood and affect. behavior is normal. Judgment and thought content normal.   Recent Results (from the past 2160 hour(s))  GC/Chlamydia Probe Amp([facility])     Status: None   Collection Time: 01/08/22 12:00 AM  Result Value Ref Range   Chlamydia trachomatis, NAA Negative Negative   Neisseria Gonorrhoeae by PCR Negative Negative  BV+Ct/Ng/Tv NAA+Yeast Culture     Status: Abnormal   Collection Time: 01/08/22 12:00 AM  Result Value Ref Range   BV, Sialidase  Activity CANCELED     Comment: Test not performed. Type of swab received not suitable for testing requested.  Received eswab. Requires LQ Amies sponge swab. 01/10/2022-[place]  Result canceled by the ancillary.    Vaginal Yeast Culture Positive (A) Negative   Chlamydia by NAA CANCELED     Comment: Test not performed. No specimen received.  Result canceled by the ancillary.    Gonococcus by NAA CANCELED     Comment: Test not performed. No specimen received.  Result canceled by the ancillary.    Trich vag by NAA CANCELED     Comment: Test not performed. No specimen received.  Result canceled by the ancillary.   Specimen status report     Status: None (Preliminary result)   Collection Time: 01/08/22 12:00 AM  Result Value Ref Range   specimen status report Comment     Comment: No Specimen Received  HIV antibody (with reflex)     Status: None   Collection Time: 01/08/22 11:16 AM  Result Value Ref Range   HIV 1&2 Ab, 4th Generation NON-REACTIVE NON-REACTIVE    Comment: HIV-1 antigen and HIV-1/HIV-2 antibodies were not detected. There is no laboratory evidence of HIV infection. Marland Kitchen PLEASE NOTE: This information has been disclosed to you from records whose confidentiality may be protected by state law.  If your state requires such protection, then the state law prohibits you from making any further disclosure of the information without the specific written consent of the person to whom it pertains, or as otherwise permitted by law. A general authorization for the release of medical or other information is NOT sufficient for this purpose. . For additional information please refer to http://education.[facility].com/faq/FAQ106 (This link is being provided for informational/ educational purposes only.) . Marland Kitchen The performance of this assay has not been clinically validated in patients less than 4 years old. .   RPR     Status: None   Collection Time: 01/08/22 11:16 AM   Result Value Ref Range   RPR Ser Ql NON-REACTIVE NON-REACTIVE  POCT urine pregnancy     Status: Normal   Collection Time: 01/08/22 11:27 AM  Result Value Ref Range   Preg Test, Ur Negative Negative  POCT Urinalysis Dipstick     Status: Abnormal   Collection Time: 01/08/22 11:53 AM  Result Value Ref Range   Color, UA Dark Yellow    Clarity, UA cloudy    Glucose, UA Negative Negative   Bilirubin, UA Negative    Ketones, UA Small    Spec Grav, UA 1.020 1.010 - 1.025   Blood, UA Negative    pH, UA 5.0 5.0 - 8.0   Protein, UA Negative Negative   Urobilinogen, UA 0.2 0.2 or 1.0 E.U./dL   Nitrite, UA Mild    Leukocytes, UA Moderate (2+) (A) Negative   Appearance Cloudy    Odor No   Urine Culture     Status: None   Collection Time: 01/08/22 12:00 PM   Specimen: Urine  Result Value Ref Range   MICRO NUMBER: [id number]    SPECIMEN QUALITY: Adequate    Sample Source URINE    STATUS: FINAL    Result: No Growth     PHQ2/9:    02/28/2022   11:27 AM 01/08/2022   10:35 AM 08/02/2021    9:49 AM 07/18/2020   11:30 AM 04/13/2020    3:40 PM  Depression screen PHQ 2/9  Decreased Interest 0 0 1 0 0  Down, Depressed, Hopeless 0 0 0 0 1  PHQ - 2 Score 0 0 1 0 1  Altered sleeping 0 1 3 1 1   Tired, decreased energy 2 2 3  0 1  Change in appetite 3 3 0 0 0  Feeling bad or failure about yourself  0 0 0 0 0  Trouble concentrating 2 1 1  0 0  Moving slowly or fidgety/restless 0 0 0 0 0  Suicidal thoughts 0 0 0 0 0  PHQ-9 Score 7 7 8 1 3   Difficult doing work/chores  Somewhat difficult  Not difficult at all Somewhat difficult    phq 9 is positive   Fall Risk:    02/28/2022   11:20 AM 01/08/2022   10:18 AM 08/02/2021    9:49 AM 07/18/2020   11:22 AM 12/14/2019  3:41 PM  Fall Risk   Falls in the past year? 0 0 0 0 0  Number falls in past yr: 0  0 0 0  Injury with Fall? 0  0 0 0  Risk for fall due to : No Fall Risks No Fall Risks No Fall Risks    Follow up Falls prevention discussed Falls  prevention discussed Falls prevention discussed        Functional Status Survey: Is the patient deaf or have difficulty hearing?: No Does the patient have difficulty seeing, even when wearing glasses/contacts?: No Does the patient have difficulty concentrating, remembering, or making decisions?: Yes Does the patient have difficulty walking or climbing stairs?: No Does the patient have difficulty dressing or bathing?: No Does the patient have difficulty doing errands alone such as visiting a doctor's office or shopping?: No    Assessment & Plan  1. Mild major depression (HCC)  She refuses medication, discussed counseling.   2. Hypertension, benign  On Metoprolol   3. Vitamin D deficiency   4. GAD (generalized anxiety disorder)  Refuses therapy  5. PVC (premature ventricular contraction)  Doing well on Metoprolol   6. MVP (mitral valve prolapse)  Doing well  7. Migraine without aura and without status migrainosus, not intractable   8. Pre-diabetes    She needs to resume a healthier diet and exercise more

## 2022-02-28 ENCOUNTER — Encounter: Payer: Self-pay | Admitting: Family Medicine

## 2022-02-28 ENCOUNTER — Ambulatory Visit: Payer: BC Managed Care – PPO | Admitting: Family Medicine

## 2022-02-28 VITALS — BP 134/86 | HR 76 | Resp 16 | Ht 60.0 in | Wt 202.0 lb

## 2022-02-28 DIAGNOSIS — E559 Vitamin D deficiency, unspecified: Secondary | ICD-10-CM | POA: Diagnosis not present

## 2022-02-28 DIAGNOSIS — I10 Essential (primary) hypertension: Secondary | ICD-10-CM

## 2022-02-28 DIAGNOSIS — F411 Generalized anxiety disorder: Secondary | ICD-10-CM

## 2022-02-28 DIAGNOSIS — F320 Major depressive disorder, single episode, mild: Secondary | ICD-10-CM | POA: Diagnosis not present

## 2022-02-28 DIAGNOSIS — R7303 Prediabetes: Secondary | ICD-10-CM

## 2022-02-28 DIAGNOSIS — G43009 Migraine without aura, not intractable, without status migrainosus: Secondary | ICD-10-CM

## 2022-02-28 DIAGNOSIS — I341 Nonrheumatic mitral (valve) prolapse: Secondary | ICD-10-CM

## 2022-02-28 DIAGNOSIS — I493 Ventricular premature depolarization: Secondary | ICD-10-CM

## 2022-03-11 DIAGNOSIS — R7309 Other abnormal glucose: Secondary | ICD-10-CM | POA: Diagnosis not present

## 2022-04-09 ENCOUNTER — Encounter: Payer: Self-pay | Admitting: Nurse Practitioner

## 2022-04-09 ENCOUNTER — Ambulatory Visit (INDEPENDENT_AMBULATORY_CARE_PROVIDER_SITE_OTHER): Payer: Self-pay | Admitting: Nurse Practitioner

## 2022-04-09 VITALS — BP 122/80 | HR 100 | Temp 98.0°F | Ht 60.0 in | Wt 198.6 lb

## 2022-04-09 DIAGNOSIS — J069 Acute upper respiratory infection, unspecified: Secondary | ICD-10-CM

## 2022-04-09 LAB — POC COVID19 BINAXNOW: SARS Coronavirus 2 Ag: NEGATIVE

## 2022-04-09 MED ORDER — ALBUTEROL SULFATE HFA 108 (90 BASE) MCG/ACT IN AERS: 2.0000 | INHALATION_SPRAY | Freq: Four times a day (QID) | RESPIRATORY_TRACT | 0 refills | Status: DC | PRN

## 2022-04-09 NOTE — Progress Notes (Signed)
Therapist, [facility] [address] [place], [place] [address] [phone]  Office Visit Note  Patient Name: Bethany [name] Date of Birth [id number]  Medical Record number [id number]  Date of Service: 04/09/2022  Chief Complaint  Patient presents with   Sore Throat    Sneezing, coughing up dark yellow mucus and burns to cough up. Some sinus congestion, sore throat is very sore. Started 2 days ago.      HPI 41 year old female presenting to [facility] with complaints of cough, sinus congestions and sore throat for the past 2 days.   She did take Coricidin OTC for relief     Current Medication:  Outpatient Encounter Medications as of 04/09/2022  Medication Sig   fexofenadine (ALLEGRA) 180 MG tablet Take 180 mg by mouth as needed for allergies or rhinitis.   fluticasone (FLONASE) 50 MCG/ACT nasal spray Place 1 spray into both nostrils 2 (two) times daily as needed for allergies or rhinitis.   levonorgestrel-ethinyl estradiol (AVIANE) 0.1-20 MG-MCG tablet Take 1 tablet by mouth daily.   metoprolol succinate (TOPROL-XL) 25 MG 24 hr tablet Take 1 tablet (25 mg total) by mouth daily.   Multiple Vitamin (MULTIVITAMIN WITH MINERALS) TABS tablet Take 1 tablet by mouth daily.   Probiotic Product (UP4 PROBIOTICS WOMENS PO) Take by mouth.   No facility-administered encounter medications on file as of 04/09/2022.      Medical History: Past Medical History:  Diagnosis Date   Diabetes mellitus without complication (HCC)    Hypertension      Vital Signs: BP 122/80 (BP Location: Left Arm, Patient Position: Sitting, Cuff Size: Normal)   Pulse 100   Temp 98 F (36.7 C) (Tympanic)   Ht 5' (1.524 m)   Wt 198 lb 9.6 oz (90.1 kg)   SpO2 100%   BMI 38.79 kg/m    Review of Systems  Constitutional:  Positive for fatigue.  HENT:  Positive for congestion, sinus pressure and sore throat.   Respiratory:  Positive for cough.   Cardiovascular: Negative.   Genitourinary:  Negative.   Musculoskeletal: Negative.   Neurological: Negative.     Physical Exam HENT:     Head: Normocephalic.     Right Ear: Tympanic membrane and ear canal normal.     Left Ear: Tympanic membrane and ear canal normal.     Nose: Congestion present.     Mouth/Throat:     Mouth: Mucous membranes are moist.  Eyes:     Conjunctiva/sclera: Conjunctivae normal.  Cardiovascular:     Rate and Rhythm: Normal rate and regular rhythm.     Heart sounds: Normal heart sounds.  Pulmonary:     Effort: Pulmonary effort is normal.     Breath sounds: Normal breath sounds.  Musculoskeletal:     Cervical back: Normal range of motion.  Skin:    General: Skin is warm.  Neurological:     General: No focal deficit present.     Mental Status: She is alert.       Assessment/Plan: 1. Viral upper respiratory tract infection Meds ordered this encounter  Medications   albuterol (VENTOLIN HFA) 108 (90 Base) MCG/ACT inhaler    Sig: Inhale 2 puffs into the lungs every 6 (six) hours as needed for wheezing or shortness of breath.    Dispense:  8 g    Refill:  0    Continue using Coricidin HBP for relief of symptoms Increase vitamin C   General Counseling: Mikaela verbalizes  understanding of the findings of todays visit and agrees with plan of treatment. I have discussed any further diagnostic evaluation that may be needed or ordered today. We also reviewed her medications today. she has been encouraged to call the office with any questions or concerns that should arise related to todays visit.    Time spent:15 Minutes    Viviano Simas [facility] Nurse Practitioner

## 2022-04-10 ENCOUNTER — Ambulatory Visit (INDEPENDENT_AMBULATORY_CARE_PROVIDER_SITE_OTHER): Payer: Self-pay | Admitting: Nurse Practitioner

## 2022-04-10 DIAGNOSIS — J069 Acute upper respiratory infection, unspecified: Secondary | ICD-10-CM

## 2022-04-10 MED ORDER — BENZONATATE 100 MG PO CAPS: 100.0000 mg | ORAL_CAPSULE | Freq: Two times a day (BID) | ORAL | 0 refills | Status: DC | PRN

## 2022-04-10 NOTE — Progress Notes (Signed)
Telephone call with patient for update on symptoms.  Patient consents to telephone appointment with provider  States she has been using Coricidin HBP OTC and albuterol inhaler as prescribed.   Cough is disrupting sleep Mucous is getting thinner She is making a good effort to drink more water and rest  Denies fever  Would like one additional day off work to rest  Will follow up if symptoms persist or with new or worsening symptoms as discussed

## 2022-04-11 DIAGNOSIS — R7309 Other abnormal glucose: Secondary | ICD-10-CM | POA: Diagnosis not present

## 2022-05-11 DIAGNOSIS — R7309 Other abnormal glucose: Secondary | ICD-10-CM | POA: Diagnosis not present

## 2022-06-11 DIAGNOSIS — R7309 Other abnormal glucose: Secondary | ICD-10-CM | POA: Diagnosis not present

## 2022-07-06 ENCOUNTER — Encounter: Payer: Self-pay | Admitting: Family Medicine

## 2022-07-11 DIAGNOSIS — R7309 Other abnormal glucose: Secondary | ICD-10-CM | POA: Diagnosis not present

## 2022-08-06 ENCOUNTER — Telehealth (INDEPENDENT_AMBULATORY_CARE_PROVIDER_SITE_OTHER): Payer: BC Managed Care – PPO | Admitting: Nurse Practitioner

## 2022-08-06 ENCOUNTER — Encounter: Payer: Self-pay | Admitting: Family Medicine

## 2022-08-06 ENCOUNTER — Other Ambulatory Visit: Payer: Self-pay

## 2022-08-06 DIAGNOSIS — N898 Other specified noninflammatory disorders of vagina: Secondary | ICD-10-CM | POA: Diagnosis not present

## 2022-08-06 MED ORDER — METRONIDAZOLE 0.75 % VA GEL: 1.0000 | Freq: Every day | VAGINAL | 0 refills | Status: DC

## 2022-08-06 MED ORDER — FLUCONAZOLE 150 MG PO TABS: 150.0000 mg | ORAL_TABLET | ORAL | 0 refills | Status: DC | PRN

## 2022-08-06 NOTE — Progress Notes (Signed)
Name: Bethany [name]   MRN: [id number]    DOB: [birth year]   Date:08/06/2022       Progress Note  Subjective  Chief Complaint  Chief Complaint  Patient presents with   Vaginitis    Burning and itching vaginal area, discharge yellowish color for 3 days    I connected with  Cherylann Banas  on 08/06/22 at 11:00 AM EST by a video enabled telemedicine application and verified that I am speaking with the correct person using two identifiers.  I discussed the limitations of evaluation and management by telemedicine and the availability of in person appointments. The patient expressed understanding and agreed to proceed with a virtual visit  Staff also discussed with the patient that there may be a patient responsible charge related to this service. Patient Location: house Provider Location: [facility] Additional Individuals present: alone  HPI  Vaginal itching and discharge:  patient reports symptoms started three days ago.  She says she has vaginal itching  and yellow discharge. She denies any new sexual partners.  Patient reports she is very uncomfortable and does not want to wait to be treated. She is out of town and is unable to come to the office and do a vaginal swab, will treat empirically if no improvement patient will need to be seen.   Patient Active Problem List   Diagnosis Date Noted   Palpitations 08/23/2020   Mitral valve prolapse 01/11/2019   Hypertension, benign 09/14/2018   Chronic left hip pain 12/04/2017   Obesity, Class II, BMI 35-39.9 12/04/2017   Abnormal finding on MRI of brain 11/05/2017   Mild major depression (HCC) 09/11/2017   Patellofemoral stress syndrome of both knees 08/09/2014    Social History   Tobacco Use   Smoking status: Never   Smokeless tobacco: Never  Substance Use Topics   Alcohol use: Not Currently    Alcohol/week: 0.0 standard drinks of alcohol    Comment: occasionally     Current Outpatient Medications:    albuterol (VENTOLIN HFA) 108  (90 Base) MCG/ACT inhaler, Inhale 2 puffs into the lungs every 6 (six) hours as needed for wheezing or shortness of breath., Disp: 8 g, Rfl: 0   benzonatate (TESSALON) 100 MG capsule, Take 1 capsule (100 mg total) by mouth 2 (two) times daily as needed for cough., Disp: 20 capsule, Rfl: 0   fexofenadine (ALLEGRA) 180 MG tablet, Take 180 mg by mouth as needed for allergies or rhinitis., Disp: , Rfl:    fluticasone (FLONASE) 50 MCG/ACT nasal spray, Place 1 spray into both nostrils 2 (two) times daily as needed for allergies or rhinitis., Disp: 16 g, Rfl: 0   levonorgestrel-ethinyl estradiol (AVIANE) 0.1-20 MG-MCG tablet, Take 1 tablet by mouth daily., Disp: 84 tablet, Rfl: 4   metoprolol succinate (TOPROL-XL) 25 MG 24 hr tablet, Take 1 tablet (25 mg total) by mouth daily., Disp: 90 tablet, Rfl: 3   Multiple Vitamin (MULTIVITAMIN WITH MINERALS) TABS tablet, Take 1 tablet by mouth daily., Disp: , Rfl:    Probiotic Product (UP4 PROBIOTICS WOMENS PO), Take by mouth., Disp: , Rfl:   Allergies  Allergen Reactions   Pantoprazole     Pain in lef,t arm numbness and trouble moving arm     I personally reviewed active problem list, medication list, allergies, notes from last encounter with the patient/caregiver today.  ROS  Constitutional: Negative for fever or weight change.  Respiratory: Negative for cough and shortness of breath.   Cardiovascular: Negative for chest pain  or palpitations.  Gastrointestinal: Negative for abdominal pain, no bowel changes.  GU: positive for vaginal itching and discharge Musculoskeletal: Negative for gait problem or joint swelling.  Skin: Negative for rash.  Neurological: Negative for dizziness or headache.  No other specific complaints in a complete review of systems (except as listed in HPI above).   Objective  Virtual encounter, vitals not obtained.  There is no height or weight on file to calculate BMI.  Nursing Note and Vital Signs reviewed.  Physical  Exam  Awake, alert and oriented, speaking in complete sentences  No results found for this or any previous visit (from the past 72 hour(s)).  Assessment & Plan  1. Vaginal discharge Start treatment if no improvement seek appointment when you are back in town for vaginal swab - fluconazole (DIFLUCAN) 150 MG tablet; Take 1 tablet (150 mg total) by mouth every 3 (three) days as needed (for vaginal itching/yeast infection sx).  Dispense: 2 tablet; Refill: 0 - metroNIDAZOLE (METROGEL) 0.75 % vaginal gel; Place 1 Applicatorful vaginally at bedtime.  Dispense: 70 g; Refill: 0  2. Vaginal itching Start treatment if no improvement seek appointment when you are back in town for vaginal swab - fluconazole (DIFLUCAN) 150 MG tablet; Take 1 tablet (150 mg total) by mouth every 3 (three) days as needed (for vaginal itching/yeast infection sx).  Dispense: 2 tablet; Refill: 0 - metroNIDAZOLE (METROGEL) 0.75 % vaginal gel; Place 1 Applicatorful vaginally at bedtime.  Dispense: 70 g; Refill: 0    -Red flags and when to present for emergency care or RTC including fever >101.1F, chest pain, shortness of breath, new/worsening/un-resolving symptoms,  reviewed with patient at time of visit. Follow up and care instructions discussed and provided in AVS. - I discussed the assessment and treatment plan with the patient. The patient was provided an opportunity to ask questions and all were answered. The patient agreed with the plan and demonstrated an understanding of the instructions.  I provided 15 minutes of non-face-to-face time during this encounter.  Berniece Salines, FNP

## 2022-08-11 DIAGNOSIS — R7309 Other abnormal glucose: Secondary | ICD-10-CM | POA: Diagnosis not present

## 2022-08-29 NOTE — Progress Notes (Signed)
Name: Bethany [name]   MRN: [id number]    DOB: 03/20/1981   Date:09/01/2022       Progress Note  Subjective  Chief Complaint  Follow Up  HPI  MDD/Anxiety: she tried medication Duloxetine, Prozac and hydroxyzine but had side effects and stopped it on her own.  Over the past year she lost her half sister died of SUDEP and also lost a friend and a nephew on the same day last year. She states her uncle recently died 01-Jun-2023 ) , [employer] is very high, she states not grieving properly and will see a therapist today . Not ready to see a psychiatrist yet    Vaginal discharge: she had a virtual visit with Almyra Free in Dec, was treated with diflucan and metrogel but continues to have symptoms described as jelly like vaginal discharge - yellowish in color, and odor. We will check before we treat. She has been with the same sexual partner for the past year. No vaginal itching . She uses condoms but not every time. Last intercourse was a few months ago, no longer together   Pre-diabetes/Obesity : She has not been physically active lately, weight is stable, we will recheck labs, denies polyphagia, polydipsia or polyuria.    Migraine and left arm numbness:She was seeing neurologist - Dr. Manuella Ghazi -  she stopped taking magnesium but still takes MVI . Seems like Metoprolol and is doing well, seems to have improved  MRI normal in the past. Doing well at this time    PVC/MVP/HTN: she saw [facility] cardiologist  and had Echo and EKG, and was released from their care back in 2022, she has been skipping doses of metoprolol and bp is going up, also noticing intermittent left side chest pain that goes to left arm. Sometimes has SOB    Echo   Normal left ventricular systolic function, ejection fraction > 55%  Mitral valve prolapse - moderate  Mitral regurgitation - mild  Normal right ventricular systolic function   Holter event monitor ( June 2020 )    Patient had a min HR of 36 bpm, max HR of 154 bpm, and avg HR of  72 bpm. Predominant underlying rhythm was Sinus Rhythm. Isolated SVEs were rare (<1.0%), SVE Couplets were rare (<1.0%), and no SVE Triplets were present. Isolated VEs were occasional (1.3%, 18369), VE Couplets were rare (<1.0%, 39), and VE Triplets were rare (<1.0%, 4). Ventricular Bigeminy and Trigeminy were present.  One patient triggered event was associated with sinus rhythm.  Patient Active Problem List   Diagnosis Date Noted   Palpitations 08/23/2020   Mitral valve prolapse 01/11/2019   Hypertension, benign 09/14/2018   Chronic left hip pain 12/04/2017   Obesity, Class II, BMI 35-39.9 12/04/2017   Abnormal finding on MRI of brain 11/05/2017   Mild major depression ([place]) 09/11/2017   Patellofemoral stress syndrome of both knees 08/09/2014    Past Surgical History:  Procedure Laterality Date   CESAREAN SECTION  2002   CESAREAN SECTION  2005    Family History  Problem Relation Age of Onset   Emphysema Mother    Hypercalcemia Mother    Hypertension Mother    Sickle cell trait Mother    Clotting disorder Mother    Prostate cancer Father    Diabetes Mellitus II Father    Hypertension Father    Heart Problems Father        Enlarged Heart   Stroke Maternal Grandmother    Heart failure Maternal Grandmother  Emphysema Maternal Grandfather    Lung cancer Maternal Aunt    Breast cancer Maternal Aunt        early 60's   Lung cancer Maternal Uncle    Kidney disease Paternal Aunt    Seizures Half-Sister    Thyroid disease Half-Sister     Social History   Tobacco Use   Smoking status: Never   Smokeless tobacco: Never  Substance Use Topics   Alcohol use: Not Currently    Alcohol/week: 0.0 standard drinks of alcohol    Comment: occasionally     Current Outpatient Medications:    fexofenadine (ALLEGRA) 180 MG tablet, Take 180 mg by mouth as needed for allergies or rhinitis., Disp: , Rfl:    fluticasone (FLONASE) 50 MCG/ACT nasal spray, Place 1 spray into both  nostrils 2 (two) times daily as needed for allergies or rhinitis., Disp: 16 g, Rfl: 0   levonorgestrel-ethinyl estradiol (AVIANE) 0.1-20 MG-MCG tablet, Take 1 tablet by mouth daily., Disp: 84 tablet, Rfl: 4   metoprolol succinate (TOPROL-XL) 25 MG 24 hr tablet, Take 1 tablet (25 mg total) by mouth daily., Disp: 90 tablet, Rfl: 3   Multiple Vitamin (MULTIVITAMIN WITH MINERALS) TABS tablet, Take 1 tablet by mouth daily., Disp: , Rfl:    Probiotic Product (UP4 PROBIOTICS WOMENS PO), Take by mouth., Disp: , Rfl:    albuterol (VENTOLIN HFA) 108 (90 Base) MCG/ACT inhaler, Inhale 2 puffs into the lungs every 6 (six) hours as needed for wheezing or shortness of breath. (Patient not taking: Reported on 09/01/2022), Disp: 8 g, Rfl: 0   benzonatate (TESSALON) 100 MG capsule, Take 1 capsule (100 mg total) by mouth 2 (two) times daily as needed for cough. (Patient not taking: Reported on 09/01/2022), Disp: 20 capsule, Rfl: 0   fluconazole (DIFLUCAN) 150 MG tablet, Take 1 tablet (150 mg total) by mouth every 3 (three) days as needed (for vaginal itching/yeast infection sx). (Patient not taking: Reported on 09/01/2022), Disp: 2 tablet, Rfl: 0   metroNIDAZOLE (METROGEL) 0.75 % vaginal gel, Place 1 Applicatorful vaginally at bedtime. (Patient not taking: Reported on 09/01/2022), Disp: 70 g, Rfl: 0  Allergies  Allergen Reactions   Pantoprazole     Pain in lef,t arm numbness and trouble moving arm     I personally reviewed active problem list, medication list, allergies, family history, social history, health maintenance with the patient/caregiver today.   ROS  Constitutional: Negative for fever or weight change.  Respiratory: Negative for cough and shortness of breath.   Cardiovascular: Negative for chest pain or palpitations.  Gastrointestinal: Negative for abdominal pain, no bowel changes.  Musculoskeletal: Negative for gait problem or joint swelling.  Skin: Negative for rash.  Neurological: Negative for  dizziness or headache.  No other specific complaints in a complete review of systems (except as listed in HPI above).   Objective  Vitals:   09/01/22 0954  BP: (!) 132/92  Pulse: 90  Resp: 16  Temp: 98.8 F (37.1 C)  TempSrc: Oral  SpO2: 99%  Weight: 202 lb 4.8 oz (91.8 kg)  Height: 5' (1.524 m)    Body mass index is 39.51 kg/m.  Physical Exam  Constitutional: Patient appears well-developed and well-nourished. Obese  No distress.  HEENT: head atraumatic, normocephalic, pupils equal and reactive to light, ears , neck supple, throat within normal limits Cardiovascular: Normal rate, regular rhythm and normal heart sounds.  No murmur heard. No BLE edema. Pulmonary/Chest: Effort normal and breath sounds normal. No respiratory distress. Abdominal:  Soft.  There is no tenderness. Psychiatric: Patient has a normal mood and affect. behavior is normal. Judgment and thought content normal.   Recent Results (from the past 2160 hour(s))  POCT Urinalysis Dip Manual     Status: Abnormal   Collection Time: 09/01/22  9:57 AM  Result Value Ref Range   Spec Grav, UA 1.020 1.010 - 1.025   pH, UA 5.0 5.0 - 8.0   Leukocytes, UA Moderate (2+) (A) Negative   Nitrite, UA Negative Negative   Poct Protein trace Negative, trace mg/dL   Poct Glucose Normal Normal mg/dL   Poct Ketones + small (A) Negative   Poct Urobilinogen Normal Normal mg/dL   Poct Bilirubin + (A) Negative   Poct Blood Negative Negative, trace     PHQ2/9:    09/01/2022    9:53 AM 08/06/2022    9:47 AM 02/28/2022   11:27 AM 01/08/2022   10:35 AM 08/02/2021    9:49 AM  Depression screen PHQ 2/9  Decreased Interest 1 0 0 0 1  Down, Depressed, Hopeless 1 0 0 0 0  PHQ - 2 Score 2 0 0 0 1  Altered sleeping 2  0 1 3  Tired, decreased energy 3  2 2 3   Change in appetite 3  3 3  0  Feeling bad or failure about yourself  0  0 0 0  Trouble concentrating 1  2 1 1   Moving slowly or fidgety/restless 1  0 0 0  Suicidal thoughts 0   0 0 0  PHQ-9 Score 12  7 7 8   Difficult doing work/chores Somewhat difficult   Somewhat difficult     phq 9 is positive   Fall Risk:    08/06/2022    9:47 AM 02/28/2022   11:20 AM 01/08/2022   10:18 AM 08/02/2021    9:49 AM 07/18/2020   11:22 AM  Fall Risk   Falls in the past year? 0 0 0 0 0  Number falls in past yr: 0 0  0 0  Injury with Fall? 0 0  0 0  Risk for fall due to :  No Fall Risks No Fall Risks No Fall Risks   Follow up Falls evaluation completed Falls prevention discussed Falls prevention discussed Falls prevention discussed       Functional Status Survey: Is the patient deaf or have difficulty hearing?: No Does the patient have difficulty seeing, even when wearing glasses/contacts?: No Does the patient have difficulty concentrating, remembering, or making decisions?: No Does the patient have difficulty walking or climbing stairs?: No Does the patient have difficulty dressing or bathing?: No Does the patient have difficulty doing errands alone such as visiting a doctor's office or shopping?: No    Assessment & Plan  1. Major depression, recurrent, chronic (HCC)  She will see a therapist today, discussed medication but not interested   2. Hypertension, benign  - COMPLETE METABOLIC PANEL WITH GFR - CBC with Differential/Platelet - metoprolol succinate (TOPROL-XL) 50 MG 24 hr tablet; Take 1 tablet (50 mg total) by mouth daily.  Dispense: 90 tablet; Refill: 0  3. Pre-diabetes  - Hemoglobin A1c  4. Vitamin D deficiency  - VITAMIN D 25 Hydroxy (Vit-D Deficiency, Fractures)  5. GAD (generalized anxiety disorder)  Going to see therapist today   6. MVP (mitral valve prolapse)   7. Dysuria  - POCT Urinalysis Dip Manual - Urine Culture  8. Migraine without aura and without status migrainosus, not intractable  Doing well  9. Perennial allergic rhinitis   10. PVC (premature ventricular contraction)  - metoprolol succinate (TOPROL-XL) 50 MG 24  hr tablet; Take 1 tablet (50 mg total) by mouth daily.  Dispense: 90 tablet; Refill: 0  11. Other fatigue  - B12 and Folate Panel  12. Routine screening for STI (sexually transmitted infection)  - RPR - HIV Antibody (routine testing w rflx)  13. Lipid screening  - Lipid panel  14. Family history of thyroid disease  - TSH

## 2022-09-01 ENCOUNTER — Other Ambulatory Visit (HOSPITAL_COMMUNITY)
Admission: RE | Admit: 2022-09-01 | Discharge: 2022-09-01 | Disposition: A | Discharge status: Home / Self Care | Payer: BC Managed Care – PPO | Source: Ambulatory Visit | Attending: Family Medicine | Admitting: Family Medicine

## 2022-09-01 ENCOUNTER — Ambulatory Visit (INDEPENDENT_AMBULATORY_CARE_PROVIDER_SITE_OTHER): Payer: BC Managed Care – PPO | Admitting: Family Medicine

## 2022-09-01 ENCOUNTER — Encounter: Payer: Self-pay | Admitting: Family Medicine

## 2022-09-01 VITALS — BP 132/92 | HR 90 | Temp 98.8°F | Resp 16 | Ht 60.0 in | Wt 202.3 lb

## 2022-09-01 DIAGNOSIS — Z8349 Family history of other endocrine, nutritional and metabolic diseases: Secondary | ICD-10-CM

## 2022-09-01 DIAGNOSIS — R5383 Other fatigue: Secondary | ICD-10-CM

## 2022-09-01 DIAGNOSIS — R300 Dysuria: Secondary | ICD-10-CM | POA: Diagnosis not present

## 2022-09-01 DIAGNOSIS — I493 Ventricular premature depolarization: Secondary | ICD-10-CM

## 2022-09-01 DIAGNOSIS — I341 Nonrheumatic mitral (valve) prolapse: Secondary | ICD-10-CM

## 2022-09-01 DIAGNOSIS — E559 Vitamin D deficiency, unspecified: Secondary | ICD-10-CM

## 2022-09-01 DIAGNOSIS — Z113 Encounter for screening for infections with a predominantly sexual mode of transmission: Secondary | ICD-10-CM | POA: Diagnosis not present

## 2022-09-01 DIAGNOSIS — Z13220 Encounter for screening for lipoid disorders: Secondary | ICD-10-CM

## 2022-09-01 DIAGNOSIS — J3089 Other allergic rhinitis: Secondary | ICD-10-CM

## 2022-09-01 DIAGNOSIS — G43009 Migraine without aura, not intractable, without status migrainosus: Secondary | ICD-10-CM

## 2022-09-01 DIAGNOSIS — I10 Essential (primary) hypertension: Secondary | ICD-10-CM | POA: Diagnosis not present

## 2022-09-01 DIAGNOSIS — R7303 Prediabetes: Secondary | ICD-10-CM

## 2022-09-01 DIAGNOSIS — F339 Major depressive disorder, recurrent, unspecified: Secondary | ICD-10-CM | POA: Diagnosis not present

## 2022-09-01 DIAGNOSIS — F411 Generalized anxiety disorder: Secondary | ICD-10-CM

## 2022-09-01 LAB — POCT URINALYSIS DIPSTICK (MANUAL)
Nitrite, UA: NEGATIVE
Poct Blood: NEGATIVE
Poct Glucose: NORMAL mg/dL
Poct Urobilinogen: NORMAL mg/dL
Spec Grav, UA: 1.02 (ref 1.010–1.025)
pH, UA: 5 (ref 5.0–8.0)

## 2022-09-01 MED ORDER — METOPROLOL SUCCINATE ER 50 MG PO TB24: 50.0000 mg | ORAL_TABLET | Freq: Every day | ORAL | 0 refills | Status: DC

## 2022-09-01 MED ORDER — METOPROLOL SUCCINATE ER 25 MG PO TB24: 25.0000 mg | ORAL_TABLET | Freq: Every day | ORAL | 3 refills | Status: DC

## 2022-09-01 NOTE — Patient Instructions (Addendum)
Please contact [facility] cardiologist [phone]

## 2022-09-01 NOTE — Addendum Note (Signed)
Addended by: Royal Hawthorn on: 09/01/2022 10:26 AM   Modules accepted: Orders

## 2022-09-02 ENCOUNTER — Other Ambulatory Visit: Payer: Self-pay | Admitting: Family Medicine

## 2022-09-02 LAB — COMPLETE METABOLIC PANEL WITH GFR
AG Ratio: 1.3 (calc) (ref 1.0–2.5)
ALT: 19 U/L (ref 6–29)
AST: 17 U/L (ref 10–30)
Albumin: 4.4 g/dL (ref 3.6–5.1)
Alkaline phosphatase (APISO): 50 U/L (ref 31–125)
BUN: 10 mg/dL (ref 7–25)
CO2: 23 mmol/L (ref 20–32)
Calcium: 9.3 mg/dL (ref 8.6–10.2)
Chloride: 104 mmol/L (ref 98–110)
Creat: 0.94 mg/dL (ref 0.50–0.99)
Globulin: 3.4 g/dL (calc) (ref 1.9–3.7)
Glucose, Bld: 92 mg/dL (ref 65–99)
Potassium: 4.1 mmol/L (ref 3.5–5.3)
Sodium: 138 mmol/L (ref 135–146)
Total Bilirubin: 0.4 mg/dL (ref 0.2–1.2)
Total Protein: 7.8 g/dL (ref 6.1–8.1)
eGFR: 77 mL/min/{1.73_m2} (ref >=60)

## 2022-09-02 LAB — CERVICOVAGINAL ANCILLARY ONLY
Bacterial Vaginitis (gardnerella): POSITIVE — AB
Candida Glabrata: NEGATIVE
Candida Vaginitis: NEGATIVE
Chlamydia: NEGATIVE
Comment: NEGATIVE
Comment: NEGATIVE
Comment: NEGATIVE
Comment: NEGATIVE
Comment: NEGATIVE
Comment: NORMAL
Neisseria Gonorrhea: NEGATIVE
Trichomonas: POSITIVE — AB

## 2022-09-02 LAB — LIPID PANEL
Cholesterol: 168 mg/dL (ref <200)
HDL: 57 mg/dL (ref >=50)
LDL Cholesterol (Calc): 88 mg/dL (calc)
Non-HDL Cholesterol (Calc): 111 mg/dL (calc) (ref <130)
Total CHOL/HDL Ratio: 2.9 (calc) (ref <5.0)
Triglycerides: 125 mg/dL (ref <150)

## 2022-09-02 LAB — HIV ANTIBODY (ROUTINE TESTING W REFLEX): HIV 1&2 Ab, 4th Generation: NONREACTIVE

## 2022-09-02 LAB — CBC WITH DIFFERENTIAL/PLATELET
Absolute Monocytes: 410 cells/uL (ref 200–950)
Basophils Absolute: 20 cells/uL (ref 0–200)
Basophils Relative: 0.4 %
Eosinophils Absolute: 100 cells/uL (ref 15–500)
Eosinophils Relative: 2 %
HCT: 41.2 % (ref 35.0–45.0)
Hemoglobin: 13.7 g/dL (ref 11.7–15.5)
Lymphs Abs: 1560 cells/uL (ref 850–3900)
MCH: 27.7 pg (ref 27.0–33.0)
MCHC: 33.3 g/dL (ref 32.0–36.0)
MCV: 83.2 fL (ref 80.0–100.0)
MPV: 9.9 fL (ref 7.5–12.5)
Monocytes Relative: 8.2 %
Neutro Abs: 2910 cells/uL (ref 1500–7800)
Neutrophils Relative %: 58.2 %
Platelets: 269 10*3/uL (ref 140–400)
RBC: 4.95 10*6/uL (ref 3.80–5.10)
RDW: 13.1 % (ref 11.0–15.0)
Total Lymphocyte: 31.2 %
WBC: 5 10*3/uL (ref 3.8–10.8)

## 2022-09-02 LAB — TSH: TSH: 1.36 mIU/L

## 2022-09-02 LAB — B12 AND FOLATE PANEL
Folate: >24 ng/mL
Vitamin B-12: 764 pg/mL (ref 200–1100)

## 2022-09-02 LAB — URINE CULTURE
MICRO NUMBER:: 14456537
SPECIMEN QUALITY:: ADEQUATE

## 2022-09-02 LAB — HEMOGLOBIN A1C
Hgb A1c MFr Bld: 5.8 % of total Hgb — ABNORMAL HIGH (ref <5.7)
Mean Plasma Glucose: 120 mg/dL
eAG (mmol/L): 6.6 mmol/L

## 2022-09-02 LAB — RPR: RPR Ser Ql: NONREACTIVE

## 2022-09-02 LAB — VITAMIN D 25 HYDROXY (VIT D DEFICIENCY, FRACTURES): Vit D, 25-Hydroxy: 31 ng/mL (ref 30–100)

## 2022-09-02 MED ORDER — METRONIDAZOLE 500 MG PO TABS: 500.0000 mg | ORAL_TABLET | Freq: Two times a day (BID) | ORAL | 0 refills | Status: AC

## 2022-09-03 ENCOUNTER — Encounter: Payer: Self-pay | Admitting: Family Medicine

## 2022-09-11 DIAGNOSIS — R7309 Other abnormal glucose: Secondary | ICD-10-CM | POA: Diagnosis not present

## 2022-09-17 LAB — HM DIABETES EYE EXAM

## 2022-09-26 NOTE — Progress Notes (Unsigned)
Name: Bethany [name]   MRN: [id number]    DOB: October 19, 1980   Date:09/29/2022       Progress Note  Subjective  Chief Complaint  Annual Exam  HPI  Patient presents for annual CPE.  Diet: not balanced , she works two jobs and has to take grandson to daycare Exercise:  none due to lack of time  Last Eye Exam: up to date  Last Dental Exam: due for an appointment  [place] Office Visit from 09/29/2022 in [facility]  AUDIT-C Score 1      Depression: Phq 9 is  positive    09/29/2022   11:32 AM 09/01/2022    9:53 AM 08/06/2022    9:47 AM 02/28/2022   11:27 AM 01/08/2022   10:35 AM  Depression screen PHQ 2/9  Decreased Interest 1 1 0 0 0  Down, Depressed, Hopeless 1 1 0 0 0  PHQ - 2 Score 2 2 0 0 0  Altered sleeping 1 2  0 1  Tired, decreased energy 1 3  2 2  $ Change in appetite 1 3  3 3  $ Feeling bad or failure about yourself  0 0  0 0  Trouble concentrating 1 1  2 1  $ Moving slowly or fidgety/restless 0 1  0 0  Suicidal thoughts 0 0  0 0  PHQ-9 Score 6 12  7 7  $ Difficult doing work/chores Somewhat difficult Somewhat difficult   Somewhat difficult   Hypertension: BP Readings from Last 3 Encounters:  09/29/22 126/84  09/01/22 (!) 132/92  04/09/22 122/80   Obesity: Wt Readings from Last 3 Encounters:  09/29/22 199 lb 4.8 oz (90.4 kg)  09/01/22 202 lb 4.8 oz (91.8 kg)  04/09/22 198 lb 9.6 oz (90.1 kg)   BMI Readings from Last 3 Encounters:  09/29/22 38.92 kg/m  09/01/22 39.51 kg/m  04/09/22 38.79 kg/m     Vaccines:   HPV: N/A Tdap: up to date Shingrix: N/A Pneumonia: N/A Flu: refused  COVID-19: refused    Hep C Screening: 09/11/17 STD testing and prevention (HIV/chl/gon/syphilis): 09/01/22 Intimate partner violence: negative screen  Sexual History : one sexual partner, she had trichomonas and both were treated recently, no vagina discharge at this time  Menstrual History/LMP/Abnormal Bleeding: cycles are regular, light, LMP  09/15/2022 Discussed importance of follow up if any post-menopausal bleeding: N/A Incontinence Symptoms: mild stress incontinence, discussed kegel exercises  Breast cancer:  - Last Mammogram: 2022  - BRCA gene screening: N/A   Osteoporosis Prevention : Discussed high calcium and vitamin D supplementation, weight bearing exercises Bone density: N/A   Cervical cancer screening: 04/13/20, due this year- ordered  Skin cancer: Discussed monitoring for atypical lesions  Colorectal cancer: N/A   Lung cancer:  Low Dose CT Chest recommended if Age 60-80 years, 20 pack-year currently smoking OR have quit w/in 15years. Patient does not qualify for screen   ECG: 10/12/18  Advanced Care Planning: A voluntary discussion about advance care planning including the explanation and discussion of advance directives.  Discussed health care proxy and Living will, and the patient was able to identify a health care proxy as sister - Marketing executive .  Patient does not have a living will and power of attorney of health care   Lipids: Lab Results  Component Value Date   CHOL 168 09/01/2022   CHOL 181 08/02/2021   CHOL 155 11/16/2019   Lab Results  Component Value Date   HDL 57 09/01/2022  HDL 68 08/02/2021   HDL 63 11/16/2019   Lab Results  Component Value Date   LDLCALC 88 09/01/2022   LDLCALC 92 08/02/2021   LDLCALC 78 11/16/2019   Lab Results  Component Value Date   TRIG 125 09/01/2022   TRIG 114 08/02/2021   TRIG 63 11/16/2019   Lab Results  Component Value Date   CHOLHDL 2.9 09/01/2022   CHOLHDL 2.7 08/02/2021   CHOLHDL 2.5 11/16/2019   No results found for: "LDLDIRECT"  Glucose: Glucose  Date Value Ref Range Status  12/13/2011 78 65 - 99 mg/dL Final   Glucose, Bld  Date Value Ref Range Status  09/01/2022 92 65 - 99 mg/dL Final    Comment:    .            Fasting reference interval .   08/02/2021 96 65 - 99 mg/dL Final    Comment:    .            Fasting reference  interval .   11/16/2019 90 65 - 99 mg/dL Final    Comment:    .            Fasting reference interval .     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   Patellofemoral stress syndrome of both knees 08/09/2014    Past Surgical History:  Procedure Laterality Date   CESAREAN SECTION  2002   CESAREAN SECTION  2005    Family History  Problem Relation Age of Onset   Emphysema Mother    Hypercalcemia Mother    Hypertension Mother    Sickle cell trait Mother    Clotting disorder Mother    Prostate cancer Father    Diabetes Mellitus II Father    Hypertension Father    Heart Problems Father        Enlarged Heart   Stroke Maternal Grandmother    Heart failure Maternal Grandmother    Emphysema Maternal Grandfather    Lung cancer Maternal Aunt    Breast cancer Maternal Aunt        early 60's   Lung cancer Maternal Uncle    Kidney disease Paternal Aunt    Seizures Half-Sister    Thyroid disease Half-Sister     Social History   Socioeconomic History   Marital status: Married    Spouse name: Legrand Como    Number of children: 2   Years of education: Not on file   Highest education level: Some college, no degree  Occupational History   Occupation: food service    Comment: at [employer] Use   Smoking status: Never   Smokeless tobacco: Never  Vaping Use   Vaping Use: Never used  Substance and Sexual Activity   Alcohol use: Not Currently    Alcohol/week: 0.0 standard drinks of alcohol    Comment: occasionally   Drug use: No   Sexual activity: Yes    Partners: Male    Birth control/protection: Pill  Other Topics Concern   Not on file  Social History Narrative   Separated , has two  children at home, her relatives live [place], she will move back to [place] in May 2021  Works at [employer]    She moved to [place] for a safer place to raise her children    Her son is homosexual and has been bullied    Daughter has depression    Social Determinants of Radio broadcast assistant Strain: Medium Risk (09/29/2022)   Overall Financial Resource Strain (CARDIA)    Difficulty of Paying Living Expenses: Somewhat hard  Food Insecurity: Food Insecurity Present (09/29/2022)   Hunger Vital Sign    Worried About [place] in the Last Year: Often true    Ran Out of Food in the Last Year: Sometimes true  Transportation Needs: No Transportation Needs (09/29/2022)   PRAPARE - Hydrologist (Medical): No    Lack of Transportation (Non-Medical): No  Physical Activity: Insufficiently Active (09/29/2022)   Exercise Vital Sign    Days of Exercise per Week: 1 day    Minutes of Exercise per Session: 20 min  Stress: Stress Concern Present (09/29/2022)   [place]    Feeling of Stress : To some extent  Social Connections: Moderately Isolated (09/29/2022)   Social Connection and Isolation Panel [NHANES]    Frequency of Communication with Friends and Family: More than three times a week    Frequency of Social Gatherings with Friends and Family: More than three times a week    Attends Religious Services: More than 4 times per year    Active Member of [employer] or Organizations: No    Attends Archivist Meetings: Never    Marital Status: Separated  Intimate Partner Violence: Not At Risk (09/29/2022)   Humiliation, Afraid, Rape, and Kick questionnaire    Fear of Current or Ex-Partner: No    Emotionally Abused: No    Physically Abused: No    Sexually Abused: No     Current Outpatient Medications:    fexofenadine (ALLEGRA) 180 MG tablet, Take 180 mg by mouth as needed for  allergies or rhinitis., Disp: , Rfl:    fluticasone (FLONASE) 50 MCG/ACT nasal spray, Place 1 spray into both nostrils 2 (two) times daily as needed for allergies or rhinitis., Disp: 16 g, Rfl: 0   metoprolol succinate (TOPROL-XL) 50 MG 24 hr tablet, Take 1 tablet (50 mg total) by mouth daily., Disp: 90 tablet, Rfl: 0   Multiple Vitamin (MULTIVITAMIN WITH MINERALS) TABS tablet, Take 1 tablet by mouth daily., Disp: , Rfl:    Probiotic Product (UP4 PROBIOTICS WOMENS PO), Take by mouth., Disp: , Rfl:   Allergies  Allergen Reactions   Pantoprazole     Pain in lef,t arm numbness and trouble moving arm      ROS  Constitutional: Negative for fever or weight change.  Respiratory: Negative for cough and shortness of breath.   Cardiovascular: Negative for chest pain or palpitations.  Gastrointestinal: Negative for abdominal pain, no bowel changes.  Musculoskeletal: Negative for gait problem or joint swelling.  Skin: Negative for rash.  Neurological: Negative for dizziness or headache.  No other specific complaints in a complete review of systems (except as listed in HPI above).   Objective  Vitals:   09/29/22 1131  BP: 126/84  Pulse: 72  Resp: 16  Temp: 97.9 F (36.6 C)  TempSrc: Oral  SpO2: 99%  Weight: 199 lb 4.8 oz (90.4 kg)  Height: 5' (1.524 m)    Body mass index is 38.92 kg/m.  Physical Exam  Constitutional: Patient appears well-developed and  well-nourished. No distress.  HENT: Head: Normocephalic and atraumatic. Ears: B TMs ok, no erythema or effusion; Nose: Nose normal. Mouth/Throat: Oropharynx is clear and moist. No oropharyngeal exudate.  Eyes: Conjunctivae and EOM are normal. Pupils are equal, round, and reactive to light. No scleral icterus.  Neck: Normal range of motion. Neck supple. No JVD present. No thyromegaly present.  Cardiovascular: Normal rate, regular rhythm and normal heart sounds.  No murmur heard. No BLE edema. Pulmonary/Chest: Effort normal and breath  sounds normal. No respiratory distress. Abdominal: Soft. Bowel sounds are normal, no distension. There is no tenderness. no masses Breast: no lumps or masses, no nipple discharge or rashes FEMALE GENITALIA:  External genitalia normal External urethra normal Vaginal vault normal without discharge or lesions Cervix normal without discharge or lesions Bimanual exam normal without masses RECTAL: no rectal masses or hemorrhoids Musculoskeletal: Normal range of motion, no joint effusions. No gross deformities Neurological: he is alert and oriented to person, place, and time. No cranial nerve deficit. Coordination, balance, strength, speech and gait are normal.  Skin: Skin is warm and dry. No rash noted. No erythema.  Psychiatric: Patient has a normal mood and affect. behavior is normal. Judgment and thought content normal.   Recent Results (from the past 2160 hour(s))  POCT Urinalysis Dip Manual     Status: Abnormal   Collection Time: 09/01/22  9:57 AM  Result Value Ref Range   Spec Grav, UA 1.020 1.010 - 1.025   pH, UA 5.0 5.0 - 8.0   Leukocytes, UA Moderate (2+) (A) Negative   Nitrite, UA Negative Negative   Poct Protein trace Negative, trace mg/dL   Poct Glucose Normal Normal mg/dL   Poct Ketones + small (A) Negative   Poct Urobilinogen Normal Normal mg/dL   Poct Bilirubin + (A) Negative   Poct Blood Negative Negative, trace  Cervicovaginal ancillary only     Status: Abnormal   Collection Time: 09/01/22 10:26 AM  Result Value Ref Range   Bacterial Vaginitis (gardnerella) Positive (A)    Candida Vaginitis Negative    Candida Glabrata Negative    Trichomonas Positive (A)    Chlamydia Negative    Neisseria Gonorrhea Negative    Comment      Normal Reference Range Bacterial Vaginosis - Negative   Comment Normal Reference Range Candida Species - Negative    Comment Normal Reference Ranger Chlamydia - Negative    Comment Normal Reference Range Candida Galbrata - Negative    Comment  Normal Reference Range Trichomonas - Negative    Comment      Normal Reference Range Neisseria Gonorrhea - Negative  Lipid panel     Status: None   Collection Time: 09/01/22 10:37 AM  Result Value Ref Range   Cholesterol 168 <200 mg/dL   HDL 57 > OR = 50 mg/dL   Triglycerides 125 <150 mg/dL   LDL Cholesterol (Calc) 88 mg/dL (calc)    Comment: Reference range: <100 . Desirable range <100 mg/dL for primary prevention;   <70 mg/dL for patients with CHD or diabetic patients  with > or = 2 CHD risk factors. Marland Kitchen LDL-C is now calculated using the Martin-Hopkins  calculation, which is a validated novel method providing  better accuracy than the Friedewald equation in the  estimation of LDL-C.  Cresenciano Genre et al. Annamaria Helling. [id number]): 2061-2068  (http://education.[facility].com/faq/FAQ164)    Total CHOL/HDL Ratio 2.9 <5.0 (calc)   Non-HDL Cholesterol (Calc) 111 <130 mg/dL (calc)    Comment: For patients with diabetes  plus 1 major ASCVD risk  factor, treating to a non-HDL-C goal of <100 mg/dL  (LDL-C of <70 mg/dL) is considered a therapeutic  option.   COMPLETE METABOLIC PANEL WITH GFR     Status: None   Collection Time: 09/01/22 10:37 AM  Result Value Ref Range   Glucose, Bld 92 65 - 99 mg/dL    Comment: .            Fasting reference interval .    BUN 10 7 - 25 mg/dL   Creat 0.94 0.50 - 0.99 mg/dL   eGFR 77 > OR = 60 mL/min/1.80m   BUN/Creatinine Ratio SEE NOTE: 6 - 22 (calc)    Comment:    Not Reported: BUN and Creatinine are within    reference range. .    Sodium 138 135 - 146 mmol/L   Potassium 4.1 3.5 - 5.3 mmol/L   Chloride 104 98 - 110 mmol/L   CO2 23 20 - 32 mmol/L   Calcium 9.3 8.6 - 10.2 mg/dL   Total Protein 7.8 6.1 - 8.1 g/dL   Albumin 4.4 3.6 - 5.1 g/dL   Globulin 3.4 1.9 - 3.7 g/dL (calc)   AG Ratio 1.3 1.0 - 2.5 (calc)   Total Bilirubin 0.4 0.2 - 1.2 mg/dL   Alkaline phosphatase (APISO) 50 31 - 125 U/L   AST 17 10 - 30 U/L   ALT 19 6 - 29 U/L  CBC  with Differential/Platelet     Status: None   Collection Time: 09/01/22 10:37 AM  Result Value Ref Range   WBC 5.0 3.8 - 10.8 Thousand/uL   RBC 4.95 3.80 - 5.10 Million/uL   Hemoglobin 13.7 11.7 - 15.5 g/dL   HCT 41.2 35.0 - 45.0 %   MCV 83.2 80.0 - 100.0 fL   MCH 27.7 27.0 - 33.0 pg   MCHC 33.3 32.0 - 36.0 g/dL   RDW 13.1 11.0 - 15.0 %   Platelets 269 140 - 400 Thousand/uL   MPV 9.9 7.5 - 12.5 fL   Neutro Abs 2,910 1,500 - 7,800 cells/uL   Lymphs Abs 1,560 850 - 3,900 cells/uL   Absolute Monocytes 410 200 - 950 cells/uL   Eosinophils Absolute 100 15 - 500 cells/uL   Basophils Absolute 20 0 - 200 cells/uL   Neutrophils Relative % 58.2 %   Total Lymphocyte 31.2 %   Monocytes Relative 8.2 %   Eosinophils Relative 2.0 %   Basophils Relative 0.4 %  Hemoglobin A1c     Status: Abnormal   Collection Time: 09/01/22 10:37 AM  Result Value Ref Range   Hgb A1c MFr Bld 5.8 (H) <5.7 % of total Hgb    Comment: For someone without known diabetes, a hemoglobin  A1c value between 5.7% and 6.4% is consistent with prediabetes and should be confirmed with a  follow-up test. . For someone with known diabetes, a value <7% indicates that their diabetes is well controlled. A1c targets should be individualized based on duration of diabetes, age, comorbid conditions, and other considerations. . This assay result is consistent with an increased risk of diabetes. . Currently, no consensus exists regarding use of hemoglobin A1c for diagnosis of diabetes for children. .    Mean Plasma Glucose 120 mg/dL   eAG (mmol/L) 6.6 mmol/L    Comment: . HbA1c performed on Roche platform. Effective 05/19/22 a change in test platforms may have  shifted HbA1c results compared to historical results.   TSH  Status: None   Collection Time: 09/01/22 10:37 AM  Result Value Ref Range   TSH 1.36 mIU/L    Comment:           Reference Range .           > or = 20 Years  0.40-4.50 .                Pregnancy  Ranges           First trimester    0.26-2.66           Second trimester   0.55-2.73           Third trimester    0.43-2.91   VITAMIN D 25 Hydroxy (Vit-D Deficiency, Fractures)     Status: None   Collection Time: 09/01/22 10:37 AM  Result Value Ref Range   Vit D, 25-Hydroxy 31 30 - 100 ng/mL    Comment: Vitamin D Status         25-OH Vitamin D: . Deficiency:                    <20 ng/mL Insufficiency:             20 - 29 ng/mL Optimal:                 > or = 30 ng/mL . For 25-OH Vitamin D testing on patients on  D2-supplementation and patients for whom quantitation  of D2 and D3 fractions is required, the QuestAssureD(TM) 25-OH VIT D, (D2,D3), LC/MS/MS is recommended: order  code [phone] (patients >22yr). . See Note 1 . Note 1 . For additional information, please refer to  http://education.[facility].com/faq/FAQ199  (This link is being provided for informational/ educational purposes only.)   RPR     Status: None   Collection Time: 09/01/22 10:37 AM  Result Value Ref Range   RPR Ser Ql NON-REACTIVE NON-REACTIVE    Comment: . No laboratory evidence of syphilis. If recent exposure is suspected, submit a new sample in 2-4 weeks. .Marland Kitchen  HIV Antibody (routine testing w rflx)     Status: None   Collection Time: 09/01/22 10:37 AM  Result Value Ref Range   HIV 1&2 Ab, 4th Generation NON-REACTIVE NON-REACTIVE    Comment: HIV-1 antigen and HIV-1/HIV-2 antibodies were not detected. There is no laboratory evidence of HIV infection. .Marland KitchenPLEASE NOTE: This information has been disclosed to you from records whose confidentiality may be protected by state law.  If your state requires such protection, then the state law prohibits you from making any further disclosure of the information without the specific written consent of the person to whom it pertains, or as otherwise permitted by law. A general authorization for the release of medical or other information is NOT sufficient  for this purpose. . For additional information please refer to http://education.[facility].com/faq/FAQ106 (This link is being provided for informational/ educational purposes only.) . .Marland KitchenThe performance of this assay has not been clinically validated in patients less than 233years old. .   B12 and Folate Panel     Status: None   Collection Time: 09/01/22 10:37 AM  Result Value Ref Range   Vitamin B-12 764 200 - 1,100 pg/mL   Folate >24.0 ng/mL    Comment:                            Reference Range  Low:           <3.4                            Borderline:    3.4-5.4                            Normal:        >5.4 .   Urine Culture     Status: None   Collection Time: 09/01/22 11:07 AM   Specimen: Urine  Result Value Ref Range   MICRO NUMBER: [id number]    SPECIMEN QUALITY: Adequate    Sample Source URINE    STATUS: FINAL    Result:      Less than 10,000 CFU/mL of single Gram positive organism isolated. No further testing will be performed. If clinically indicated, recollection using a method to minimize contamination, with prompt transfer to Urine Culture Transport Tube, is recommended.  HM DIABETES EYE EXAM     Status: None   Collection Time: 09/17/22 12:00 AM  Result Value Ref Range   HM Diabetic Eye Exam No Retinopathy No Retinopathy     Fall Risk:    09/29/2022   11:35 AM 08/06/2022    9:47 AM 02/28/2022   11:20 AM 01/08/2022   10:18 AM 08/02/2021    9:49 AM  Fall Risk   Falls in the past year? 0 0 0 0 0  Number falls in past yr:  0 0  0  Injury with Fall?  0 0  0  Risk for fall due to : No Fall Risks  No Fall Risks No Fall Risks No Fall Risks  Follow up Falls prevention discussed;Education provided;Falls evaluation completed Falls evaluation completed Falls prevention discussed Falls prevention discussed Falls prevention discussed     Functional Status Survey: Is the patient deaf or have difficulty hearing?: No Does the patient  have difficulty seeing, even when wearing glasses/contacts?: No Does the patient have difficulty concentrating, remembering, or making decisions?: No Does the patient have difficulty walking or climbing stairs?: No Does the patient have difficulty dressing or bathing?: No Does the patient have difficulty doing errands alone such as visiting a doctor's office or shopping?: No   Assessment & Plan  1. Well adult exam   2. Cervical cancer screening  - Cytology - PAP  3. Breast cancer screening by mammogram  - MM Digital Screening; Future    -USPSTF grade A and B recommendations reviewed with patient; age-appropriate recommendations, preventive care, screening tests, etc discussed and encouraged; healthy living encouraged; see AVS for patient education given to patient -Discussed importance of 150 minutes of physical activity weekly, eat two servings of fish weekly, eat one serving of tree nuts ( cashews, pistachios, pecans, almonds.Marland Kitchen) every other day, eat 6 servings of fruit/vegetables daily and drink plenty of water and avoid sweet beverages.   -Reviewed Health Maintenance: Yes.

## 2022-09-26 NOTE — Patient Instructions (Signed)
Preventive Care 40-44 Years Old, Female Preventive care refers to lifestyle choices and visits with your health care provider that can promote health and wellness. Preventive care visits are also called wellness exams. What can I expect for my preventive care visit? Counseling Your health care provider may ask you questions about your: Medical history, including: Past medical problems. Family medical history. Pregnancy history. Current health, including: Menstrual cycle. Method of birth control. Emotional well-being. Home life and relationship well-being. Sexual activity and sexual health. Lifestyle, including: Alcohol, nicotine or tobacco, and drug use. Access to firearms. Diet, exercise, and sleep habits. Work and work environment. Sunscreen use. Safety issues such as seatbelt and bike helmet use. Physical exam Your health care provider will check your: Height and weight. These may be used to calculate your BMI (body mass index). BMI is a measurement that tells if you are at a healthy weight. Waist circumference. This measures the distance around your waistline. This measurement also tells if you are at a healthy weight and may help predict your risk of certain diseases, such as type 2 diabetes and high blood pressure. Heart rate and blood pressure. Body temperature. Skin for abnormal spots. What immunizations do I need?  Vaccines are usually given at various ages, according to a schedule. Your health care provider will recommend vaccines for you based on your age, medical history, and lifestyle or other factors, such as travel or where you work. What tests do I need? Screening Your health care provider may recommend screening tests for certain conditions. This may include: Lipid and cholesterol levels. Diabetes screening. This is done by checking your blood sugar (glucose) after you have not eaten for a while (fasting). Pelvic exam and Pap test. Hepatitis B test. Hepatitis C  test. HIV (human immunodeficiency virus) test. STI (sexually transmitted infection) testing, if you are at risk. Lung cancer screening. Colorectal cancer screening. Mammogram. Talk with your health care provider about when you should start having regular mammograms. This may depend on whether you have a family history of breast cancer. BRCA-related cancer screening. This may be done if you have a family history of breast, ovarian, tubal, or peritoneal cancers. Bone density scan. This is done to screen for osteoporosis. Talk with your health care provider about your test results, treatment options, and if necessary, the need for more tests. Follow these instructions at home: Eating and drinking  Eat a diet that includes fresh fruits and vegetables, whole grains, lean protein, and low-fat dairy products. Take vitamin and mineral supplements as recommended by your health care provider. Do not drink alcohol if: Your health care provider tells you not to drink. You are pregnant, may be pregnant, or are planning to become pregnant. If you drink alcohol: Limit how much you have to 0-1 drink a day. Know how much alcohol is in your drink. In the [place] one drink equals one 12 oz bottle of beer (355 mL), one 5 oz glass of wine (148 mL), or one 1 oz glass of hard liquor (44 mL). Lifestyle Brush your teeth every morning and night with fluoride toothpaste. Floss one time each day. Exercise for at least 30 minutes 5 or more days each week. Do not use any products that contain nicotine or tobacco. These products include cigarettes, chewing tobacco, and vaping devices, such as e-cigarettes. If you need help quitting, ask your health care provider. Do not use drugs. If you are sexually active, practice safe sex. Use a condom or other form of protection to   prevent STIs. If you do not wish to become pregnant, use a form of birth control. If you plan to become pregnant, see your health care provider for a  prepregnancy visit. Take aspirin only as told by your health care provider. Make sure that you understand how much to take and what form to take. Work with your health care provider to find out whether it is safe and beneficial for you to take aspirin daily. Find healthy ways to manage stress, such as: Meditation, yoga, or listening to music. Journaling. Talking to a trusted person. Spending time with friends and family. Minimize exposure to UV radiation to reduce your risk of skin cancer. Safety Always wear your seat belt while driving or riding in a vehicle. Do not drive: If you have been drinking alcohol. Do not ride with someone who has been drinking. When you are tired or distracted. While texting. If you have been using any mind-altering substances or drugs. Wear a helmet and other protective equipment during sports activities. If you have firearms in your house, make sure you follow all gun safety procedures. Seek help if you have been physically or sexually abused. What's next? Visit your health care provider once a year for an annual wellness visit. Ask your health care provider how often you should have your eyes and teeth checked. Stay up to date on all vaccines. This information is not intended to replace advice given to you by your health care provider. Make sure you discuss any questions you have with your health care provider. Document Revised: 01/23/2021 Document Reviewed: 01/23/2021 Elsevier Patient Education  2023 Elsevier Inc.  

## 2022-09-29 ENCOUNTER — Other Ambulatory Visit (HOSPITAL_COMMUNITY)
Admission: RE | Admit: 2022-09-29 | Discharge: 2022-09-29 | Disposition: A | Discharge status: Home / Self Care | Payer: BC Managed Care – PPO | Source: Ambulatory Visit | Attending: Family Medicine | Admitting: Family Medicine

## 2022-09-29 ENCOUNTER — Encounter: Payer: Self-pay | Admitting: Family Medicine

## 2022-09-29 ENCOUNTER — Ambulatory Visit (INDEPENDENT_AMBULATORY_CARE_PROVIDER_SITE_OTHER): Payer: BC Managed Care – PPO | Admitting: Family Medicine

## 2022-09-29 VITALS — BP 126/84 | HR 72 | Temp 97.9°F | Resp 16 | Ht 60.0 in | Wt 199.3 lb

## 2022-09-29 DIAGNOSIS — Z1231 Encounter for screening mammogram for malignant neoplasm of breast: Secondary | ICD-10-CM

## 2022-09-29 DIAGNOSIS — Z0000 Encounter for general adult medical examination without abnormal findings: Secondary | ICD-10-CM | POA: Diagnosis not present

## 2022-09-29 DIAGNOSIS — Z124 Encounter for screening for malignant neoplasm of cervix: Secondary | ICD-10-CM

## 2022-09-30 LAB — CYTOLOGY - PAP
Chlamydia: NEGATIVE
Comment: NEGATIVE
Comment: NEGATIVE
Comment: NORMAL
Diagnosis: NEGATIVE
High risk HPV: NEGATIVE
Neisseria Gonorrhea: NEGATIVE

## 2022-10-02 DIAGNOSIS — Z79899 Other long term (current) drug therapy: Secondary | ICD-10-CM | POA: Diagnosis not present

## 2022-10-02 DIAGNOSIS — K219 Gastro-esophageal reflux disease without esophagitis: Secondary | ICD-10-CM | POA: Diagnosis not present

## 2022-10-02 DIAGNOSIS — E669 Obesity, unspecified: Secondary | ICD-10-CM | POA: Diagnosis not present

## 2022-10-02 DIAGNOSIS — R0683 Snoring: Secondary | ICD-10-CM | POA: Diagnosis not present

## 2022-10-02 DIAGNOSIS — F419 Anxiety disorder, unspecified: Secondary | ICD-10-CM | POA: Diagnosis not present

## 2022-10-02 DIAGNOSIS — R079 Chest pain, unspecified: Secondary | ICD-10-CM | POA: Diagnosis not present

## 2022-10-02 DIAGNOSIS — Z6838 Body mass index (BMI) 38.0-38.9, adult: Secondary | ICD-10-CM | POA: Diagnosis not present

## 2022-10-02 DIAGNOSIS — I341 Nonrheumatic mitral (valve) prolapse: Secondary | ICD-10-CM | POA: Diagnosis not present

## 2022-10-02 DIAGNOSIS — Z6839 Body mass index (BMI) 39.0-39.9, adult: Secondary | ICD-10-CM | POA: Diagnosis not present

## 2022-10-10 DIAGNOSIS — R7309 Other abnormal glucose: Secondary | ICD-10-CM | POA: Diagnosis not present

## 2022-10-15 ENCOUNTER — Encounter: Payer: Self-pay | Admitting: Family Medicine

## 2022-11-04 ENCOUNTER — Ambulatory Visit (INDEPENDENT_AMBULATORY_CARE_PROVIDER_SITE_OTHER): Payer: Self-pay | Admitting: Physician Assistant

## 2022-11-04 ENCOUNTER — Other Ambulatory Visit: Payer: Self-pay

## 2022-11-04 VITALS — BP 140/86 | HR 71 | Temp 98.4°F

## 2022-11-04 DIAGNOSIS — J069 Acute upper respiratory infection, unspecified: Secondary | ICD-10-CM

## 2022-11-04 DIAGNOSIS — H6123 Impacted cerumen, bilateral: Secondary | ICD-10-CM

## 2022-11-04 MED ORDER — ALBUTEROL SULFATE HFA 108 (90 BASE) MCG/ACT IN AERS: 2.0000 | INHALATION_SPRAY | Freq: Four times a day (QID) | RESPIRATORY_TRACT | 1 refills | Status: DC | PRN

## 2022-11-04 MED ORDER — FLUTICASONE PROPIONATE 50 MCG/ACT NA SUSP: 2.0000 | Freq: Every day | NASAL | 6 refills | Status: DC

## 2022-11-04 NOTE — Progress Notes (Signed)
[facility] [address], [place] [address]   Office Visit Note  Patient Name: Bethany [name] Date of Birth [id number]  Medical Record number [id number]  Date of Service: 11/04/2022  Chief Complaint  Patient presents with   Sore Throat   Nasal Congestion   Cough    productive     42 y/o F presents to the clinic for c/o sore throat and cough x 3-4 days. Pt's symptoms started while she was in [place]. She denies known exposure to covid or flu. Denies known exposure to strep. Able to eat and drink, Denies CP, SOB, body aches, fever, or chills. Mild wheezing especially with fits.   Sore Throat  Associated symptoms include congestion and coughing. Pertinent negatives include no trouble swallowing.  Cough Associated symptoms include a sore throat.      Current Medication:  Outpatient Encounter Medications as of 11/04/2022  Medication Sig   albuterol (VENTOLIN HFA) 108 (90 Base) MCG/ACT inhaler Inhale 2 puffs into the lungs every 6 (six) hours as needed for wheezing or shortness of breath.   fexofenadine (ALLEGRA) 180 MG tablet Take 180 mg by mouth as needed for allergies or rhinitis.   fluticasone (FLONASE) 50 MCG/ACT nasal spray Place 1 spray into both nostrils 2 (two) times daily as needed for allergies or rhinitis.   fluticasone (FLONASE) 50 MCG/ACT nasal spray Place 2 sprays into both nostrils daily.   metoprolol succinate (TOPROL-XL) 50 MG 24 hr tablet Take 1 tablet (50 mg total) by mouth daily.   Multiple Vitamin (MULTIVITAMIN WITH MINERALS) TABS tablet Take 1 tablet by mouth daily.   Probiotic Product (UP4 PROBIOTICS WOMENS PO) Take by mouth.   No facility-administered encounter medications on file as of 11/04/2022.      Medical History: Past Medical History:  Diagnosis Date   Diabetes mellitus without complication ([place])    Hypertension      Vital Signs: BP (!) 140/86 (BP Location: Left Arm, Patient Position: Sitting)   Pulse 71   Temp 98.4 F  (36.9 C) (Tympanic)   SpO2 98%    Review of Systems  Constitutional: Negative.   HENT:  Positive for congestion and sore throat. Negative for sinus pressure, sinus pain, sneezing and trouble swallowing.   Respiratory:  Positive for cough.   Neurological: Negative.     Physical Exam Constitutional:      Appearance: Normal appearance.  HENT:     Head: Atraumatic.     Right Ear: Ear canal and external ear normal.     Left Ear: Ear canal and external ear normal.     Ears:     Comments: Both ears occluded with cerumen    Nose: Nose normal.     Mouth/Throat:     Mouth: Mucous membranes are moist.     Pharynx: Oropharynx is clear.  Eyes:     Extraocular Movements: Extraocular movements intact.  Cardiovascular:     Rate and Rhythm: Normal rate and regular rhythm.  Pulmonary:     Effort: Pulmonary effort is normal.     Breath sounds: Normal breath sounds.  Musculoskeletal:     Cervical back: Neck supple.  Skin:    General: Skin is warm.  Neurological:     Mental Status: She is alert.  Psychiatric:        Mood and Affect: Mood normal.        Behavior: Behavior normal.        Thought Content: Thought content normal.  Judgment: Judgment normal.       Assessment/Plan:  1. Viral upper respiratory tract infection - fluticasone (FLONASE) 50 MCG/ACT nasal spray; Place 2 sprays into both nostrils daily.  Dispense: 16 g; Refill: 6 - albuterol (VENTOLIN HFA) 108 (90 Base) MCG/ACT inhaler; Inhale 2 puffs into the lungs every 6 (six) hours as needed for wheezing or shortness of breath.  Dispense: 8 g; Refill: 1  2. Cerumen debris on tympanic membrane of both ears  Reviewed my clinical findings with patient. Increase fluids Continue with Flonase and otc anti-histamine.  Use Albuterol for wheezing as needed. Take throat logenzes as directed on the box. Continue to watch for worsening symptoms. RTC prn Pt verbalized understanding and in agreement.  We also discussed  healthy diet with portion control, increasing fresh fruits and veggies, exercise daily. Reduce salt intake.  She verbalized understanding.    General Counseling: [name] verbalizes understanding of the findings of todays visit and agrees with plan of treatment. I have discussed any further diagnostic evaluation that may be needed or ordered today. We also reviewed her medications today. she has been encouraged to call the office with any questions or concerns that should arise related to todays visit.    Time spent:[address] [place], [place] Physician Assistant

## 2022-11-10 DIAGNOSIS — R7309 Other abnormal glucose: Secondary | ICD-10-CM | POA: Diagnosis not present

## 2022-11-28 NOTE — Progress Notes (Unsigned)
Name: Bethany [name]   MRN: [id number]    DOB: Jun 26, 1981   Date:12/01/2022       Progress Note  Subjective  Chief Complaint  Follow Up  HPI  MDD/Anxiety: she tried medication Duloxetine, Prozac and hydroxyzine but had side effects and stopped it on her own.  She is currently in therapy, son is going to [place] this Fall, grandson moved to [place] and daughter is the only one at home. 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 She is feeling better emotionally   Pre-diabetes/Obesity : she is changing positions at work, she will be more active and less access to junk food. Last A1C was up to 5.8 % and will monitor. Denies polyphagia, polydipsia or polyuria    Migraine and left arm numbness:She was seeing neurologist - Dr. Sherryll Burger -  she stopped taking magnesium but still takes MVI . Seems like Metoprolol and is doing well, seems to have improved  MRI normal in the past.  Unchanged   Perennial Allergic: taking flonase and allegra, doing well on medication    PVC/MVP/HTN: she saw [facility] cardiologist  and had Echo and EKG, she is on Metoprolol, she denies recent episodes of palpitation, recently seen by cardiologist. She has noticed some SOB when walking up a hill. We will add hctz since bp has been elevated lately   Echo   Normal left ventricular systolic function, ejection fraction > 55%  Mitral valve prolapse - moderate  Mitral regurgitation - mild  Normal right ventricular systolic function   Holter event monitor ( June 2020 )    Patient had a min HR of 36 bpm, max HR of 154 bpm, and avg HR of 72 bpm. Predominant underlying rhythm was Sinus Rhythm. Isolated SVEs were rare (<1.0%), SVE Couplets were rare (<1.0%), and no SVE Triplets were present. Isolated VEs were occasional (1.3%, 18369), VE Couplets were rare (<1.0%, 39), and VE Triplets were rare (<1.0%, 4). Ventricular Bigeminy and Trigeminy were present.  One patient triggered event was associated  with sinus rhythm.  Patient Active Problem List   Diagnosis Date Noted   Palpitations 08/23/2020   Mitral valve prolapse 01/11/2019   Hypertension, benign 09/14/2018   Chronic left hip pain 12/04/2017   Obesity, Class II, BMI 35-39.9 12/04/2017   Abnormal finding on MRI of brain 11/05/2017   Mild major depression 09/11/2017   Patellofemoral stress syndrome of both knees 08/09/2014    Past Surgical History:  Procedure Laterality Date   CESAREAN SECTION  2002   CESAREAN SECTION  2005    Family History  Problem Relation Age of Onset   Emphysema Mother    Hypercalcemia Mother    Hypertension Mother    Sickle cell trait Mother    Clotting disorder Mother    Prostate cancer Father    Diabetes Mellitus II Father    Hypertension Father    Heart Problems Father        Enlarged Heart   Stroke Maternal Grandmother    Heart failure Maternal Grandmother    Emphysema Maternal Grandfather    Lung cancer Maternal Aunt    Breast cancer Maternal Aunt        early 60's   Lung cancer Maternal Uncle    Kidney disease Paternal Aunt    Seizures Half-Sister    Thyroid disease Half-Sister     Social History   Tobacco Use   Smoking status: Never   Smokeless tobacco: Never  Substance Use Topics   Alcohol use: Not Currently    Alcohol/week: 0.0 standard drinks of alcohol    Comment: occasionally     Current Outpatient Medications:    fexofenadine (ALLEGRA) 180 MG tablet, Take 180 mg by mouth as needed for allergies or rhinitis., Disp: , Rfl:    Metoprolol-Hydrochlorothiazide 50-12.5 MG TB24, Take 1 tablet by mouth daily at 12 noon., Disp: 90 tablet, Rfl: 0   Multiple Vitamin (MULTIVITAMIN WITH MINERALS) TABS tablet, Take 1 tablet by mouth daily., Disp: , Rfl:    Probiotic Product (UP4 PROBIOTICS WOMENS PO), Take by mouth., Disp: , Rfl:    fluticasone (FLONASE) 50 MCG/ACT nasal spray, Place 2 sprays into both nostrils daily., Disp: 48 g, Rfl: 1  Allergies  Allergen Reactions    Pantoprazole     Pain in lef,t arm numbness and trouble moving arm     I personally reviewed active problem list, medication list, allergies, family history, social history, health maintenance with the patient/caregiver today.   ROS  Ten systems reviewed and is negative except as mentioned in HPI   Objective  Vitals:   12/01/22 1030 12/01/22 1052  BP: (!) 146/86 (!) 142/86  Pulse: 77   Resp: 18   Temp: 98.4 F (36.9 C)   TempSrc: Oral   SpO2: 100%   Weight: 198 lb 3.2 oz (89.9 kg)   Height: 5' (1.524 m)     Body mass index is 38.71 kg/m.  Physical Exam  Constitutional: Patient appears well-developed and well-nourished. Obese  No distress.  HEENT: head atraumatic, normocephalic, pupils equal and reactive to light, neck supple Cardiovascular: Normal rate, regular rhythm and normal heart sounds.  No murmur heard. No BLE edema. Pulmonary/Chest: Effort normal and breath sounds normal. No respiratory distress. Abdominal: Soft.  There is no tenderness. Psychiatric: Patient has a normal mood and affect. behavior is normal. Judgment and thought content normal.    PHQ2/9:    12/01/2022   10:32 AM 09/29/2022   11:32 AM 09/01/2022    9:53 AM 08/06/2022    9:47 AM 02/28/2022   11:27 AM  Depression screen PHQ 2/9  Decreased Interest 0 0  Down, Depressed, Hopeless 0 0  PHQ - 2 Score 0 0  Altered sleeping 0  Tired, decreased energy Change in appetite Feeling bad or failure about yourself  0 0 0  0  Trouble concentrating Moving slowly or fidgety/restless 0 0 1  0  Suicidal thoughts 0 0 0  0  PHQ-9 Score Difficult doing work/chores Not difficult at all Somewhat difficult Somewhat difficult      phq 9 is positive   Fall Risk:    12/01/2022   10:32 AM 09/29/2022   11:35 AM 08/06/2022    9:47 AM 02/28/2022   11:20 AM 01/08/2022   10:18 AM  Fall Risk   Falls in the past year? 0 0 0 0 0  Number falls in past  yr:   0 0   Injury with Fall?   0 0   Risk for fall due to : No Fall Risks No Fall Risks  No Fall Risks No Fall Risks  Follow up Falls prevention discussed Falls prevention discussed;Education provided;Falls evaluation completed Falls evaluation completed Falls prevention discussed Falls prevention discussed  Functional Status Survey: Is the patient deaf or have difficulty hearing?: No Does the patient have difficulty seeing, even when wearing glasses/contacts?: No Does the patient have difficulty concentrating, remembering, or making decisions?: No Does the patient have difficulty walking or climbing stairs?: No Does the patient have difficulty dressing or bathing?: No Does the patient have difficulty doing errands alone such as visiting a doctor's office or shopping?: No    Assessment & Plan  1. PVC (premature ventricular contraction)  - Metoprolol-Hydrochlorothiazide 50-12.5 MG TB24; Take 1 tablet by mouth daily at 12 noon.  Dispense: 90 tablet; Refill: 0  2. Migraine without aura and without status migrainosus, not intractable  Doing well   3. Hypertension, benign  - Metoprolol-Hydrochlorothiazide 50-12.5 MG TB24; Take 1 tablet by mouth daily at 12 noon.  Dispense: 90 tablet; Refill: 0  4. Vitamin D deficiency  Continue vitamin D   5. Major depression, recurrent, chronic   6. Pre-diabetes  Continue life style modification   7. MVP (mitral valve prolapse)  On beta blocker   8. GAD (generalized anxiety disorder)   9. Perennial allergic rhinitis  - fluticasone (FLONASE) 50 MCG/ACT nasal spray; Place 2 sprays into both nostrils daily.  Dispense: 48 g; Refill: 1

## 2022-12-01 ENCOUNTER — Other Ambulatory Visit: Payer: Self-pay | Admitting: Family Medicine

## 2022-12-01 ENCOUNTER — Encounter: Payer: Self-pay | Admitting: Family Medicine

## 2022-12-01 ENCOUNTER — Ambulatory Visit: Payer: BC Managed Care – PPO | Admitting: Family Medicine

## 2022-12-01 VITALS — BP 142/86 | HR 77 | Temp 98.4°F | Resp 18 | Ht 60.0 in | Wt 198.2 lb

## 2022-12-01 DIAGNOSIS — G43009 Migraine without aura, not intractable, without status migrainosus: Secondary | ICD-10-CM | POA: Diagnosis not present

## 2022-12-01 DIAGNOSIS — I493 Ventricular premature depolarization: Secondary | ICD-10-CM | POA: Diagnosis not present

## 2022-12-01 DIAGNOSIS — I10 Essential (primary) hypertension: Secondary | ICD-10-CM | POA: Diagnosis not present

## 2022-12-01 DIAGNOSIS — F411 Generalized anxiety disorder: Secondary | ICD-10-CM

## 2022-12-01 DIAGNOSIS — I341 Nonrheumatic mitral (valve) prolapse: Secondary | ICD-10-CM

## 2022-12-01 DIAGNOSIS — E559 Vitamin D deficiency, unspecified: Secondary | ICD-10-CM | POA: Diagnosis not present

## 2022-12-01 DIAGNOSIS — J3089 Other allergic rhinitis: Secondary | ICD-10-CM

## 2022-12-01 DIAGNOSIS — R7303 Prediabetes: Secondary | ICD-10-CM

## 2022-12-01 DIAGNOSIS — F339 Major depressive disorder, recurrent, unspecified: Secondary | ICD-10-CM

## 2022-12-01 MED ORDER — HYDROCHLOROTHIAZIDE 12.5 MG PO TABS
12.5000 mg | ORAL_TABLET | Freq: Every day | ORAL | 0 refills | Status: DC
Start: 2022-12-01 — End: 2023-03-03

## 2022-12-01 MED ORDER — FLUTICASONE PROPIONATE 50 MCG/ACT NA SUSP: 2.0000 | Freq: Every day | NASAL | 1 refills | Status: AC

## 2022-12-01 MED ORDER — METOPROLOL SUCCINATE ER 50 MG PO TB24
50.0000 mg | ORAL_TABLET | Freq: Every day | ORAL | 0 refills | Status: DC
Start: 2022-12-01 — End: 2023-03-03

## 2022-12-01 MED ORDER — METOPROLOL-HCTZ ER 50-12.5 MG PO TB24
1.0000 | ORAL_TABLET | Freq: Every day | ORAL | 0 refills | Status: DC
Start: 2022-12-01 — End: 2022-12-01

## 2022-12-03 ENCOUNTER — Encounter: Payer: Self-pay | Admitting: Family Medicine

## 2022-12-04 ENCOUNTER — Other Ambulatory Visit: Payer: Self-pay | Admitting: Family Medicine

## 2022-12-04 DIAGNOSIS — Z1231 Encounter for screening mammogram for malignant neoplasm of breast: Secondary | ICD-10-CM

## 2022-12-10 DIAGNOSIS — R7309 Other abnormal glucose: Secondary | ICD-10-CM | POA: Diagnosis not present

## 2022-12-30 ENCOUNTER — Ambulatory Visit
Admission: RE | Admit: 2022-12-30 | Discharge: 2022-12-30 | Disposition: A | Discharge status: Home / Self Care | Payer: BC Managed Care – PPO | Source: Ambulatory Visit | Attending: Family Medicine | Admitting: Family Medicine

## 2022-12-30 DIAGNOSIS — Z1231 Encounter for screening mammogram for malignant neoplasm of breast: Secondary | ICD-10-CM | POA: Insufficient documentation

## 2023-01-10 DIAGNOSIS — R7309 Other abnormal glucose: Secondary | ICD-10-CM | POA: Diagnosis not present

## 2023-01-30 DIAGNOSIS — E669 Obesity, unspecified: Secondary | ICD-10-CM | POA: Diagnosis not present

## 2023-02-09 DIAGNOSIS — R7309 Other abnormal glucose: Secondary | ICD-10-CM | POA: Diagnosis not present

## 2023-03-02 DIAGNOSIS — E669 Obesity, unspecified: Secondary | ICD-10-CM | POA: Diagnosis not present

## 2023-03-02 NOTE — Progress Notes (Unsigned)
Name: Bethany [name]   MRN: [id number]    DOB: 02-Dec-1980   Date:03/03/2023       Progress Note  Subjective  Chief Complaint  Follow Up  HPI  MDD/Anxiety: she tried medication Duloxetine, Prozac and hydroxyzine but had side effects and stopped it on her own.  She is currently in therapy, son is going to [place] this Fall, grandson moved to [place] and daughter is the only one at home. Her daughter is the main source of stress. 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 She is feeling better emotionally   Pre-diabetes/Obesity : she is changing positions at work, she will be more active and less access to junk food. Last A1C was up to 5.8 % and will monitor. Denies polyphagia, polydipsia or polyuria . She is trying to go to the gym more often    Migraine and left arm numbness:She was seeing neurologist - Dr. Sherryll Burger -  she stopped taking magnesium but still takes MVI . She takes  Metoprolol and episodes are under control.    Perennial Allergic: taking flonase and allegra, doing well on medication    PVC/MVP/HTN: she saw [facility] cardiologist  and had Echo and EKG, she is on Metoprolol and hydrochlorothiazide now , she denies recent episodes of palpitation She still has some SOB when walking for a long time. She is under the care of cardiologist, bp is at goal today   Echo   Normal left ventricular systolic function, ejection fraction > 55%  Mitral valve prolapse - moderate  Mitral regurgitation - mild  Normal right ventricular systolic function   Holter event monitor ( June 2020 )    Patient had a min HR of 36 bpm, max HR of 154 bpm, and avg HR of 72 bpm. Predominant underlying rhythm was Sinus Rhythm. Isolated SVEs were rare (<1.0%), SVE Couplets were rare (<1.0%), and no SVE Triplets were present. Isolated VEs were occasional (1.3%, 18369), VE Couplets were rare (<1.0%, 39), and VE Triplets were rare (<1.0%, 4). Ventricular Bigeminy and Trigeminy were  present.  One patient triggered event was associated with sinus rhythm.  Patient Active Problem List   Diagnosis Date Noted   Palpitations 08/23/2020   Mitral valve prolapse 01/11/2019   Hypertension, benign 09/14/2018   Chronic left hip pain 12/04/2017   Obesity, Class II, BMI 35-39.9 12/04/2017   Abnormal finding on MRI of brain 11/05/2017   Mild major depression (HCC) 09/11/2017   Patellofemoral stress syndrome of both knees 08/09/2014    Past Surgical History:  Procedure Laterality Date   CESAREAN SECTION  2002   CESAREAN SECTION  2005    Family History  Problem Relation Age of Onset   Emphysema Mother    Hypercalcemia Mother    Hypertension Mother    Sickle cell trait Mother    Clotting disorder Mother    Prostate cancer Father    Diabetes Mellitus II Father    Hypertension Father    Heart Problems Father        Enlarged Heart   Stroke Maternal Grandmother    Heart failure Maternal Grandmother    Emphysema Maternal Grandfather    Lung cancer Maternal Aunt    Breast cancer Maternal Aunt        early 60's   Lung cancer Maternal Uncle    Kidney disease Paternal Aunt    Seizures Half-Sister    Thyroid disease Half-Sister     Social History  Tobacco Use   Smoking status: Never   Smokeless tobacco: Never  Substance Use Topics   Alcohol use: Not Currently    Alcohol/week: 0.0 standard drinks of alcohol    Comment: occasionally     Current Outpatient Medications:    fexofenadine (ALLEGRA) 180 MG tablet, Take 180 mg by mouth as needed for allergies or rhinitis., Disp: , Rfl:    fluticasone (FLONASE) 50 MCG/ACT nasal spray, Place 2 sprays into both nostrils daily., Disp: 48 g, Rfl: 1   hydrochlorothiazide (HYDRODIURIL) 12.5 MG tablet, Take 1 tablet (12.5 mg total) by mouth daily., Disp: 90 tablet, Rfl: 0   metoprolol succinate (TOPROL-XL) 50 MG 24 hr tablet, Take 1 tablet (50 mg total) by mouth daily. Take with or immediately following a meal., Disp: 90 tablet,  Rfl: 0   Multiple Vitamin (MULTIVITAMIN WITH MINERALS) TABS tablet, Take 1 tablet by mouth daily., Disp: , Rfl:    Probiotic Product (UP4 PROBIOTICS WOMENS PO), Take by mouth., Disp: , Rfl:   Allergies  Allergen Reactions   Pantoprazole     Pain in lef,t arm numbness and trouble moving arm     I personally reviewed active problem list, medication list, allergies, family history, social history, health maintenance with the patient/caregiver today.   ROS  Ten systems reviewed and is negative except as mentioned in HPI    Objective  Vitals:   03/03/23 0922  BP: 128/82  Pulse: 70  Resp: 14  Temp: 98 F (36.7 C)  TempSrc: Oral  SpO2: 98%  Weight: 198 lb 3.2 oz (89.9 kg)  Height: 5' (1.524 m)    Body mass index is 38.71 kg/m.  Physical Exam  Constitutional: Patient appears well-developed and well-nourished. Obese  No distress.  HEENT: head atraumatic, normocephalic, pupils equal and reactive to light, neck supple, throat within normal limits Cardiovascular: Normal rate, regular rhythm and normal heart sounds.  No murmur heard. No BLE edema. Pulmonary/Chest: Effort normal and breath sounds normal. No respiratory distress. Abdominal: Soft.  There is no tenderness. Psychiatric: Patient has a normal mood and affect. behavior is normal. Judgment and thought content normal.    PHQ2/9:    03/03/2023    9:19 AM 12/01/2022   10:32 AM 09/29/2022   11:32 AM 09/01/2022    9:53 AM 08/06/2022    9:47 AM  Depression screen PHQ 2/9  Decreased Interest 0 1 1 1  0  Down, Depressed, Hopeless 0 1 1 1  0  PHQ - 2 Score 0 2 2 2  0  Altered sleeping 1 1 1 2    Tired, decreased energy 1 1 1 3    Change in appetite 2 1 1 3    Feeling bad or failure about yourself  0 0 0 0   Trouble concentrating 1 1 1 1    Moving slowly or fidgety/restless 0 0 0 1   Suicidal thoughts 0 0 0 0   PHQ-9 Score 5 6 6 12    Difficult doing work/chores Not difficult at all Not difficult at all Somewhat difficult  Somewhat difficult     phq 9 is negative    Fall Risk:    03/03/2023    9:16 AM 12/01/2022   10:32 AM 09/29/2022   11:35 AM 08/06/2022    9:47 AM 02/28/2022   11:20 AM  Fall Risk   Falls in the past year? 0 0 0 0 0  Number falls in past yr:    0 0  Injury with Fall?    0 0  Risk for fall due to : No Fall Risks No Fall Risks No Fall Risks  No Fall Risks  Follow up Falls prevention discussed Falls prevention discussed Falls prevention discussed;Education provided;Falls evaluation completed Falls evaluation completed Falls prevention discussed      Functional Status Survey: Is the patient deaf or have difficulty hearing?: No Does the patient have difficulty seeing, even when wearing glasses/contacts?: No Does the patient have difficulty concentrating, remembering, or making decisions?: Yes Does the patient have difficulty walking or climbing stairs?: No Does the patient have difficulty dressing or bathing?: No Does the patient have difficulty doing errands alone such as visiting a doctor's office or shopping?: No    Assessment & Plan  1. PVC (premature ventricular contraction)  - metoprolol succinate (TOPROL-XL) 50 MG 24 hr tablet; Take 1 tablet (50 mg total) by mouth daily. Take with or immediately following a meal.  Dispense: 90 tablet; Refill: 0  2. Major depression, recurrent, chronic (HCC)  Doing better, currently stressing about her daughter   38. Hypertension, benign  - metoprolol succinate (TOPROL-XL) 50 MG 24 hr tablet; Take 1 tablet (50 mg total) by mouth daily. Take with or immediately following a meal.  Dispense: 90 tablet; Refill: 0 - hydrochlorothiazide (HYDRODIURIL) 12.5 MG tablet; Take 1 tablet (12.5 mg total) by mouth daily.  Dispense: 90 tablet; Refill: 0  4. Migraine without aura and without status migrainosus, not intractable  - metoprolol succinate (TOPROL-XL) 50 MG 24 hr tablet; Take 1 tablet (50 mg total) by mouth daily. Take with or immediately  following a meal.  Dispense: 90 tablet; Refill: 0  5. Pre-diabetes  She is trying to eat healthy   6. GAD (generalized anxiety disorder)   7. Vitamin D deficiency   Continue supplementation

## 2023-03-03 ENCOUNTER — Encounter: Payer: Self-pay | Admitting: Family Medicine

## 2023-03-03 ENCOUNTER — Ambulatory Visit: Payer: BC Managed Care – PPO | Admitting: Family Medicine

## 2023-03-03 VITALS — BP 128/82 | HR 70 | Temp 98.0°F | Resp 14 | Ht 60.0 in | Wt 198.2 lb

## 2023-03-03 DIAGNOSIS — F339 Major depressive disorder, recurrent, unspecified: Secondary | ICD-10-CM | POA: Diagnosis not present

## 2023-03-03 DIAGNOSIS — I10 Essential (primary) hypertension: Secondary | ICD-10-CM | POA: Diagnosis not present

## 2023-03-03 DIAGNOSIS — R7303 Prediabetes: Secondary | ICD-10-CM

## 2023-03-03 DIAGNOSIS — E559 Vitamin D deficiency, unspecified: Secondary | ICD-10-CM

## 2023-03-03 DIAGNOSIS — I493 Ventricular premature depolarization: Secondary | ICD-10-CM

## 2023-03-03 DIAGNOSIS — G43009 Migraine without aura, not intractable, without status migrainosus: Secondary | ICD-10-CM

## 2023-03-03 DIAGNOSIS — F411 Generalized anxiety disorder: Secondary | ICD-10-CM

## 2023-03-03 MED ORDER — METOPROLOL SUCCINATE ER 50 MG PO TB24: 50.0000 mg | ORAL_TABLET | Freq: Every day | ORAL | 0 refills | Status: DC

## 2023-03-03 MED ORDER — HYDROCHLOROTHIAZIDE 12.5 MG PO TABS: 12.5000 mg | ORAL_TABLET | Freq: Every day | ORAL | 0 refills | Status: DC

## 2023-03-12 DIAGNOSIS — R7309 Other abnormal glucose: Secondary | ICD-10-CM | POA: Diagnosis not present

## 2023-04-02 DIAGNOSIS — R0609 Other forms of dyspnea: Secondary | ICD-10-CM | POA: Diagnosis not present

## 2023-04-02 DIAGNOSIS — R072 Precordial pain: Secondary | ICD-10-CM | POA: Diagnosis not present

## 2023-04-12 DIAGNOSIS — R7309 Other abnormal glucose: Secondary | ICD-10-CM | POA: Diagnosis not present

## 2023-04-30 ENCOUNTER — Encounter: Payer: Self-pay | Admitting: Physician Assistant

## 2023-04-30 ENCOUNTER — Other Ambulatory Visit: Payer: Self-pay

## 2023-04-30 ENCOUNTER — Ambulatory Visit (INDEPENDENT_AMBULATORY_CARE_PROVIDER_SITE_OTHER): Payer: Self-pay | Admitting: Physician Assistant

## 2023-04-30 VITALS — BP 112/84 | HR 77 | Temp 98.5°F

## 2023-04-30 DIAGNOSIS — Z20822 Contact with and (suspected) exposure to covid-19: Secondary | ICD-10-CM

## 2023-04-30 LAB — POC COVID19 BINAXNOW: SARS Coronavirus 2 Ag: NEGATIVE

## 2023-04-30 NOTE — Progress Notes (Signed)
[facility] [address] [place], [place] [address]   Office Visit Note  Patient Name: Bethany [name] Date of Birth [id number]  Medical Record number [id number]  Date of Service: 04/30/2023  Chief Complaint  Patient presents with   Acute Visit    Patient c/o sneezing and mild sore throat x 2 days. Bethany [name] takes allergy medication daily. Bethany [name] states Bethany [name] gets frequent sinus infections this time of year but also reports that both of her daughters tested positive for COVID four days ago. Bethany [name] has not tested for COVID with a home test.     42 y/o F presents to the clinic for c/o congestion, sore throat, and headache x 2 days. +known exposure(kids at home) tested positive for covid 3 days ago. Hasn't been taking any medicines currently.       Current Medication:  Outpatient Encounter Medications as of 04/30/2023  Medication Sig   fexofenadine (ALLEGRA) 180 MG tablet Take 180 mg by mouth daily.   fluticasone (FLONASE) 50 MCG/ACT nasal spray Place 2 sprays into both nostrils daily. (Patient taking differently: Place 2 sprays into both nostrils daily as needed.)   hydrochlorothiazide (HYDRODIURIL) 12.5 MG tablet Take 1 tablet (12.5 mg total) by mouth daily.   metoprolol succinate (TOPROL-XL) 50 MG 24 hr tablet Take 1 tablet (50 mg total) by mouth daily. Take with or immediately following a meal.   Multiple Vitamin (MULTIVITAMIN WITH MINERALS) TABS tablet Take 1 tablet by mouth daily.   Probiotic Product (UP4 PROBIOTICS WOMENS PO) Take by mouth.   No facility-administered encounter medications on file as of 04/30/2023.      Medical History: Past Medical History:  Diagnosis Date   Diabetes mellitus without complication (HCC)    Hypertension      Vital Signs: BP 112/84   Pulse 77   Temp 98.5 F (36.9 C)   SpO2 98%    Review of Systems  Constitutional: Negative.   HENT:  Positive for congestion, sinus pressure, sinus pain and sore throat. Negative for postnasal drip and  trouble swallowing.   Respiratory: Negative.    Cardiovascular: Negative.   Neurological: Negative.     Physical Exam Constitutional:      Appearance: Normal appearance.  HENT:     Head: Atraumatic.     Right Ear: Tympanic membrane, ear canal and external ear normal.     Left Ear: Tympanic membrane, ear canal and external ear normal.     Nose: Nose normal.     Mouth/Throat:     Mouth: Mucous membranes are moist.     Pharynx: Oropharynx is clear.  Eyes:     Extraocular Movements: Extraocular movements intact.  Cardiovascular:     Rate and Rhythm: Normal rate and regular rhythm.  Pulmonary:     Effort: Pulmonary effort is normal.     Breath sounds: Normal breath sounds.  Musculoskeletal:     Cervical back: Neck supple.  Skin:    General: Skin is warm.  Neurological:     Mental Status: Bethany [name] is alert.  Psychiatric:        Mood and Affect: Mood normal.        Behavior: Behavior normal.        Thought Content: Thought content normal.        Judgment: Judgment normal.       Assessment/Plan:  1. Exposure to confirmed case of COVID-19 - POC COVID-19 BinaxNow  Reviewed negative covid test result with patient. Increase fluids Take otc multi-symptom  medicine  Continue to watch for worsening symptoms Re-test for covid at home if symptoms worsen. Pt verbalized understanding and in agreement.  Reviewed CDC recommendations with patient.  General Counseling: Bethany [name] verbalizes understanding of the findings of todays visit and agrees with plan of treatment. I have discussed any further diagnostic evaluation that may be needed or ordered today. We also reviewed her medications today. Bethany [name] has been encouraged to call the office with any questions or concerns that should arise related to todays visit.    Time spent:20 Minutes    Gilberto Better, [place] Physician Assistant

## 2023-05-12 DIAGNOSIS — R7309 Other abnormal glucose: Secondary | ICD-10-CM | POA: Diagnosis not present

## 2023-05-14 ENCOUNTER — Ambulatory Visit (INDEPENDENT_AMBULATORY_CARE_PROVIDER_SITE_OTHER): Payer: Self-pay | Admitting: Adult Health

## 2023-05-14 ENCOUNTER — Encounter: Payer: Self-pay | Admitting: Adult Health

## 2023-05-14 ENCOUNTER — Other Ambulatory Visit: Payer: Self-pay

## 2023-05-14 VITALS — BP 120/76 | HR 89 | Temp 98.6°F | Ht 60.0 in | Wt 191.0 lb

## 2023-05-14 DIAGNOSIS — M898X1 Other specified disorders of bone, shoulder: Secondary | ICD-10-CM

## 2023-05-14 DIAGNOSIS — R6884 Jaw pain: Secondary | ICD-10-CM

## 2023-05-14 MED ORDER — CYCLOBENZAPRINE HCL 10 MG PO TABS: 10.0000 mg | ORAL_TABLET | Freq: Three times a day (TID) | ORAL | 0 refills | Status: DC | PRN

## 2023-05-14 MED ORDER — CYCLOBENZAPRINE HCL 10 MG PO TABS
10.0000 mg | ORAL_TABLET | Freq: Every day | ORAL | 0 refills | Status: DC
Start: 2023-05-14 — End: 2023-06-04

## 2023-05-14 NOTE — Progress Notes (Signed)
[facility] [address] [place], [place] [address]   Office Visit Note  Patient Name: Bethany [name] Date of Birth [id number]  Medical Record number [id number]  Date of Service: 05/14/2023  Chief Complaint  Patient presents with   Acute Visit    Patient c/o L jaw and neck pain that radiates down into the L shoulder area. Symptoms began last week and have been present intermittently since then.      HPI Pt is here for a sick visit. Patient reports she woke up a few days ago with intense pain in her left jaw.  It lasted about 10-30 minutes.  This morning it returned for about 10 minutes.  She also has some pain in her left scapula.  She expresses some fatigue as well and reports "today just feels different."   She is also having low back pain, which is not new for her, but today is a little worse.  She reports they have been moving things for her job, and has been wearing a back pack last month.  She is also recently painted the whole inside of her house, and is now having to put furniture together.  She wore a holter monitor a few years ago for palpitations.  She reports a MVP, had Eccho's and states she was told she had an Irregular heart beat.  She [name]. Was seen a month ago, and they recommended a Stress test (scheduled oct 10th).    Current Medication:  Outpatient Encounter Medications as of 05/14/2023  Medication Sig   cyclobenzaprine (FLEXERIL) 10 MG tablet Take 1 tablet (10 mg total) by mouth at bedtime.   fexofenadine (ALLEGRA) 180 MG tablet Take 180 mg by mouth daily.   fluticasone (FLONASE) 50 MCG/ACT nasal spray Place 2 sprays into both nostrils daily. (Patient taking differently: Place 2 sprays into both nostrils daily as needed.)   hydrochlorothiazide (HYDRODIURIL) 12.5 MG tablet Take 1 tablet (12.5 mg total) by mouth daily.   metoprolol succinate (TOPROL-XL) 50 MG 24 hr tablet Take 1 tablet (50 mg total) by mouth daily. Take with or immediately  following a meal.   Multiple Vitamin (MULTIVITAMIN WITH MINERALS) TABS tablet Take 1 tablet by mouth daily.   Probiotic Product (UP4 PROBIOTICS WOMENS PO) Take by mouth.   [DISCONTINUED] cyclobenzaprine (FLEXERIL) 10 MG tablet Take 1 tablet (10 mg total) by mouth 3 (three) times daily as needed for muscle spasms.   No facility-administered encounter medications on file as of 05/14/2023.      Medical History: Past Medical History:  Diagnosis Date   Diabetes mellitus without complication (HCC)    Hypertension      Vital Signs: BP 120/76   Pulse 89   Temp 98.6 F (37 C)   Ht 5' (1.524 m)   Wt 191 lb (86.6 kg)   SpO2 99%   BMI 37.30 kg/m    Review of Systems  Constitutional:  Negative for chills, diaphoresis and fever.  HENT:  Negative for ear pain.   Eyes:  Negative for pain and itching.  Respiratory:  Negative for cough.   Cardiovascular:  Negative for chest pain.  Gastrointestinal:  Negative for diarrhea, nausea and vomiting.  Musculoskeletal:  Positive for back pain. Negative for myalgias.    Physical Exam Constitutional:      Appearance: Normal appearance.  HENT:     Head: Normocephalic.     Right Ear: Tympanic membrane and ear canal normal.     Left Ear: Tympanic  membrane and ear canal normal.     Nose: Nose normal.     Mouth/Throat:     Mouth: Mucous membranes are moist.  Eyes:     Pupils: Pupils are equal, round, and reactive to light.  Cardiovascular:     Rate and Rhythm: Normal rate and regular rhythm.     Pulses: Normal pulses.  Pulmonary:     Effort: Pulmonary effort is normal.     Breath sounds: Normal breath sounds.  Musculoskeletal:     Comments: Reproducible pain in cervical spine, and across left scapula.  No sternal pain with palpation.   Lymphadenopathy:     Cervical: No cervical adenopathy.  Neurological:     Mental Status: She is alert.    Assessment/Plan: 1. Pain of left scapula Take flexeril at night before bed.  Avoid alcohol or  use of other sedating medications.  - cyclobenzaprine (FLEXERIL) 10 MG tablet; Take 1 tablet (10 mg total) by mouth at bedtime.  Dispense: 20 tablet; Refill: 0  2. Jaw pain NSR on read.  - EKG 12-Lead        General Counseling: Bethany [name] verbalizes understanding of the findings of todays visit and agrees with plan of treatment. I have discussed any further diagnostic evaluation that may be needed or ordered today. We also reviewed her medications today. she has been encouraged to call the office with any questions or concerns that should arise related to todays visit.   Orders Placed This Encounter  Procedures   EKG 12-Lead    Meds ordered this encounter  Medications   DISCONTD: cyclobenzaprine (FLEXERIL) 10 MG tablet    Sig: Take 1 tablet (10 mg total) by mouth 3 (three) times daily as needed for muscle spasms.    Dispense:  20 tablet    Refill:  0   cyclobenzaprine (FLEXERIL) 10 MG tablet    Sig: Take 1 tablet (10 mg total) by mouth at bedtime.    Dispense:  20 tablet    Refill:  0    Updated Instructions from previous RX    Time spent:20 Minutes    Johnna Acosta AGNP-C Nurse Practitioner

## 2023-05-19 DIAGNOSIS — E669 Obesity, unspecified: Secondary | ICD-10-CM | POA: Diagnosis not present

## 2023-05-21 DIAGNOSIS — R002 Palpitations: Secondary | ICD-10-CM | POA: Diagnosis not present

## 2023-05-21 DIAGNOSIS — I341 Nonrheumatic mitral (valve) prolapse: Secondary | ICD-10-CM | POA: Diagnosis not present

## 2023-05-24 DIAGNOSIS — R002 Palpitations: Secondary | ICD-10-CM | POA: Diagnosis not present

## 2023-05-24 DIAGNOSIS — I341 Nonrheumatic mitral (valve) prolapse: Secondary | ICD-10-CM | POA: Diagnosis not present

## 2023-05-26 ENCOUNTER — Encounter: Payer: Self-pay | Admitting: Family Medicine

## 2023-05-26 ENCOUNTER — Ambulatory Visit: Payer: BC Managed Care – PPO | Admitting: Family Medicine

## 2023-05-26 ENCOUNTER — Other Ambulatory Visit (HOSPITAL_COMMUNITY)
Admission: RE | Admit: 2023-05-26 | Discharge: 2023-05-26 | Disposition: A | Discharge status: Home / Self Care | Payer: BC Managed Care – PPO | Source: Ambulatory Visit | Attending: Family Medicine | Admitting: Family Medicine

## 2023-05-26 VITALS — BP 126/72 | HR 85 | Resp 16 | Ht 60.0 in | Wt 199.0 lb

## 2023-05-26 DIAGNOSIS — N898 Other specified noninflammatory disorders of vagina: Secondary | ICD-10-CM | POA: Insufficient documentation

## 2023-05-26 DIAGNOSIS — R102 Pelvic and perineal pain: Secondary | ICD-10-CM | POA: Diagnosis not present

## 2023-05-26 DIAGNOSIS — Z113 Encounter for screening for infections with a predominantly sexual mode of transmission: Secondary | ICD-10-CM | POA: Insufficient documentation

## 2023-05-26 DIAGNOSIS — N9489 Other specified conditions associated with female genital organs and menstrual cycle: Secondary | ICD-10-CM

## 2023-05-26 LAB — POCT URINALYSIS DIPSTICK
Appearance: NORMAL
Bilirubin, UA: NEGATIVE
Blood, UA: NEGATIVE
Glucose, UA: NEGATIVE
Ketones, UA: NEGATIVE
Leukocytes, UA: NEGATIVE
Nitrite, UA: NEGATIVE
Protein, UA: NEGATIVE
Spec Grav, UA: 1.015 (ref 1.010–1.025)
Urobilinogen, UA: 0.2 U/dL
pH, UA: 6 (ref 5.0–8.0)

## 2023-05-26 MED ORDER — FLUCONAZOLE 150 MG PO TABS
150.0000 mg | ORAL_TABLET | ORAL | 0 refills | Status: DC | PRN
Start: 2023-05-26 — End: 2023-06-04

## 2023-05-26 NOTE — Progress Notes (Signed)
Patient ID: Bethany [name], female    DOB: Jul 29, 1981, 42 y.o.   MRN: [id number]  PCP: Alba Cory, MD  Chief Complaint  Patient presents with   Vaginitis    Subjective:   Bethany [name] is a 42 y.o. female, presents to clinic with CC of the following:  Vaginal Discharge The patient's primary symptoms include genital itching and vaginal discharge. The patient's pertinent negatives include no vaginal bleeding.  Vaginal Itching The patient's primary symptoms include genital itching and vaginal discharge. The patient's pertinent negatives include no vaginal bleeding. Nothing aggravates the symptoms.  Patient reports vaginal irritation and itching with white discharge She has had BV and yeast infections in the past and she is not sure what it might be She denies any recent new sexual partners or pain with sex, denies abdominal pain, flank pain, nausea, vomiting, urinary frequency urgency Some mild discomfort to suprapubic area     Patient Active Problem List   Diagnosis Date Noted   Palpitations 08/23/2020   Mitral valve prolapse 01/11/2019   Hypertension, benign 09/14/2018   Chronic left hip pain 12/04/2017   Obesity, Class II, BMI 35-39.9 12/04/2017   Abnormal finding on MRI of brain 11/05/2017   Mild major depression (HCC) 09/11/2017   Patellofemoral stress syndrome of both knees 08/09/2014      Current Outpatient Medications:    cyclobenzaprine (FLEXERIL) 10 MG tablet, Take 1 tablet (10 mg total) by mouth at bedtime., Disp: 20 tablet, Rfl: 0   fexofenadine (ALLEGRA) 180 MG tablet, Take 180 mg by mouth daily., Disp: , Rfl:    fluticasone (FLONASE) 50 MCG/ACT nasal spray, Place 2 sprays into both nostrils daily. (Patient taking differently: Place 2 sprays into both nostrils daily as needed.), Disp: 48 g, Rfl: 1   hydrochlorothiazide (HYDRODIURIL) 12.5 MG tablet, Take 1 tablet (12.5 mg total) by mouth daily., Disp: 90 tablet, Rfl: 0   metoprolol succinate  (TOPROL-XL) 50 MG 24 hr tablet, Take 1 tablet (50 mg total) by mouth daily. Take with or immediately following a meal., Disp: 90 tablet, Rfl: 0   Multiple Vitamin (MULTIVITAMIN WITH MINERALS) TABS tablet, Take 1 tablet by mouth daily., Disp: , Rfl:    Probiotic Product (UP4 PROBIOTICS WOMENS PO), Take by mouth., Disp: , Rfl:    Allergies  Allergen Reactions   Pantoprazole     Pain in lef,t arm numbness and trouble moving arm      Social History   Tobacco Use   Smoking status: Never   Smokeless tobacco: Never  Vaping Use   Vaping status: Never Used  Substance Use Topics   Alcohol use: Not Currently    Alcohol/week: 0.0 standard drinks of alcohol    Comment: occasionally   Drug use: No      Chart Review Today: I personally reviewed active problem list, medication list, allergies, family history, social history, health maintenance, notes from last encounter, lab results, imaging with the patient/caregiver today.   Review of Systems  Constitutional: Negative.   HENT: Negative.    Eyes: Negative.   Respiratory: Negative.    Cardiovascular: Negative.   Gastrointestinal: Negative.   Endocrine: Negative.   Genitourinary:  Positive for vaginal discharge.  Musculoskeletal: Negative.   Skin: Negative.   Allergic/Immunologic: Negative.   Neurological: Negative.   Hematological: Negative.   Psychiatric/Behavioral: Negative.    All other systems reviewed and are negative.      Objective:   Vitals:   05/26/23 1420  BP: 126/72  Pulse: 85  Resp: 16  SpO2: 100%  Weight: 199 lb (90.3 kg)  Height: 5' (1.524 m)    Body mass index is 38.86 kg/m.  Physical Exam Vitals and nursing note reviewed.  Constitutional:      General: She is not in acute distress.    Appearance: Normal appearance. She is well-developed and well-groomed. She is obese. She is not ill-appearing or toxic-appearing.  HENT:     Head: Normocephalic and atraumatic.     Nose: Nose normal.  Eyes:      General:        Right eye: No discharge.        Left eye: No discharge.     Conjunctiva/sclera: Conjunctivae normal.  Neck:     Trachea: No tracheal deviation.  Cardiovascular:     Rate and Rhythm: Normal rate and regular rhythm.  Pulmonary:     Effort: Pulmonary effort is normal. No respiratory distress.     Breath sounds: No stridor.  Abdominal:     General: Bowel sounds are normal. There is no distension.     Palpations: Abdomen is soft.     Tenderness: There is abdominal tenderness in the suprapubic area. There is no right CVA tenderness, left CVA tenderness or guarding.     Comments: Very mild ttp to suprapubic area, no guarding or rebound  Skin:    General: Skin is warm and dry.     Findings: No rash.  Neurological:     Mental Status: She is alert.     Motor: No abnormal muscle tone.     Coordination: Coordination normal.  Psychiatric:        Mood and Affect: Mood normal.        Behavior: Behavior normal. Behavior is cooperative.      Results for orders placed or performed in visit on 04/30/23  POC COVID-19 BinaxNow  Result Value Ref Range   SARS Coronavirus 2 Ag Negative Negative       Assessment & Plan:   1. Vaginal itching She would like to try one dose of diflucan while waiting for cytology Other tx pending results - Cervicovaginal ancillary only - fluconazole (DIFLUCAN) 150 MG tablet; Take 1 tablet (150 mg total) by mouth every 3 (three) days as needed (for vaginal itching/yeast infection sx).  Dispense: 2 tablet; Refill: 0  2. Vaginal burning See #1 - POCT Urinalysis Dipstick - CULTURE, URINE COMPREHENSIVE  3. Suprapubic pain Over bladder, will send out urine testing Dip today is unremarkable and she is not having frequency, urgency, or hematuria - will wait for urine culture  - Urine Culture - CULTURE, URINE COMPREHENSIVE   if BV pt will try metrogel   Danelle Berry, PA-C 05/26/23 1:59 PM

## 2023-05-28 ENCOUNTER — Other Ambulatory Visit: Payer: Self-pay | Admitting: Family Medicine

## 2023-05-28 ENCOUNTER — Encounter: Payer: Self-pay | Admitting: Family Medicine

## 2023-05-28 DIAGNOSIS — B9689 Other specified bacterial agents as the cause of diseases classified elsewhere: Secondary | ICD-10-CM

## 2023-05-28 DIAGNOSIS — A599 Trichomoniasis, unspecified: Secondary | ICD-10-CM

## 2023-05-28 LAB — CULTURE, URINE COMPREHENSIVE
MICRO NUMBER:: 15598313
SPECIMEN QUALITY:: ADEQUATE

## 2023-05-28 LAB — CERVICOVAGINAL ANCILLARY ONLY
Bacterial Vaginitis (gardnerella): POSITIVE — AB
Candida Glabrata: NEGATIVE
Candida Vaginitis: NEGATIVE
Chlamydia: NEGATIVE
Comment: NEGATIVE
Comment: NEGATIVE
Comment: NEGATIVE
Comment: NEGATIVE
Comment: NEGATIVE
Comment: NORMAL
Neisseria Gonorrhea: NEGATIVE
Trichomonas: POSITIVE — AB

## 2023-05-28 MED ORDER — METRONIDAZOLE 500 MG PO TABS
500.0000 mg | ORAL_TABLET | Freq: Two times a day (BID) | ORAL | 0 refills | Status: AC
Start: 2023-05-28 — End: 2023-06-04

## 2023-06-03 NOTE — Progress Notes (Unsigned)
Name: Bethany [name]   MRN: [id number]    DOB: 12/01/1980   Date:06/04/2023       Progress Note  Subjective  Chief Complaint  Follow Up  HPI  MDD/Anxiety: she tried medication Duloxetine, Prozac and hydroxyzine but had side effects and stopped it on her own.  She is currently in therapy, son is going to [employer] and adjusting well, grandson moved to [place] and daughter is the only one at home but she is working part time. She is doing well. 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 She has noticed difficulty focusing.   Pre-diabetes/Obesity : she is changing positions at work, she will be more active and less access to junk food. Last A1C was up to 5.8 % and will monitor. Denies polyphagia, polydipsia or polyuria . She has been doing a walking challenge with a co-worker   Migraine and left arm numbness:She was seeing neurologist - Dr. Sherryll Burger -  she stopped taking magnesium but still takes MVI .   He has intermittent arm numbness , explained it may be secondary to neck issues . Advised to see Dr. Sherryll Burger again, also having balance issues - when walking and sometimes when driving feels off.   Perennial Allergic: taking flonase and allegra, doing well on medication. Unchanged     PVC/MVP/HTN: she saw [facility] cardiologist  and had Echo and EKG, she is on Metoprolol and hydrochlorothiazide now , she denies recent episodes of palpitation She still has some SOB when walking for a long time. She is under the care of cardiologist, bp is at goal today , she was recently seen and is due for stress test but could not afford it at the time.   Echo   Normal left ventricular systolic function, ejection fraction > 55%  Mitral valve prolapse - moderate  Mitral regurgitation - mild  Normal right ventricular systolic function   Holter event monitor ( June 2020 )    Patient had a min HR of 36 bpm, max HR of 154 bpm, and avg HR of 72 bpm. Predominant underlying  rhythm was Sinus Rhythm. Isolated SVEs were rare (<1.0%), SVE Couplets were rare (<1.0%), and no SVE Triplets were present. Isolated VEs were occasional (1.3%, 18369), VE Couplets were rare (<1.0%, 39), and VE Triplets were rare (<1.0%, 4). Ventricular Bigeminy and Trigeminy were present.  One patient triggered event was associated with sinus rhythm.  Patient Active Problem List   Diagnosis Date Noted   Moderate major depression (HCC) 06/04/2023   Palpitations 08/23/2020   Mitral valve prolapse 01/11/2019   Hypertension, benign 09/14/2018   Chronic left hip pain 12/04/2017   Obesity, Class II, BMI 35-39.9 12/04/2017   Abnormal finding on MRI of brain 11/05/2017   Mild major depression (HCC) 09/11/2017   Patellofemoral stress syndrome of both knees 08/09/2014    Past Surgical History:  Procedure Laterality Date   CESAREAN SECTION  2002   CESAREAN SECTION  2005    Family History  Problem Relation Age of Onset   Emphysema Mother    Hypercalcemia Mother    Hypertension Mother    Sickle cell trait Mother    Clotting disorder Mother    Prostate cancer Father    Diabetes Mellitus II Father    Hypertension Father    Heart Problems Father        Enlarged Heart   Stroke Maternal Grandmother    Heart failure Maternal Grandmother  Emphysema Maternal Grandfather    Lung cancer Maternal Aunt    Breast cancer Maternal Aunt        early 60's   Lung cancer Maternal Uncle    Kidney disease Paternal Aunt    Seizures Half-Sister    Thyroid disease Half-Sister     Social History   Tobacco Use   Smoking status: Never   Smokeless tobacco: Never  Substance Use Topics   Alcohol use: Not Currently    Alcohol/week: 0.0 standard drinks of alcohol    Comment: occasionally     Current Outpatient Medications:    fexofenadine (ALLEGRA) 180 MG tablet, Take 180 mg by mouth daily., Disp: , Rfl:    fluticasone (FLONASE) 50 MCG/ACT nasal spray, Place 2 sprays into both nostrils daily.  (Patient taking differently: Place 2 sprays into both nostrils daily as needed.), Disp: 48 g, Rfl: 1   hydrochlorothiazide (HYDRODIURIL) 12.5 MG tablet, Take 1 tablet (12.5 mg total) by mouth daily., Disp: 90 tablet, Rfl: 0   metoprolol succinate (TOPROL-XL) 50 MG 24 hr tablet, Take 1 tablet (50 mg total) by mouth daily. Take with or immediately following a meal., Disp: 90 tablet, Rfl: 0   metroNIDAZOLE (FLAGYL) 500 MG tablet, Take 1 tablet (500 mg total) by mouth 2 (two) times daily for 7 days., Disp: 14 tablet, Rfl: 0   Multiple Vitamin (MULTIVITAMIN WITH MINERALS) TABS tablet, Take 1 tablet by mouth daily., Disp: , Rfl:    Probiotic Product (UP4 PROBIOTICS WOMENS PO), Take by mouth., Disp: , Rfl:   Allergies  Allergen Reactions   Pantoprazole     Pain in lef,t arm numbness and trouble moving arm     I personally reviewed active problem list, medication list, allergies, family history, social history, health maintenance with the patient/caregiver today.   ROS  Ten systems reviewed and is negative except as mentioned in HPI    Objective  Vitals:   06/04/23 1011  BP: 122/72  Pulse: 80  Resp: 16  SpO2: 99%  Weight: 197 lb (89.4 kg)  Height: 5' (1.524 m)    Body mass index is 38.47 kg/m.  Physical Exam  Constitutional: Patient appears well-developed and well-nourished. Obese  No distress.  HEENT: head atraumatic, normocephalic, pupils equal and reactive to light, neck supple Cardiovascular: Normal rate, regular rhythm and normal heart sounds.  No murmur heard. No BLE edema. Pulmonary/Chest: Effort normal and breath sounds normal. No respiratory distress. Abdominal: Soft.  There is no tenderness. Psychiatric: Patient has a normal mood and affect. behavior is normal. Judgment and thought content normal.    PHQ2/9:    06/04/2023   10:11 AM 05/26/2023    2:16 PM 03/03/2023    9:19 AM 12/01/2022   10:32 AM 09/29/2022   11:32 AM  Depression screen PHQ 2/9  Decreased  Interest 0 0 0 1 1  Down, Depressed, Hopeless 0 0 0 1 1  PHQ - 2 Score 0 0 0 2 2  Altered sleeping 1  1 1 1   Tired, decreased energy 1  1 1 1   Change in appetite 1  2 1 1   Feeling bad or failure about yourself  0  0 0 0  Trouble concentrating 1  1 1 1   Moving slowly or fidgety/restless 0  0 0 0  Suicidal thoughts 0  0 0 0  PHQ-9 Score 4  5 6 6   Difficult doing work/chores   Not difficult at all Not difficult at all Somewhat difficult  phq 9 is negative   Fall Risk:    06/04/2023   10:11 AM 05/26/2023    2:16 PM 03/03/2023    9:16 AM 12/01/2022   10:32 AM 09/29/2022   11:35 AM  Fall Risk   Falls in the past year? 0 0 0 0 0  Number falls in past yr: 0 0     Injury with Fall? 0 0     Risk for fall due to : No Fall Risks No Fall Risks No Fall Risks No Fall Risks No Fall Risks  Follow up Falls prevention discussed Falls prevention discussed Falls prevention discussed Falls prevention discussed Falls prevention discussed;Education provided;Falls evaluation completed      Functional Status Survey: Is the patient deaf or have difficulty hearing?: No Does the patient have difficulty seeing, even when wearing glasses/contacts?: No Does the patient have difficulty concentrating, remembering, or making decisions?: No Does the patient have difficulty walking or climbing stairs?: No Does the patient have difficulty dressing or bathing?: No Does the patient have difficulty doing errands alone such as visiting a doctor's office or shopping?: No    Assessment & Plan  1. Major depression in remission [facility])  Doing well   2. Hypertension, benign  - hydrochlorothiazide (HYDRODIURIL) 12.5 MG tablet; Take 1 tablet (12.5 mg total) by mouth daily.  Dispense: 90 tablet; Refill: 1 - metoprolol succinate (TOPROL-XL) 50 MG 24 hr tablet; Take 1 tablet (50 mg total) by mouth daily. Take with or immediately following a meal.  Dispense: 90 tablet; Refill: 1  3. PVC (premature ventricular  contraction)  - metoprolol succinate (TOPROL-XL) 50 MG 24 hr tablet; Take 1 tablet (50 mg total) by mouth daily. Take with or immediately following a meal.  Dispense: 90 tablet; Refill: 1  4. Migraine without aura and without status migrainosus, not intractable  - metoprolol succinate (TOPROL-XL) 50 MG 24 hr tablet; Take 1 tablet (50 mg total) by mouth daily. Take with or immediately following a meal.  Dispense: 90 tablet; Refill: 1  5. GAD (generalized anxiety disorder)   6. Vitamin D deficiency  Resume vitamin d otc   8. Cervical radiculitis  - DG Cervical Spine Complete; Future - celecoxib (CELEBREX) 100 MG capsule; Take 1 capsule (100 mg total) by mouth 2 (two) times daily as needed.  Dispense: 180 capsule; Refill: 0

## 2023-06-04 ENCOUNTER — Ambulatory Visit (INDEPENDENT_AMBULATORY_CARE_PROVIDER_SITE_OTHER): Payer: BC Managed Care – PPO | Admitting: Family Medicine

## 2023-06-04 ENCOUNTER — Encounter: Payer: Self-pay | Admitting: Family Medicine

## 2023-06-04 ENCOUNTER — Ambulatory Visit: Payer: BC Managed Care – PPO | Admitting: Family Medicine

## 2023-06-04 VITALS — BP 122/72 | HR 80 | Resp 16 | Ht 60.0 in | Wt 197.0 lb

## 2023-06-04 DIAGNOSIS — I493 Ventricular premature depolarization: Secondary | ICD-10-CM | POA: Insufficient documentation

## 2023-06-04 DIAGNOSIS — G43009 Migraine without aura, not intractable, without status migrainosus: Secondary | ICD-10-CM | POA: Insufficient documentation

## 2023-06-04 DIAGNOSIS — E559 Vitamin D deficiency, unspecified: Secondary | ICD-10-CM | POA: Insufficient documentation

## 2023-06-04 DIAGNOSIS — F325 Major depressive disorder, single episode, in full remission: Secondary | ICD-10-CM

## 2023-06-04 DIAGNOSIS — I10 Essential (primary) hypertension: Secondary | ICD-10-CM

## 2023-06-04 DIAGNOSIS — R7303 Prediabetes: Secondary | ICD-10-CM | POA: Insufficient documentation

## 2023-06-04 DIAGNOSIS — F411 Generalized anxiety disorder: Secondary | ICD-10-CM | POA: Insufficient documentation

## 2023-06-04 DIAGNOSIS — M5412 Radiculopathy, cervical region: Secondary | ICD-10-CM | POA: Insufficient documentation

## 2023-06-04 DIAGNOSIS — F321 Major depressive disorder, single episode, moderate: Secondary | ICD-10-CM | POA: Insufficient documentation

## 2023-06-04 MED ORDER — METOPROLOL SUCCINATE ER 50 MG PO TB24
50.0000 mg | ORAL_TABLET | Freq: Every day | ORAL | 1 refills | Status: DC
Start: 2023-06-04 — End: 2023-11-10

## 2023-06-04 MED ORDER — HYDROCHLOROTHIAZIDE 12.5 MG PO TABS
12.5000 mg | ORAL_TABLET | Freq: Every day | ORAL | 1 refills | Status: DC
Start: 2023-06-04 — End: 2023-12-01

## 2023-06-04 MED ORDER — CELECOXIB 100 MG PO CAPS
100.0000 mg | ORAL_CAPSULE | Freq: Two times a day (BID) | ORAL | 0 refills | Status: AC | PRN
Start: 2023-06-04 — End: ?

## 2023-06-12 DIAGNOSIS — R7309 Other abnormal glucose: Secondary | ICD-10-CM | POA: Diagnosis not present

## 2023-06-25 ENCOUNTER — Encounter: Payer: Self-pay | Admitting: Family Medicine

## 2023-07-01 DIAGNOSIS — E669 Obesity, unspecified: Secondary | ICD-10-CM | POA: Diagnosis not present

## 2023-07-12 DIAGNOSIS — R7309 Other abnormal glucose: Secondary | ICD-10-CM | POA: Diagnosis not present

## 2023-08-12 ENCOUNTER — Encounter: Payer: Self-pay | Admitting: Family Medicine

## 2023-08-12 DIAGNOSIS — R7309 Other abnormal glucose: Secondary | ICD-10-CM | POA: Diagnosis not present

## 2023-08-14 NOTE — Telephone Encounter (Signed)
 The patient has made an additional phone call regarding their vitamin d concerns   Please contact when available

## 2023-08-20 ENCOUNTER — Encounter: Payer: Self-pay | Admitting: Family Medicine

## 2023-09-05 ENCOUNTER — Encounter: Payer: Self-pay | Admitting: Family Medicine

## 2023-09-12 DIAGNOSIS — R7309 Other abnormal glucose: Secondary | ICD-10-CM | POA: Diagnosis not present

## 2023-09-14 ENCOUNTER — Encounter: Payer: Self-pay | Admitting: Family Medicine

## 2023-09-17 ENCOUNTER — Ambulatory Visit: Payer: BC Managed Care – PPO | Admitting: Family Medicine

## 2023-09-18 ENCOUNTER — Other Ambulatory Visit (HOSPITAL_COMMUNITY)
Admission: RE | Admit: 2023-09-18 | Discharge: 2023-09-18 | Disposition: A | Discharge status: Home / Self Care | Payer: BC Managed Care – PPO | Source: Ambulatory Visit | Attending: Family Medicine | Admitting: Family Medicine

## 2023-09-18 ENCOUNTER — Ambulatory Visit: Payer: BC Managed Care – PPO | Admitting: Family Medicine

## 2023-09-18 ENCOUNTER — Encounter: Payer: Self-pay | Admitting: Family Medicine

## 2023-09-18 VITALS — BP 124/72 | HR 93 | Resp 16 | Ht 60.0 in | Wt 199.6 lb

## 2023-09-18 DIAGNOSIS — R1013 Epigastric pain: Secondary | ICD-10-CM | POA: Diagnosis not present

## 2023-09-18 DIAGNOSIS — G4709 Other insomnia: Secondary | ICD-10-CM

## 2023-09-18 DIAGNOSIS — R110 Nausea: Secondary | ICD-10-CM | POA: Diagnosis not present

## 2023-09-18 DIAGNOSIS — N898 Other specified noninflammatory disorders of vagina: Secondary | ICD-10-CM | POA: Insufficient documentation

## 2023-09-18 DIAGNOSIS — R200 Anesthesia of skin: Secondary | ICD-10-CM

## 2023-09-18 MED ORDER — HYDROXYZINE HCL 10 MG PO TABS: 10.0000 mg | ORAL_TABLET | Freq: Every evening | ORAL | 0 refills | Status: DC

## 2023-09-18 MED ORDER — FAMOTIDINE 40 MG PO TABS: 40.0000 mg | ORAL_TABLET | Freq: Every day | ORAL | 0 refills | Status: DC

## 2023-09-18 NOTE — Progress Notes (Signed)
 Name: Bethany [name]   MRN: [id number]    DOB: 1980-12-04   Date:09/18/2023       Progress Note  Subjective  Chief Complaint  Chief Complaint  Patient presents with   vaginal odor    Musty scent onset for weeks   Abdominal Pain    Epigastric area cramping started today   Discussed the use of AI scribe software for clinical note transcription with the patient, who gave verbal consent to proceed.  History of Present Illness   Bethany [name] is a 43 year old female who presents with vaginal odor and abdominal pain.  She has been experiencing a musty vaginal odor for a few weeks. She recently changed her soap due to unavailability of her usual product and suspects this might be related to the odor. No new sexual partners and denies vaginal discharge. LMP was about one weeks ago. She is uncertain if the odor is from urine or the vagina and notes inadequate water intake. The odor is intermittent, with no burning or blood in the urine.  Abdominal pain started today but has been occurring intermittently for the past few months. The pain is epigastric and cramping, without a clear trigger. She notes irregular eating habits and today's pain occurred after eating chicken and coleslaw. She experiences chronic nausea, both with and without food, and denies any burning sensation. No history of surgeries.  Chronic left arm numbness has been present since 2020. She recalls a neurology referral for migraines and left arm numbness but did not follow up. No recent x-rays of her neck and reports occasional dropping of items with her left hand. She has not been taking Celebrex , which was prescribed for cervical radiculitis.  She has been struggling with weight loss and sleep issues, reporting only 2 to 4 hours of sleep per night for the past month. She has not taken any medication for sleep and is unsure of the cause of her sleep difficulties, but mind is busy and she worries when she goes to bed.        Patient Active Problem List   Diagnosis Date Noted   Moderate major depression (HCC) 06/04/2023   GAD (generalized anxiety disorder) 06/04/2023   Vitamin D  deficiency 06/04/2023   Migraine without aura and without status migrainosus, not intractable 06/04/2023   Pre-diabetes 06/04/2023   PVC (premature ventricular contraction) 06/04/2023   Cervical radiculitis 06/04/2023   Palpitations 08/23/2020   Mitral valve prolapse 01/11/2019   Hypertension, benign 09/14/2018   Chronic left hip pain 12/04/2017   Obesity, Class II, BMI 35-39.9 12/04/2017   Abnormal finding on MRI of brain 11/05/2017   Mild major depression (HCC) 09/11/2017   Patellofemoral stress syndrome of both knees 08/09/2014    Past Surgical History:  Procedure Laterality Date   CESAREAN SECTION  2002   CESAREAN SECTION  2005    Family History  Problem Relation Age of Onset   Emphysema Mother    Hypercalcemia Mother    Hypertension Mother    Sickle cell trait Mother    Clotting disorder Mother    Prostate cancer Father    Diabetes Mellitus II Father    Hypertension Father    Heart Problems Father        Enlarged Heart   Stroke Maternal Grandmother    Heart failure Maternal Grandmother    Emphysema Maternal Grandfather    Lung cancer Maternal Aunt    Breast cancer Maternal Aunt        early 18's  Lung cancer Maternal Uncle    Kidney disease Paternal Aunt    Seizures Half-Sister    Thyroid  disease Half-Sister     Social History   Tobacco Use   Smoking status: Never   Smokeless tobacco: Never  Substance Use Topics   Alcohol use: Not Currently    Alcohol/week: 0.0 standard drinks of alcohol    Comment: occasionally     Current Outpatient Medications:    celecoxib  (CELEBREX ) 100 MG capsule, Take 1 capsule (100 mg total) by mouth 2 (two) times daily as needed., Disp: 180 capsule, Rfl: 0   Cholecalciferol (VITAMIN D ) 50 MCG (2000 UT) CAPS, Take 1 capsule by mouth daily., Disp: , Rfl:     fexofenadine (ALLEGRA) 180 MG tablet, Take 180 mg by mouth daily., Disp: , Rfl:    fluticasone  (FLONASE ) 50 MCG/ACT nasal spray, Place 2 sprays into both nostrils daily. (Patient taking differently: Place 2 sprays into both nostrils daily as needed.), Disp: 48 g, Rfl: 1   hydrochlorothiazide  (HYDRODIURIL ) 12.5 MG tablet, Take 1 tablet (12.5 mg total) by mouth daily., Disp: 90 tablet, Rfl: 1   metoprolol  succinate (TOPROL -XL) 50 MG 24 hr tablet, Take 1 tablet (50 mg total) by mouth daily. Take with or immediately following a meal., Disp: 90 tablet, Rfl: 1   Multiple Vitamin (MULTIVITAMIN WITH MINERALS) TABS tablet, Take 1 tablet by mouth daily., Disp: , Rfl:    Probiotic Product (UP4 PROBIOTICS WOMENS PO), Take by mouth., Disp: , Rfl:   Allergies  Allergen Reactions   Pantoprazole      Pain in lef,t arm numbness and trouble moving arm     I personally reviewed active problem list, medication list, allergies, family history with the patient/caregiver today.   ROS  Ten systems reviewed and is negative except as mentioned in HPI    Objective  Vitals:   09/18/23 1533  BP: 124/72  Pulse: 93  Resp: 16  SpO2: 97%  Weight: 199 lb 9.6 oz (90.5 kg)  Height: 5' (1.524 m)    Body mass index is 38.98 kg/m.  Physical Exam  Constitutional: Patient appears well-developed and well-nourished. Obese  No distress.  HEENT: head atraumatic, normocephalic, pupils equal and reactive to light,, neck supple Cardiovascular: Normal rate, regular rhythm and normal heart sounds.  No murmur heard. No BLE edema. Pulmonary/Chest: Effort normal and breath sounds normal. No respiratory distress. Abdominal: Soft.  There is no epigastric  tenderness.tender during palpation of supra pubic area Psychiatric: Patient has a normal mood and affect. behavior is normal. Judgment and thought content normal.   Diabetic Foot Exam:     PHQ2/9:    09/18/2023    3:28 PM 06/04/2023   10:11 AM 05/26/2023    2:16 PM  03/03/2023    9:19 AM 12/01/2022   10:32 AM  Depression screen PHQ 2/9  Decreased Interest 0 0 0 0 1  Down, Depressed, Hopeless 0 0 0 0 1  PHQ - 2 Score 0 0 0 0 2  Altered sleeping 0 1  1 1   Tired, decreased energy 0 1  1 1   Change in appetite 0 1  2 1   Feeling bad or failure about yourself  0 0  0 0  Trouble concentrating 0 1  1 1   Moving slowly or fidgety/restless 0 0  0 0  Suicidal thoughts 0 0  0 0  PHQ-9 Score 0 4  5 6   Difficult doing work/chores Not difficult at all   Not difficult at all  Not difficult at all    phq 9 is negative  Fall Risk:    09/18/2023    3:28 PM 06/04/2023   10:11 AM 05/26/2023    2:16 PM 03/03/2023    9:16 AM 12/01/2022   10:32 AM  Fall Risk   Falls in the past year? 0 0 0 0 0  Number falls in past yr: 0 0 0    Injury with Fall? 0 0 0    Risk for fall due to : No Fall Risks No Fall Risks No Fall Risks No Fall Risks No Fall Risks  Follow up Falls prevention discussed;Education provided;Falls evaluation completed Falls prevention discussed Falls prevention discussed Falls prevention discussed Falls prevention discussed     Assessment and Plan    Vaginal Odor Intermittent vaginal odor with no associated discharge, dysuria, or new sexual partners. Possible dehydration. No clear source identified. -Drink more water. -Check for gonorrhea and chlamydia, BV, yeast and trichomonas.  Epigastric Pain Epigastric cramping pain, intermittent for the past few months. No clear triggers identified. Differential diagnosis includes gastritis, GERD, and gallbladder disease. -Order H. pylori urea breath test. -If H. pylori test is negative, we will check gallbladder US  -she had a reaction to pantoprazole  - she states it caused left arm pain, however she also has chronic left arm numbness without PPI and unlikely related. We will try H2 blocker instead  Chronic Nausea Chronic nausea with and without food. No clear cause identified. -Consider treatment with Nexium,  pending H. pylori test results and patient's previous adverse reaction to pantoprazole .  Left Arm Numbness Chronic left arm numbness, possibly related to neck issues. No weakness or dropping items reported. -we ordered  cervical spine x-ray back in October but she did not get it done , she will go today. -Consider physical therapy depending on x-ray results. -Consider nerve conduction study, waiting for x-ray c-spine and may order MRI first  Insomnia Chronic insomnia with no clear cause. No current treatment. -Prescribe hydroxyzine  1-2 tablets as needed before bed.  Lower Abdominal Pain Lower abdominal pain during exam, possibly related to urinary issues. -Order urine culture. -Check for gonorrhea and chlamydia.  General Health Maintenance / Followup Plans -Schedule follow-up appointment in two months. -Ensure adequate supply of hydrochlorothiazide .

## 2023-09-21 ENCOUNTER — Other Ambulatory Visit: Payer: Self-pay

## 2023-09-21 ENCOUNTER — Encounter: Payer: Self-pay | Admitting: Family Medicine

## 2023-09-21 DIAGNOSIS — R1013 Epigastric pain: Secondary | ICD-10-CM

## 2023-09-21 LAB — URINE CULTURE
MICRO NUMBER:: 16057193
SPECIMEN QUALITY:: ADEQUATE

## 2023-09-22 LAB — URINE CULTURE
MICRO NUMBER:: 16065336
Result:: NO GROWTH
SPECIMEN QUALITY:: ADEQUATE

## 2023-09-22 LAB — CERVICOVAGINAL ANCILLARY ONLY
Bacterial Vaginitis (gardnerella): NEGATIVE
Candida Glabrata: NEGATIVE
Candida Vaginitis: NEGATIVE
Comment: NEGATIVE
Comment: NEGATIVE
Comment: NEGATIVE
Comment: NEGATIVE
Trichomonas: NEGATIVE

## 2023-09-22 LAB — H. PYLORI BREATH TEST: H. pylori Breath Test: NOT DETECTED

## 2023-09-23 ENCOUNTER — Encounter: Payer: Self-pay | Admitting: Family Medicine

## 2023-09-30 ENCOUNTER — Ambulatory Visit: Payer: BC Managed Care – PPO

## 2023-10-02 ENCOUNTER — Encounter: Payer: Self-pay | Admitting: Family Medicine

## 2023-10-06 ENCOUNTER — Ambulatory Visit (INDEPENDENT_AMBULATORY_CARE_PROVIDER_SITE_OTHER): Payer: BC Managed Care – PPO | Admitting: Family Medicine

## 2023-10-06 ENCOUNTER — Encounter: Payer: Self-pay | Admitting: Family Medicine

## 2023-10-06 VITALS — BP 114/76 | HR 83 | Temp 98.4°F | Resp 16 | Ht 61.0 in | Wt 201.1 lb

## 2023-10-06 DIAGNOSIS — Z13220 Encounter for screening for lipoid disorders: Secondary | ICD-10-CM

## 2023-10-06 DIAGNOSIS — Z0000 Encounter for general adult medical examination without abnormal findings: Secondary | ICD-10-CM

## 2023-10-06 DIAGNOSIS — Z0001 Encounter for general adult medical examination with abnormal findings: Secondary | ICD-10-CM | POA: Diagnosis not present

## 2023-10-06 DIAGNOSIS — R7303 Prediabetes: Secondary | ICD-10-CM | POA: Diagnosis not present

## 2023-10-06 DIAGNOSIS — Z1211 Encounter for screening for malignant neoplasm of colon: Secondary | ICD-10-CM

## 2023-10-06 DIAGNOSIS — Z1231 Encounter for screening mammogram for malignant neoplasm of breast: Secondary | ICD-10-CM

## 2023-10-06 DIAGNOSIS — Z130 Encounter for screening for diseases of the blood and blood-forming organs and certain disorders involving the immune mechanism: Secondary | ICD-10-CM | POA: Diagnosis not present

## 2023-10-06 NOTE — Progress Notes (Signed)
 Name: Bethany [name]   MRN: [id number]    DOB: [birth year]   Date:10/06/2023       Progress Note  Subjective  Chief Complaint  Chief Complaint  Patient presents with   Annual Exam    HPI  Patient presents for annual CPE.  Diet: she is trying to pack her lunch for work, eating more fruit and vegetables Exercise: she tried to do bootcamp but stopped due to SOB , she has been sedentary and has sob since covid back in 2019 . Discussed start walking daily and progress to going up hills but must be done daily   Last Eye Exam: completed Last Dental Exam: needs to schedule it  Flowsheet Row Office Visit from 10/06/2023 in [facility]  AUDIT-C Score 1       Depression: Phq 9 is  negative    10/06/2023    2:17 PM 09/18/2023    3:28 PM 06/04/2023   10:11 AM 05/26/2023    2:16 PM 03/03/2023    9:19 AM  Depression screen PHQ 2/9  Decreased Interest 0 0 0 0 0  Down, Depressed, Hopeless 0 0 0 0 0  PHQ - 2 Score 0 0 0 0 0  Altered sleeping 0 0 1  1  Tired, decreased energy 0 0 1  1  Change in appetite 0 0 1  2  Feeling bad or failure about yourself  0 0 0  0  Trouble concentrating 0 0 1  1  Moving slowly or fidgety/restless 0 0 0  0  Suicidal thoughts 0 0 0  0  PHQ-9 Score 0 0 4  5  Difficult doing work/chores Not difficult at all Not difficult at all   Not difficult at all   Hypertension: BP Readings from Last 3 Encounters:  10/06/23 114/76  09/18/23 124/72  06/04/23 122/72   Obesity: Wt Readings from Last 3 Encounters:  10/06/23 201 lb 1.6 oz (91.2 kg)  09/18/23 199 lb 9.6 oz (90.5 kg)  06/04/23 197 lb (89.4 kg)   BMI Readings from Last 3 Encounters:  10/06/23 38.00 kg/m  09/18/23 38.98 kg/m  06/04/23 38.47 kg/m     Vaccines: reviewed with the patient.   Hep C Screening: completed STD testing and prevention (HIV/chl/gon/syphilis): up to date Intimate partner violence: negative screen  Sexual History :not currently sexually active   Menstrual History/LMP/Abnormal Bleeding: regular cycles, lasts 3-4 days only heavy for the first day Discussed importance of follow up if any post-menopausal bleeding: not applicable  Incontinence Symptoms: negative for symptoms   Breast cancer:  - Last Mammogram: up to date  - BRCA gene screening: one aunt with breast cancer  Osteoporosis Prevention : Discussed high calcium and vitamin D supplementation, weight bearing exercises Bone density :not applicable   Cervical cancer screening: up-to-date  Skin cancer: Discussed monitoring for atypical lesions  Colorectal cancer:   due in October  Lung cancer:  Low Dose CT Chest recommended if Age 20-80 years, 20 pack-year currently smoking OR have quit w/in 15years. Patient does not qualify for screen   ECG: 05/2023  Advanced Care Planning: A voluntary discussion about advance care planning including the explanation and discussion of advance directives.  Discussed health care proxy and Living will, and the patient was able to identify a health care proxy as sister .  Patient does not know have a living will and power of attorney of health care   Patient Active Problem List   Diagnosis Date Noted  Moderate major depression (HCC) 06/04/2023   GAD (generalized anxiety disorder) 06/04/2023   Vitamin D deficiency 06/04/2023   Migraine without aura and without status migrainosus, not intractable 06/04/2023   Pre-diabetes 06/04/2023   PVC (premature ventricular contraction) 06/04/2023   Cervical radiculitis 06/04/2023   Palpitations 08/23/2020   Mitral valve prolapse 01/11/2019   Hypertension, benign 09/14/2018   Chronic left hip pain 12/04/2017   Obesity, Class II, BMI 35-39.9 12/04/2017   Abnormal finding on MRI of brain 11/05/2017   Mild major depression (HCC) 09/11/2017   Patellofemoral stress syndrome of both knees 08/09/2014    Past Surgical History:  Procedure Laterality Date   CESAREAN SECTION  2002   CESAREAN SECTION  2005     Family History  Problem Relation Age of Onset   Emphysema Mother    Hypercalcemia Mother    Hypertension Mother    Sickle cell trait Mother    Clotting disorder Mother    Prostate cancer Father    Diabetes Mellitus II Father    Hypertension Father    Heart Problems Father        Enlarged Heart   Stroke Maternal Grandmother    Heart failure Maternal Grandmother    Emphysema Maternal Grandfather    Lung cancer Maternal Aunt    Breast cancer Maternal Aunt        early 60's   Lung cancer Maternal Uncle    Kidney disease Paternal Aunt    Seizures Half-Sister    Thyroid disease Half-Sister     Social History   Socioeconomic History   Marital status: Married    Spouse name: Casimiro Needle    Number of children: 2   Years of education: Not on file   Highest education level: Some college, no degree  Occupational History   Occupation: food service    Comment: at [employer]   Tobacco Use   Smoking status: Never   Smokeless tobacco: Never  Vaping Use   Vaping status: Never Used  Substance and Sexual Activity   Alcohol use: Not Currently    Alcohol/week: 0.0 standard drinks of alcohol    Comment: occasionally   Drug use: No   Sexual activity: Not Currently    Partners: Male    Birth control/protection: None  Other Topics Concern   Not on file  Social History Narrative   Separated , has two children at home, her relatives live [place], she will move back to [place] in May 2021    Works at [employer]    She moved to [place] 2011 for a safer place to raise her children    Her son is homosexual and has been bullied    Daughter has depression    Social Drivers of Corporate investment banker Strain: Medium Risk (10/05/2023)   Overall Financial Resource Strain (CARDIA)    Difficulty of Paying Living Expenses: Somewhat hard  Food Insecurity: Food Insecurity Present (10/05/2023)   Hunger Vital Sign    Worried About Running Out of Food in the Last Year: Patient declined     Ran Out of Food in the Last Year: Sometimes true  Transportation Needs: No Transportation Needs (10/05/2023)   PRAPARE - Administrator, Civil Service (Medical): No    Lack of Transportation (Non-Medical): No  Physical Activity: Insufficiently Active (10/05/2023)   Exercise Vital Sign    Days of Exercise per Week: 1 day    Minutes of Exercise per Session: 20 min  Stress: Stress Concern  Present (10/05/2023)   [employer] of Occupational Health - Occupational Stress Questionnaire    Feeling of Stress : Very much  Social Connections: Unknown (10/05/2023)   Social Connection and Isolation Panel [NHANES]    Frequency of Communication with Friends and Family: Three times a week    Frequency of Social Gatherings with Friends and Family: Patient declined    Attends Religious Services: Patient declined    Active Member of Clubs or Organizations: No    Attends Engineer, structural: Not on file    Marital Status: Separated  Intimate Partner Violence: Not At Risk (10/06/2023)   Humiliation, Afraid, Rape, and Kick questionnaire    Fear of Current or Ex-Partner: No    Emotionally Abused: No    Physically Abused: No    Sexually Abused: No     Current Outpatient Medications:    celecoxib (CELEBREX) 100 MG capsule, Take 1 capsule (100 mg total) by mouth 2 (two) times daily as needed., Disp: 180 capsule, Rfl: 0   Cholecalciferol (VITAMIN D) 50 MCG (2000 UT) CAPS, Take 1 capsule by mouth daily., Disp: , Rfl:    famotidine (PEPCID) 40 MG tablet, Take 1 tablet (40 mg total) by mouth daily., Disp: 30 tablet, Rfl: 0   fexofenadine (ALLEGRA) 180 MG tablet, Take 180 mg by mouth daily., Disp: , Rfl:    fluticasone (FLONASE) 50 MCG/ACT nasal spray, Place 2 sprays into both nostrils daily. (Patient taking differently: Place 2 sprays into both nostrils daily as needed.), Disp: 48 g, Rfl: 1   hydrochlorothiazide (HYDRODIURIL) 12.5 MG tablet, Take 1 tablet (12.5 mg total) by mouth daily.,  Disp: 90 tablet, Rfl: 1   hydrOXYzine (ATARAX) 10 MG tablet, Take 1-2 tablets (10-20 mg total) by mouth at bedtime., Disp: 60 tablet, Rfl: 0   metoprolol succinate (TOPROL-XL) 50 MG 24 hr tablet, Take 1 tablet (50 mg total) by mouth daily. Take with or immediately following a meal., Disp: 90 tablet, Rfl: 1   Multiple Vitamin (MULTIVITAMIN WITH MINERALS) TABS tablet, Take 1 tablet by mouth daily., Disp: , Rfl:    Probiotic Product (UP4 PROBIOTICS WOMENS PO), Take by mouth., Disp: , Rfl:   Allergies  Allergen Reactions   Pantoprazole     Pain in lef,t arm numbness and trouble moving arm      ROS  Ten systems reviewed and is negative except as mentioned in HPI    Objective  Vitals:   10/06/23 1418  BP: 114/76  Pulse: 83  Resp: 16  Temp: 98.4 F (36.9 C)  TempSrc: Oral  SpO2: 98%  Weight: 201 lb 1.6 oz (91.2 kg)  Height: 5\' 1"  (1.549 m)    Body mass index is 38 kg/m.  Physical Exam  Constitutional: Patient appears well-developed and well-nourished. Obese  No distress.  HEENT: head atraumatic, normocephalic, pupils equal and reactive to light, neck supple Cardiovascular: Normal rate, regular rhythm and normal heart sounds.  No murmur heard. No BLE edema. Pulmonary/Chest: Effort normal and breath sounds normal. No respiratory distress. Abdominal: Soft.  There is no tenderness. Psychiatric: Patient has a normal mood and affect. behavior is normal. Judgment and thought content normal.     Assessment & Plan  1. Well adult exam (Primary)  - Lipid panel - COMPLETE METABOLIC PANEL WITH GFR - CBC with Differential/Platelet - Hemoglobin A1c  2. Encounter for screening mammogram for malignant neoplasm of breast  - MM 3D SCREENING MAMMOGRAM BILATERAL BREAST; Future  3. Pre-diabetes  - Hemoglobin A1c  4. Lipid screening  - Lipid panel  5. Screening, iron deficiency anemia  - CBC with Differential/Platelet   -USPSTF grade A and B recommendations reviewed with  patient; age-appropriate recommendations, preventive care, screening tests, etc discussed and encouraged; healthy living encouraged; see AVS for patient education given to patient -Discussed importance of 150 minutes of physical activity weekly, eat two servings of fish weekly, eat one serving of tree nuts ( cashews, pistachios, pecans, almonds.Marland Kitchen) every other day, eat 6 servings of fruit/vegetables daily and drink plenty of water and avoid sweet beverages.   -Reviewed Health Maintenance: Yes.

## 2023-10-07 ENCOUNTER — Encounter: Payer: Self-pay | Admitting: Family Medicine

## 2023-10-07 LAB — COMPLETE METABOLIC PANEL WITH GFR
AG Ratio: 1.3 (calc) (ref 1.0–2.5)
ALT: 24 U/L (ref 6–29)
AST: 18 U/L (ref 10–30)
Albumin: 4.1 g/dL (ref 3.6–5.1)
Alkaline phosphatase (APISO): 42 U/L (ref 31–125)
BUN: 11 mg/dL (ref 7–25)
CO2: 25 mmol/L (ref 20–32)
Calcium: 9.5 mg/dL (ref 8.6–10.2)
Chloride: 104 mmol/L (ref 98–110)
Creat: 0.96 mg/dL (ref 0.50–0.99)
Globulin: 3.1 g/dL (ref 1.9–3.7)
Glucose, Bld: 96 mg/dL (ref 65–99)
Potassium: 3.8 mmol/L (ref 3.5–5.3)
Sodium: 136 mmol/L (ref 135–146)
Total Bilirubin: 0.5 mg/dL (ref 0.2–1.2)
Total Protein: 7.2 g/dL (ref 6.1–8.1)
eGFR: 75 mL/min/{1.73_m2} (ref >=60)

## 2023-10-07 LAB — CBC WITH DIFFERENTIAL/PLATELET
Absolute Lymphocytes: 1624 {cells}/uL (ref 850–3900)
Absolute Monocytes: 409 {cells}/uL (ref 200–950)
Basophils Absolute: 18 {cells}/uL (ref 0–200)
Basophils Relative: 0.4 %
Eosinophils Absolute: 120 {cells}/uL (ref 15–500)
Eosinophils Relative: 2.6 %
HCT: 40 % (ref 35.0–45.0)
Hemoglobin: 13 g/dL (ref 11.7–15.5)
MCH: 27.3 pg (ref 27.0–33.0)
MCHC: 32.5 g/dL (ref 32.0–36.0)
MCV: 83.9 fL (ref 80.0–100.0)
MPV: 10.4 fL (ref 7.5–12.5)
Monocytes Relative: 8.9 %
Neutro Abs: 2429 {cells}/uL (ref 1500–7800)
Neutrophils Relative %: 52.8 %
Platelets: 252 10*3/uL (ref 140–400)
RBC: 4.77 10*6/uL (ref 3.80–5.10)
RDW: 13 % (ref 11.0–15.0)
Total Lymphocyte: 35.3 %
WBC: 4.6 10*3/uL (ref 3.8–10.8)

## 2023-10-07 LAB — LIPID PANEL
Cholesterol: 163 mg/dL (ref <200)
HDL: 50 mg/dL (ref >=50)
LDL Cholesterol (Calc): 90 mg/dL
Non-HDL Cholesterol (Calc): 113 mg/dL (ref <130)
Total CHOL/HDL Ratio: 3.3 (calc) (ref <5.0)
Triglycerides: 133 mg/dL (ref <150)

## 2023-10-07 LAB — HEMOGLOBIN A1C
Hgb A1c MFr Bld: 5.8 %{Hb} — ABNORMAL HIGH (ref <5.7)
Mean Plasma Glucose: 120 mg/dL
eAG (mmol/L): 6.6 mmol/L

## 2023-10-10 DIAGNOSIS — R7309 Other abnormal glucose: Secondary | ICD-10-CM | POA: Diagnosis not present

## 2023-10-15 ENCOUNTER — Encounter: Payer: Self-pay | Admitting: *Deleted

## 2023-10-21 ENCOUNTER — Ambulatory Visit: Payer: BC Managed Care – PPO

## 2023-10-21 NOTE — Progress Notes (Signed)
 Nutrition: 10/21/2023  CC: "I'm having problems with food, shopping, finding time to cook and to not get bored with the foods I'm eating."  HX: HT: 5'1"   WT: 201  BMI: 38.02 Currently taking her Vitamin D, hydrochlorothiazide, Toprol, MVI Flonase, Probiotic, collagen for joint support.   Gives current h/x of having 2 children (daughter and step daughter) in their freshman year at [employer].  Has moved here from [place].  Having some problems adjusting to the [place] food ways in [place].  Currently, works 2 jobs at [employer].  Takes a course on campus herself.  Has a high maintenance 41 year old daughter living in the house with her.  Express issues with stress, being overwhelmed with all she needs to do in one week.  Getting only 4 hours of sleep each night.   Gives history of having participated in a weight loss boot camp in 2020 and lost over 60 lbs.  With COVID she gained the weight back.  Her physician is encouraging her to lose some weight.  She notes that with the stress she is under, it is hard to get motivated to lose weight.  Diet Recall: Breakfast: Often eats no food.   Snack: None Lunch: Often skips and goes to her class on campus. Dinner: Most nights will eat at 9:30 - 10:00 pm after her last job.  Primarily will have fast food from the drive thru.   Does not eat pork or beef.  Limits salt/sodium in foods at home.  Does not use canned foods.  Recommendations: Some day step back and see if there are priorities that need to be changed to decrease the stress in your life and on your body. You don't have to eat "specified foods for breakfast."  Choose foods that you like and are available to you at the time. Canned foods can be used.  Need to rinse them off to remove up to 40% of the sodium from the serving. Please feel free to come back and talk regarding nutrition needs and stress impact.  Teaching Materials: Foods and Food Groups Foods and Food Groups for [employer]. Food  Label  Follow-up: As desired.  Maggie Shirly Bartosiewicz, RN, RD, LDN

## 2023-11-10 ENCOUNTER — Encounter: Payer: Self-pay | Admitting: Family Medicine

## 2023-11-10 ENCOUNTER — Other Ambulatory Visit: Payer: Self-pay | Admitting: Family Medicine

## 2023-11-10 DIAGNOSIS — I493 Ventricular premature depolarization: Secondary | ICD-10-CM

## 2023-11-10 DIAGNOSIS — G43009 Migraine without aura, not intractable, without status migrainosus: Secondary | ICD-10-CM

## 2023-11-10 DIAGNOSIS — I10 Essential (primary) hypertension: Secondary | ICD-10-CM

## 2023-11-10 DIAGNOSIS — R7309 Other abnormal glucose: Secondary | ICD-10-CM | POA: Diagnosis not present

## 2023-11-10 NOTE — Telephone Encounter (Unsigned)
 Copied from CRM [id number]. Topic: Clinical - Medication Refill >> Nov 10, 2023  1:11 PM Everette C wrote: Most Recent Primary Care Visit:  Provider: Alba Cory   Department: [facility]   Visit Type: PHYSICAL   Date: 10/06/2023    Medication: metoprolol succinate (TOPROL-XL) 50 MG 24 hr tablet [[id number]]      Has the patient contacted their pharmacy? Yes  (Agent: If no, request that the patient contact the pharmacy for the refill. If patient does not wish to contact the pharmacy document the reason why and proceed with request.)  (Agent: If yes, when and what did the pharmacy advise?)    Is this the correct pharmacy for this prescription? Yes  If no, delete pharmacy and type the correct one.  This is the patient's preferred pharmacy:  [facility] [address] [place] - [address]  [address]  [place] [address]  Phone: [phone] Fax: [phone]    [id number] - [place], [place] - [address] [facility]  [address] [place]  [place] [address]  Phone: [phone] Fax: [phone]      Has the prescription been filled recently? Yes    Is the patient out of the medication? Yes    Has the patient been seen for an appointment in the last year OR does the patient have an upcoming appointment? Yes    Can we respond through MyChart? No    Agent: Please be advised that Rx refills may take up to 3 business days. We ask that you follow-up with your pharmacy.

## 2023-11-10 NOTE — Telephone Encounter (Signed)
 Copied from CRM [id number]. Topic: Clinical - Medication Refill >> Nov 10, 2023  1:08 PM Everette C wrote: Most Recent Primary Care Visit:  Provider: Alba Cory  Department: [facility]  Visit Type: PHYSICAL  Date: 10/06/2023  Medication: metoprolol succinate (TOPROL-XL) 50 MG 24 hr tablet [[id number]]   Has the patient contacted their pharmacy? Yes (Agent: If no, request that the patient contact the pharmacy for the refill. If patient does not wish to contact the pharmacy document the reason why and proceed with request.) (Agent: If yes, when and what did the pharmacy advise?)  Is this the correct pharmacy for this prescription? Yes If no, delete pharmacy and type the correct one.  This is the patient's preferred pharmacy:  [facility] [address] [place] - [address] [place] [address] Phone: [phone] Fax: [phone]  [facility] - [place], [place] - [address] [facility] [address] [place] [address] Phone: [phone] Fax: [phone]   Has the prescription been filled recently? Yes  Is the patient out of the medication? Yes  Has the patient been seen for an appointment in the last year OR does the patient have an upcoming appointment? Yes  Can we respond through MyChart? No  Agent: Please be advised that Rx refills may take up to 3 business days. We ask that you follow-up with your pharmacy.

## 2023-11-12 MED ORDER — METOPROLOL SUCCINATE ER 50 MG PO TB24
50.0000 mg | ORAL_TABLET | Freq: Every day | ORAL | 0 refills | Status: AC
Start: 2023-11-12 — End: ?

## 2023-11-12 NOTE — Telephone Encounter (Signed)
 Requested Prescriptions  Pending Prescriptions Disp Refills   metoprolol succinate (TOPROL-XL) 50 MG 24 hr tablet 90 tablet 0    Sig: Take 1 tablet (50 mg total) by mouth daily. Take with or immediately following a meal.     Cardiovascular:  Beta Blockers Passed - 11/12/2023 12:17 PM      Passed - Last BP in normal range    BP Readings from Last 1 Encounters:  10/06/23 114/76         Passed - Last Heart Rate in normal range    Pulse Readings from Last 1 Encounters:  10/06/23 83         Passed - Valid encounter within last 6 months    Recent Outpatient Visits           1 month ago Well adult exam   [facility] Alba Cory, [place]   1 month ago Nausea   [facility] Alba Cory, [place]       Future Appointments             In 1 week Alba Cory, [place] [facility]

## 2023-11-18 ENCOUNTER — Ambulatory Visit: Payer: BC Managed Care – PPO | Admitting: Family Medicine

## 2023-11-19 ENCOUNTER — Ambulatory Visit: Payer: BC Managed Care – PPO | Admitting: Family Medicine

## 2023-11-19 ENCOUNTER — Encounter: Payer: Self-pay | Admitting: Family Medicine

## 2023-11-19 DIAGNOSIS — R5383 Other fatigue: Secondary | ICD-10-CM | POA: Diagnosis not present

## 2023-11-19 DIAGNOSIS — R7303 Prediabetes: Secondary | ICD-10-CM | POA: Diagnosis not present

## 2023-11-19 DIAGNOSIS — G5600 Carpal tunnel syndrome, unspecified upper limb: Secondary | ICD-10-CM | POA: Diagnosis not present

## 2023-11-19 DIAGNOSIS — I10 Essential (primary) hypertension: Secondary | ICD-10-CM | POA: Diagnosis not present

## 2023-11-19 DIAGNOSIS — I341 Nonrheumatic mitral (valve) prolapse: Secondary | ICD-10-CM | POA: Diagnosis not present

## 2023-12-01 ENCOUNTER — Encounter: Payer: Self-pay | Admitting: Family Medicine

## 2023-12-01 ENCOUNTER — Ambulatory Visit: Admitting: Family Medicine

## 2023-12-01 VITALS — BP 122/74 | HR 72 | Temp 98.9°F | Resp 16 | Ht 61.0 in | Wt 202.7 lb

## 2023-12-01 DIAGNOSIS — F411 Generalized anxiety disorder: Secondary | ICD-10-CM

## 2023-12-01 DIAGNOSIS — I341 Nonrheumatic mitral (valve) prolapse: Secondary | ICD-10-CM

## 2023-12-01 DIAGNOSIS — I493 Ventricular premature depolarization: Secondary | ICD-10-CM

## 2023-12-01 DIAGNOSIS — R0683 Snoring: Secondary | ICD-10-CM | POA: Insufficient documentation

## 2023-12-01 DIAGNOSIS — I10 Essential (primary) hypertension: Secondary | ICD-10-CM | POA: Diagnosis not present

## 2023-12-01 DIAGNOSIS — E559 Vitamin D deficiency, unspecified: Secondary | ICD-10-CM

## 2023-12-01 DIAGNOSIS — F325 Major depressive disorder, single episode, in full remission: Secondary | ICD-10-CM | POA: Insufficient documentation

## 2023-12-01 DIAGNOSIS — G43009 Migraine without aura, not intractable, without status migrainosus: Secondary | ICD-10-CM

## 2023-12-01 DIAGNOSIS — N951 Menopausal and female climacteric states: Secondary | ICD-10-CM | POA: Insufficient documentation

## 2023-12-01 DIAGNOSIS — J3089 Other allergic rhinitis: Secondary | ICD-10-CM | POA: Insufficient documentation

## 2023-12-01 DIAGNOSIS — R7303 Prediabetes: Secondary | ICD-10-CM

## 2023-12-01 MED ORDER — HYDROCHLOROTHIAZIDE 12.5 MG PO TABS: 12.5000 mg | ORAL_TABLET | Freq: Every day | ORAL | 1 refills | Status: DC

## 2023-12-01 NOTE — Progress Notes (Signed)
 Name: Bethany [name]   MRN: [id number]    DOB: [birth year]   Date:12/01/2023       Progress Note  Subjective  Chief Complaint  Chief Complaint  Patient presents with   Medical Management of Chronic Issues   Discussed the use of AI scribe software for clinical note transcription with the patient, who gave verbal consent to proceed.  History of Present Illness She is a 43 year old female with mitral valve prolapse and hypertension who presents for a regular follow-up visit.  She has a MVP and PVC but  cardiologist recently stopped metoprolol  due to fatigue, leading to a significant improvement in her energy levels. No palpitations have occurred since discontinuing the medication. Her blood pressure was recorded as 121/78 mmHg during her last cardiology visit.  She is planning to undergo a sleep study due to concerns about sleep apnea, suspected because of snoring and obesity. The study has not been scheduled yet due to insurance issues. Her Epworth Sleepiness Scale score was 10 today .  She has prediabetes with an A1c of 5.8. She experiences occasional thirst, which she attributes to inadequate fluid intake, and hunger at inappropriate times, such as late at night, which she attributes to her work schedule.  She has a history of patellofemoral stress syndrome in both knees and experiences hip pain. She does not currently use any medication for these conditions but has Celebrex  available for use as needed.  She has a history of migraines with left arm numbness but is not currently experiencing migraines. An x-ray is scheduled for her spine due to intermittent tingling in her left arm, which starts from the neck and radiates towards the thumb, stopping above the wrist.She has seen Dr. Mason Sole in the past . She used to take magnesium but stopped supplementation over one year ago   She has a history of anxiety disorder and major depressive disorder, which were previously in remission. She feels  anxious due to family stressors, particularly related to her daughter and grandson living with her. She does not currently take medication for anxiety.  She takes vitamin D  supplements and has noticed a positive difference since starting them.   She uses Allegra as needed for her perennial allergic rhinitis and has not started her usual nasal spray this year but reports no significant symptoms.  She is not currently taking any medication for reflux and has completed her course of Pepcid . She takes Tylenol as needed for pain and no longer takes  hydroxyzine  for anxiety.   Patient Active Problem List   Diagnosis Date Noted   Moderate major depression (HCC) 06/04/2023   GAD (generalized anxiety disorder) 06/04/2023   Vitamin D  deficiency 06/04/2023   Migraine without aura and without status migrainosus, not intractable 06/04/2023   Pre-diabetes 06/04/2023   PVC (premature ventricular contraction) 06/04/2023   Cervical radiculitis 06/04/2023   Palpitations 08/23/2020   Mitral valve prolapse 01/11/2019   Hypertension, benign 09/14/2018   Chronic left hip pain 12/04/2017   Obesity, Class II, BMI 35-39.9 12/04/2017   Abnormal finding on MRI of brain 11/05/2017   Mild major depression (HCC) 09/11/2017   Patellofemoral stress syndrome of both knees 08/09/2014    Past Surgical History:  Procedure Laterality Date   CESAREAN SECTION  2002   CESAREAN SECTION  2005    Family History  Problem Relation Age of Onset   Emphysema Mother    Hypercalcemia Mother    Hypertension Mother    Sickle cell trait Mother  Clotting disorder Mother    Prostate cancer Father    Diabetes Mellitus II Father    Hypertension Father    Heart Problems Father        Enlarged Heart   Stroke Maternal Grandmother    Heart failure Maternal Grandmother    Emphysema Maternal Grandfather    Lung cancer Maternal Aunt    Breast cancer Maternal Aunt        early 30's   Lung cancer Maternal Uncle    Kidney  disease Paternal Aunt    Seizures Half-Sister    Thyroid  disease Half-Sister     Social History   Tobacco Use   Smoking status: Never   Smokeless tobacco: Never  Substance Use Topics   Alcohol use: Not Currently    Alcohol/week: 0.0 standard drinks of alcohol    Comment: occasionally     Current Outpatient Medications:    celecoxib  (CELEBREX ) 100 MG capsule, Take 1 capsule (100 mg total) by mouth 2 (two) times daily as needed., Disp: 180 capsule, Rfl: 0   Cholecalciferol (VITAMIN D ) 50 MCG (2000 UT) CAPS, Take 1 capsule by mouth daily., Disp: , Rfl:    famotidine  (PEPCID ) 40 MG tablet, Take 1 tablet (40 mg total) by mouth daily., Disp: 30 tablet, Rfl: 0   fexofenadine (ALLEGRA) 180 MG tablet, Take 180 mg by mouth daily., Disp: , Rfl:    fluticasone  (FLONASE ) 50 MCG/ACT nasal spray, Place 2 sprays into both nostrils daily. (Patient taking differently: Place 2 sprays into both nostrils daily as needed.), Disp: 48 g, Rfl: 1   hydrochlorothiazide  (HYDRODIURIL ) 12.5 MG tablet, Take 1 tablet (12.5 mg total) by mouth daily., Disp: 90 tablet, Rfl: 1   hydrOXYzine  (ATARAX ) 10 MG tablet, Take 1-2 tablets (10-20 mg total) by mouth at bedtime., Disp: 60 tablet, Rfl: 0   Multiple Vitamin (MULTIVITAMIN WITH MINERALS) TABS tablet, Take 1 tablet by mouth daily., Disp: , Rfl:    Probiotic Product (UP4 PROBIOTICS WOMENS PO), Take by mouth., Disp: , Rfl:    metoprolol  succinate (TOPROL -XL) 50 MG 24 hr tablet, Take 1 tablet (50 mg total) by mouth daily. Take with or immediately following a meal. (Patient not taking: Reported on 12/01/2023), Disp: 90 tablet, Rfl: 0  Allergies  Allergen Reactions   Pantoprazole      Pain in lef,t arm numbness and trouble moving arm     I personally reviewed active problem list, medication list, allergies, family history with the patient/caregiver today.   ROS  Ten systems reviewed and is negative except as mentioned in HPI    Objective Physical  Exam Constitutional: Patient appears well-developed and well-nourished. Obese  No distress.  HEENT: head atraumatic, normocephalic, pupils equal and reactive to light, neck supple Cardiovascular: Normal rate, regular rhythm and normal heart sounds.  No murmur heard. No BLE edema. Pulmonary/Chest: Effort normal and breath sounds normal. No respiratory distress. Abdominal: Soft.  There is no tenderness. Psychiatric: Patient has a normal mood and affect. behavior is normal. Judgment and thought content normal.     Vitals:   12/01/23 0854  BP: 122/74  Pulse: 72  Resp: 16  Temp: 98.9 F (37.2 C)  TempSrc: Oral  SpO2: 99%  Weight: 202 lb 11.2 oz (91.9 kg)  Height: 5\' 1"  (1.549 m)    Body mass index is 38.3 kg/m.  Recent Results (from the past 2160 hours)  Cervicovaginal ancillary only     Status: None   Collection Time: 09/18/23  3:50 PM  Result Value  Ref Range   Trichomonas Negative    Bacterial Vaginitis (gardnerella) Negative    Candida Vaginitis Negative    Candida Glabrata Negative    Comment Normal Reference Range Candida Species - Negative    Comment Normal Reference Range Candida Galbrata - Negative    Comment Normal Reference Range Trichomonas - Negative    Comment      Normal Reference Range Bacterial Vaginosis - Negative  H. pylori breath test     Status: None   Collection Time: 09/18/23  4:08 PM  Result Value Ref Range   H. pylori Breath Test NOT DETECTED NOT DETECTED    Comment: . Antimicrobials, proton pump inhibitors, and bismuth preparations are known to suppress H. pylori, and  ingestion of these prior to H. pylori diagnostic testing may lead to false negative results. If clinically  indicated, the test may be repeated on a new specimen obtained two weeks after discontinuing treatment. However, a positive result is still clinically valid.   Urine Culture     Status: None   Collection Time: 09/18/23  4:36 PM   Specimen: Urine  Result Value Ref Range    MICRO NUMBER: [id number]    SPECIMEN QUALITY: Adequate    Sample Source URINE    STATUS: FINAL    Result:      Mixed genital flora isolated. These superficial bacteria are not indicative of a urinary tract infection. No further organism identification is warranted on this specimen. If clinically indicated, recollect clean-catch, mid-stream urine and transfer  immediately to Urine Culture Transport Tube.   Urine Culture     Status: None   Collection Time: 09/21/23  1:41 PM   Specimen: Urine  Result Value Ref Range   MICRO NUMBER: [id number]    SPECIMEN QUALITY: Adequate    Sample Source URINE    STATUS: FINAL    Result: No Growth   Lipid panel     Status: None   Collection Time: 10/06/23  2:55 PM  Result Value Ref Range   Cholesterol 163 <200 mg/dL   HDL 50 > OR = 50 mg/dL   Triglycerides 284 <132 mg/dL   LDL Cholesterol (Calc) 90 mg/dL (calc)    Comment: Reference range: <100 . Desirable range <100 mg/dL for primary prevention;   <70 mg/dL for patients with CHD or diabetic patients  with > or = 2 CHD risk factors. Aaron Aas LDL-C is now calculated using the Martin-Hopkins  calculation, which is a validated novel method providing  better accuracy than the Friedewald equation in the  estimation of LDL-C.  Melinda Sprawls et al. Erroll Heard. 4401;027(25): 2061-2068  (http://education.[facility].com/faq/FAQ164)    Total CHOL/HDL Ratio 3.3 <5.0 (calc)   Non-HDL Cholesterol (Calc) 113 <130 mg/dL (calc)    Comment: For patients with diabetes plus 1 major ASCVD risk  factor, treating to a non-HDL-C goal of <100 mg/dL  (LDL-C of <36 mg/dL) is considered a therapeutic  option.   COMPLETE METABOLIC PANEL WITH GFR     Status: None   Collection Time: 10/06/23  2:55 PM  Result Value Ref Range   Glucose, Bld 96 65 - 99 mg/dL    Comment: .            Fasting reference interval .    BUN 11 7 - 25 mg/dL   Creat 6.44 0.34 - 7.42 mg/dL   eGFR 75 > OR = 60 VZ/DGL/8.75I4   BUN/Creatinine Ratio SEE  NOTE: 6 - 22 (calc)    Comment:  Not Reported: BUN and Creatinine are within    reference range. .    Sodium 136 135 - 146 mmol/L   Potassium 3.8 3.5 - 5.3 mmol/L   Chloride 104 98 - 110 mmol/L   CO2 25 20 - 32 mmol/L   Calcium 9.5 8.6 - 10.2 mg/dL   Total Protein 7.2 6.1 - 8.1 g/dL   Albumin 4.1 3.6 - 5.1 g/dL   Globulin 3.1 1.9 - 3.7 g/dL (calc)   AG Ratio 1.3 1.0 - 2.5 (calc)   Total Bilirubin 0.5 0.2 - 1.2 mg/dL   Alkaline phosphatase (APISO) 42 31 - 125 U/L   AST 18 10 - 30 U/L   ALT 24 6 - 29 U/L  CBC with Differential/Platelet     Status: None   Collection Time: 10/06/23  2:55 PM  Result Value Ref Range   WBC 4.6 3.8 - 10.8 Thousand/uL   RBC 4.77 3.80 - 5.10 Million/uL   Hemoglobin 13.0 11.7 - 15.5 g/dL   HCT 25.8 52.7 - 78.2 %   MCV 83.9 80.0 - 100.0 fL   MCH 27.3 27.0 - 33.0 pg   MCHC 32.5 32.0 - 36.0 g/dL    Comment: For adults, a slight decrease in the calculated MCHC value (in the range of 30 to 32 g/dL) is most likely not clinically significant; however, it should be interpreted with caution in correlation with other red cell parameters and the patient's clinical condition.    RDW 13.0 11.0 - 15.0 %   Platelets 252 140 - 400 Thousand/uL   MPV 10.4 7.5 - 12.5 fL   Neutro Abs 2,429 1,500 - 7,800 cells/uL   Absolute Lymphocytes 1,624 850 - 3,900 cells/uL   Absolute Monocytes 409 200 - 950 cells/uL   Eosinophils Absolute 120 15 - 500 cells/uL   Basophils Absolute 18 0 - 200 cells/uL   Neutrophils Relative % 52.8 %   Total Lymphocyte 35.3 %   Monocytes Relative 8.9 %   Eosinophils Relative 2.6 %   Basophils Relative 0.4 %  Hemoglobin A1c     Status: Abnormal   Collection Time: 10/06/23  2:55 PM  Result Value Ref Range   Hgb A1c MFr Bld 5.8 (H) <5.7 % of total Hgb    Comment: For someone without known diabetes, a hemoglobin  A1c value between 5.7% and 6.4% is consistent with prediabetes and should be confirmed with a  follow-up test. . For someone  with known diabetes, a value <7% indicates that their diabetes is well controlled. A1c targets should be individualized based on duration of diabetes, age, comorbid conditions, and other considerations. . This assay result is consistent with an increased risk of diabetes. . Currently, no consensus exists regarding use of hemoglobin A1c for diagnosis of diabetes for children. .    Mean Plasma Glucose 120 mg/dL   eAG (mmol/L) 6.6 mmol/L    Diabetic Foot Exam:     PHQ2/9:    12/01/2023    8:54 AM 10/06/2023    2:17 PM 09/18/2023    3:28 PM 06/04/2023   10:11 AM 05/26/2023    2:16 PM  Depression screen PHQ 2/9  Decreased Interest 0 0 0 0 0  Down, Depressed, Hopeless 0 0 0 0 0  PHQ - 2 Score 0 0 0 0 0  Altered sleeping 0 0 0 1   Tired, decreased energy 0 0 0 1   Change in appetite 0 0 0 1   Feeling bad or failure about  yourself  0 0 0 0   Trouble concentrating 0 0 0 1   Moving slowly or fidgety/restless 0 0 0 0   Suicidal thoughts 0 0 0 0   PHQ-9 Score 0 0 0 4   Difficult doing work/chores Not difficult at all Not difficult at all Not difficult at all      phq 9 is negative  Fall Risk:    09/18/2023    3:28 PM 06/04/2023   10:11 AM 05/26/2023    2:16 PM 03/03/2023    9:16 AM 12/01/2022   10:32 AM  Fall Risk   Falls in the past year? 0 0 0 0 0  Number falls in past yr: 0 0 0    Injury with Fall? 0 0 0    Risk for fall due to : No Fall Risks No Fall Risks No Fall Risks No Fall Risks No Fall Risks  Follow up Falls prevention discussed;Education provided;Falls evaluation completed Falls prevention discussed Falls prevention discussed Falls prevention discussed Falls prevention discussed    Assessment & Plan  Menopause symptoms discussed. Prefers non-pharmacological management.  Hypertension Blood pressure controlled with HCTZ. Metoprolol  discontinued due to fatigue, improved energy, no palpitations. - Continue HCTZ. - Discontinue metoprolol  unless needed for  palpitations.  Obesity BMI over 35 with hypertension and mitral valve prolapse. Weight loss benefits discussed. - Encourage weight loss through dietary changes and physical activity. - Implement intermittent fasting with an eating window from 5 PM to 9 PM.  Prediabetes A1c 5.8, indicating prediabetes. Lifestyle modifications discussed. - Encourage lifestyle modifications including diet and exercise.  Sleep apnea (suspected) ESS score qualifies for sleep study. Discussed CPAP benefits. - Order sleep study.  Mitral valve prolapse Under cardiology care. Metoprolol  as needed for palpitations. - Follow up with cardiology as needed.  Perennial allergic rhinitis Managed with Allegra. No symptoms reported. - Continue Allegra as needed.  Patellofemoral stress syndrome Managed with Celebrex . No symptoms reported. - Use Celebrex  as needed for inflammation.  Migraines No current migraines. Intermittent arm tingling under evaluation. - Complete scheduled x-ray of the spine to rule out DDD cervical spine - Consider MRI if x-ray is positive and symptoms persist.  Major depressive disorder PHQ-9 negative, indicating remission. Anxiety related to personal stressors discussed. - Continue current management without medication.

## 2023-12-09 ENCOUNTER — Ambulatory Visit: Admitting: Family Medicine

## 2023-12-10 DIAGNOSIS — R7309 Other abnormal glucose: Secondary | ICD-10-CM | POA: Diagnosis not present

## 2023-12-24 ENCOUNTER — Ambulatory Visit: Attending: Sleep Medicine

## 2023-12-24 DIAGNOSIS — E669 Obesity, unspecified: Secondary | ICD-10-CM | POA: Insufficient documentation

## 2023-12-24 DIAGNOSIS — G4733 Obstructive sleep apnea (adult) (pediatric): Secondary | ICD-10-CM | POA: Diagnosis not present

## 2023-12-24 DIAGNOSIS — R0683 Snoring: Secondary | ICD-10-CM | POA: Diagnosis not present

## 2023-12-24 DIAGNOSIS — I10 Essential (primary) hypertension: Secondary | ICD-10-CM | POA: Insufficient documentation

## 2023-12-28 DIAGNOSIS — F4310 Post-traumatic stress disorder, unspecified: Secondary | ICD-10-CM | POA: Diagnosis not present

## 2023-12-30 ENCOUNTER — Ambulatory Visit: Admitting: Family Medicine

## 2023-12-31 ENCOUNTER — Ambulatory Visit
Admission: RE | Admit: 2023-12-31 | Discharge: 2023-12-31 | Disposition: A | Discharge status: Home / Self Care | Source: Ambulatory Visit | Attending: Family Medicine | Admitting: Family Medicine

## 2023-12-31 ENCOUNTER — Encounter: Payer: Self-pay | Admitting: Family Medicine

## 2023-12-31 ENCOUNTER — Ambulatory Visit: Admitting: Family Medicine

## 2023-12-31 DIAGNOSIS — M47812 Spondylosis without myelopathy or radiculopathy, cervical region: Secondary | ICD-10-CM | POA: Diagnosis not present

## 2023-12-31 DIAGNOSIS — M5412 Radiculopathy, cervical region: Secondary | ICD-10-CM

## 2023-12-31 DIAGNOSIS — Z1231 Encounter for screening mammogram for malignant neoplasm of breast: Secondary | ICD-10-CM | POA: Insufficient documentation

## 2023-12-31 DIAGNOSIS — R0683 Snoring: Secondary | ICD-10-CM

## 2024-01-01 NOTE — Progress Notes (Signed)
No seen

## 2024-01-07 ENCOUNTER — Ambulatory Visit: Payer: Self-pay | Admitting: Family Medicine

## 2024-01-10 DIAGNOSIS — R7309 Other abnormal glucose: Secondary | ICD-10-CM | POA: Diagnosis not present

## 2024-01-11 ENCOUNTER — Other Ambulatory Visit: Payer: Self-pay

## 2024-01-11 DIAGNOSIS — I10 Essential (primary) hypertension: Secondary | ICD-10-CM

## 2024-01-11 DIAGNOSIS — R0683 Snoring: Secondary | ICD-10-CM

## 2024-01-11 DIAGNOSIS — I341 Nonrheumatic mitral (valve) prolapse: Secondary | ICD-10-CM

## 2024-01-13 DIAGNOSIS — F4310 Post-traumatic stress disorder, unspecified: Secondary | ICD-10-CM | POA: Diagnosis not present

## 2024-01-19 ENCOUNTER — Ambulatory Visit: Admitting: Sleep Medicine

## 2024-01-19 ENCOUNTER — Encounter: Payer: Self-pay | Admitting: Sleep Medicine

## 2024-01-19 VITALS — BP 118/80 | HR 79 | Temp 98.7°F | Ht 60.0 in | Wt 202.8 lb

## 2024-01-19 DIAGNOSIS — G4700 Insomnia, unspecified: Secondary | ICD-10-CM | POA: Diagnosis not present

## 2024-01-19 DIAGNOSIS — Z6839 Body mass index (BMI) 39.0-39.9, adult: Secondary | ICD-10-CM

## 2024-01-19 DIAGNOSIS — G4733 Obstructive sleep apnea (adult) (pediatric): Secondary | ICD-10-CM | POA: Diagnosis not present

## 2024-01-19 DIAGNOSIS — I10 Essential (primary) hypertension: Secondary | ICD-10-CM | POA: Diagnosis not present

## 2024-01-19 DIAGNOSIS — F5104 Psychophysiologic insomnia: Secondary | ICD-10-CM

## 2024-01-19 DIAGNOSIS — E66812 Obesity, class 2: Secondary | ICD-10-CM | POA: Diagnosis not present

## 2024-01-19 NOTE — Progress Notes (Signed)
 Name:Bethany [name] MRN: [id number] DOB: April 21, 1981   CHIEF COMPLAINT:  EXCESSIVE DAYTIME SLEEPINESS   HISTORY OF PRESENT ILLNESS:  Bethany [name] is a 43 y.o. w/ a h/o HTN and obesity who present for c/o loud snoring, witnessed apnea and excessive daytime sleepiness which has been present for several years. Reports nocturnal awakenings due to nocturia, and has difficulty falling back to sleep. Reports a 60 lb weight gain over the last few years. Admits to dry mouth, night sweats and morning headaches. Denies RLS symptoms, dream enactment, cataplexy, hypnagogic or hypnapompic hallucinations. Denies a family history of sleep apnea. Denies drowsy driving. Occasional alcohol use, denies tobacco or denies illicit drug use. The patient recently underwent PSG which revealed mild OSA.   Bedtime 11 pm-2 am Sleep onset 30-60 mins Rise time 6:30-8:30 am   EPWORTH SLEEP SCORE 6    01/19/2024    2:00 PM 12/01/2023    9:00 AM  Results of the Epworth flowsheet  Sitting and reading 0 1  Watching TV 1 2  Sitting, inactive in a public place (e.g. a theatre or a meeting) 1 0  As a passenger in a car for an hour without a break 1 1  Lying down to rest in the afternoon when circumstances permit 2 3  Sitting and talking to someone 0 2  Sitting quietly after a lunch without alcohol 1 3  In a car, while stopped for a few minutes in traffic 0 0  Total score 6 12    PAST MEDICAL HISTORY :   has a past medical history of Diabetes mellitus without complication (HCC) and Hypertension.  has a past surgical history that includes Cesarean section (2002) and Cesarean section (2005). Prior to Admission medications   Medication Sig Start Date End Date Taking? Authorizing Provider  celecoxib  (CELEBREX ) 100 MG capsule Take 1 capsule (100 mg total) by mouth 2 (two) times daily as needed. 06/04/23  Yes Sowles, Krichna, MD  Cholecalciferol (VITAMIN D ) 50 MCG (2000 UT) CAPS Take 1 capsule by mouth daily.    Yes [provider]  fexofenadine (ALLEGRA) 180 MG tablet Take 180 mg by mouth daily.   Yes [provider]  fluticasone  (FLONASE ) 50 MCG/ACT nasal spray Place 2 sprays into both nostrils daily. Patient taking differently: Place 2 sprays into both nostrils daily as needed. 12/01/22  Yes Sowles, Krichna, MD  hydrochlorothiazide  (HYDRODIURIL ) 12.5 MG tablet Take 1 tablet (12.5 mg total) by mouth daily. 12/01/23  Yes Sowles, Krichna, MD  Multiple Vitamin (MULTIVITAMIN WITH MINERALS) TABS tablet Take 1 tablet by mouth daily.   Yes [provider]  Probiotic Product (UP4 PROBIOTICS WOMENS PO) Take by mouth.   Yes [provider]  metoprolol  succinate (TOPROL -XL) 50 MG 24 hr tablet Take 1 tablet (50 mg total) by mouth daily. Take with or immediately following a meal. Patient not taking: Reported on 12/01/2023 11/12/23   Sowles, Krichna, MD   Allergies  Allergen Reactions   Pantoprazole      Pain in lef,t arm numbness and trouble moving arm     FAMILY HISTORY:  family history includes Breast cancer in her maternal aunt; Clotting disorder in her mother; Diabetes Mellitus II in her father; Emphysema in her maternal grandfather and mother; Heart Problems in her father; Heart failure in her maternal grandmother; Hypercalcemia in her mother; Hypertension in her father and mother; Kidney disease in her paternal aunt; Lung cancer in her maternal aunt and maternal uncle; Prostate cancer in  her father; Seizures in her half-sister; Sickle cell trait in her mother; Stroke in her maternal grandmother; Thyroid  disease in her half-sister. SOCIAL HISTORY:  reports that she has never smoked. She has never used smokeless tobacco. She reports that she does not currently use alcohol. She reports that she does not use drugs.   Review of Systems:  Gen:  Denies  fever, sweats, chills weight loss  HEENT: Denies blurred vision, double vision, ear pain, eye pain, hearing loss, nose bleeds,  sore throat Cardiac:  No dizziness, chest pain or heaviness, chest tightness,edema, No JVD Resp:   No cough, -sputum production, -shortness of breath,-wheezing, -hemoptysis,  Gi: Denies swallowing difficulty, stomach pain, nausea or vomiting, diarrhea, constipation, bowel incontinence Gu:  Denies bladder incontinence, burning urine Ext:   Denies Joint pain, stiffness or swelling Skin: Denies  skin rash, easy bruising or bleeding or hives Endoc:  Denies polyuria, polydipsia , polyphagia or weight change Psych:   Denies depression, insomnia or hallucinations  Other:  All other systems negative  VITAL SIGNS: BP 118/80 (BP Location: Left Arm, Patient Position: Sitting, Cuff Size: Large)   Pulse 79   Temp 98.7 F (37.1 C) (Oral)   Ht 5' (1.524 m)   Wt 202 lb 12.8 oz (92 kg)   SpO2 98%   BMI 39.61 kg/m    Physical Examination:   General Appearance: No distress  EYES PERRLA, EOM intact.   NECK Supple, No JVD Pulmonary: normal breath sounds, No wheezing.  CardiovascularNormal S1,S2.  No m/r/g.   Abdomen: Benign, Soft, non-tender. Skin:   warm, no rashes, no ecchymosis  Extremities: normal, no cyanosis, clubbing. Neuro:without focal findings,  speech normal  PSYCHIATRIC: Mood, affect within normal limits.   ASSESSMENT AND PLAN  OSA Reviewed HST results with patient. Starting on APAP therapy set to 4-16 cm H2O. Discussed the consequences of untreated sleep apnea. Advised not to drive drowsy for safety of patient and others. Will follow up in 3 months.    HTN Stable, on current management. Following with PCP.   Obesity Counseled patient on diet and lifestyle modification.   Insomnia Counseled patient on stimulus control and improving sleep hygiene practices.    Patient  satisfied with Plan of action and management. All questions answered  I spent a total of 31 minutes reviewing chart data, face-to-face evaluation with the patient, counseling and coordination of care as  detailed above.    Atzin Buchta, M.D.  Sleep Medicine [facility]

## 2024-01-19 NOTE — Patient Instructions (Signed)

## 2024-01-20 DIAGNOSIS — F4310 Post-traumatic stress disorder, unspecified: Secondary | ICD-10-CM | POA: Diagnosis not present

## 2024-01-22 DIAGNOSIS — R519 Headache, unspecified: Secondary | ICD-10-CM | POA: Diagnosis not present

## 2024-01-22 DIAGNOSIS — R030 Elevated blood-pressure reading, without diagnosis of hypertension: Secondary | ICD-10-CM | POA: Diagnosis not present

## 2024-01-22 DIAGNOSIS — G4730 Sleep apnea, unspecified: Secondary | ICD-10-CM | POA: Diagnosis not present

## 2024-01-29 DIAGNOSIS — G4733 Obstructive sleep apnea (adult) (pediatric): Secondary | ICD-10-CM | POA: Diagnosis not present

## 2024-02-09 DIAGNOSIS — R7309 Other abnormal glucose: Secondary | ICD-10-CM | POA: Diagnosis not present

## 2024-02-24 DIAGNOSIS — F4310 Post-traumatic stress disorder, unspecified: Secondary | ICD-10-CM | POA: Diagnosis not present

## 2024-02-28 DIAGNOSIS — G4733 Obstructive sleep apnea (adult) (pediatric): Secondary | ICD-10-CM | POA: Diagnosis not present

## 2024-03-09 DIAGNOSIS — F4310 Post-traumatic stress disorder, unspecified: Secondary | ICD-10-CM | POA: Diagnosis not present

## 2024-03-10 ENCOUNTER — Telehealth (INDEPENDENT_AMBULATORY_CARE_PROVIDER_SITE_OTHER): Payer: Self-pay | Admitting: Sleep Medicine

## 2024-03-10 NOTE — Telephone Encounter (Signed)
 Mild to moderate OSA.

## 2024-03-11 DIAGNOSIS — R7309 Other abnormal glucose: Secondary | ICD-10-CM | POA: Diagnosis not present

## 2024-03-23 DIAGNOSIS — F4310 Post-traumatic stress disorder, unspecified: Secondary | ICD-10-CM | POA: Diagnosis not present

## 2024-03-30 ENCOUNTER — Encounter: Payer: Self-pay | Admitting: Family Medicine

## 2024-03-30 DIAGNOSIS — G4733 Obstructive sleep apnea (adult) (pediatric): Secondary | ICD-10-CM | POA: Diagnosis not present

## 2024-03-31 ENCOUNTER — Encounter: Payer: Self-pay | Admitting: Nurse Practitioner

## 2024-03-31 ENCOUNTER — Ambulatory Visit (INDEPENDENT_AMBULATORY_CARE_PROVIDER_SITE_OTHER): Admitting: Nurse Practitioner

## 2024-03-31 ENCOUNTER — Other Ambulatory Visit (HOSPITAL_COMMUNITY)
Admission: RE | Admit: 2024-03-31 | Discharge: 2024-03-31 | Disposition: A | Discharge status: Home / Self Care | Source: Ambulatory Visit | Attending: Nurse Practitioner | Admitting: Nurse Practitioner

## 2024-03-31 VITALS — BP 122/72 | HR 92 | Temp 98.2°F | Resp 18 | Ht 60.0 in | Wt 209.7 lb

## 2024-03-31 DIAGNOSIS — Z113 Encounter for screening for infections with a predominantly sexual mode of transmission: Secondary | ICD-10-CM | POA: Diagnosis not present

## 2024-03-31 DIAGNOSIS — N898 Other specified noninflammatory disorders of vagina: Secondary | ICD-10-CM | POA: Diagnosis not present

## 2024-03-31 NOTE — Progress Notes (Signed)
 BP 122/72   Pulse 92   Temp 98.2 F (36.8 C)   Resp 18   Ht 5' (1.524 m)   Wt 209 lb 11.2 oz (95.1 kg)   LMP 03/18/2024   SpO2 99%   BMI 40.95 kg/m    Subjective:    Patient ID: Bethany [name], female    DOB: Mar 12, 1981, 43 y.o.   MRN: [id number]  HPI: Bethany [name] is a 43 y.o. female  Chief Complaint  Patient presents with   Vaginal Discharge    Discussed the use of AI scribe software for clinical note transcription with the patient, who gave verbal consent to proceed.  History of Present Illness Bethany [name] is a 43 year old female who presents requesting STD testing.  Pelvic irritation/vaginal discharge - Pelvic irritation present for less than one week - Uncertain if symptoms are related to ovulation - No dysuria  Sexual activity and contraception - unprotected Sexually active  - No formal birth control used  Menstrual history - Last menstrual period began August 5th - Menstrual cycle normal for her  History of vaginal infections - History of bacterial vaginosis and yeast infections - Previous treatment for bacterial vaginosis with Flagyl  has resulted in subsequent yeast infections - Uses Diflucan  for yeast infections         12/31/2023   10:45 AM 12/01/2023    8:54 AM 10/06/2023    2:17 PM  Depression screen PHQ 2/9  Decreased Interest 0 0 0  Down, Depressed, Hopeless 0 0 0  PHQ - 2 Score 0 0 0  Altered sleeping 0 0 0  Tired, decreased energy 0 0 0  Change in appetite 0 0 0  Feeling bad or failure about yourself  0 0 0  Trouble concentrating 0 0 0  Moving slowly or fidgety/restless 0 0 0  Suicidal thoughts 0 0 0  PHQ-9 Score 0 0 0  Difficult doing work/chores Not difficult at all Not difficult at all Not difficult at all    Relevant past medical, surgical, family and social history reviewed and updated as indicated. Interim medical history since our last visit reviewed. Allergies and medications reviewed and updated.  Review of  Systems  Ten systems reviewed and is negative except as mentioned in HPI      Objective:     BP 122/72   Pulse 92   Temp 98.2 F (36.8 C)   Resp 18   Ht 5' (1.524 m)   Wt 209 lb 11.2 oz (95.1 kg)   LMP 03/18/2024   SpO2 99%   BMI 40.95 kg/m    Wt Readings from Last 3 Encounters:  03/31/24 209 lb 11.2 oz (95.1 kg)  01/19/24 202 lb 12.8 oz (92 kg)  12/01/23 202 lb 11.2 oz (91.9 kg)    Physical Exam Physical Exam GENERAL: Alert, cooperative, well developed, no acute distress HEENT: Normocephalic, normal oropharynx, moist mucous membranes CHEST: Clear to auscultation bilaterally, No wheezes, rhonchi, or crackles CARDIOVASCULAR: Normal heart rate and rhythm, S1 and S2 normal without murmurs ABDOMEN: Soft, non-tender, non-distended, without organomegaly, Normal bowel sounds EXTREMITIES: No cyanosis or edema NEUROLOGICAL: Cranial nerves grossly intact, Moves all extremities without gross motor or sensory deficit   Results for orders placed or performed in visit on 10/06/23  Lipid panel   Collection Time: 10/06/23  2:55 PM  Result Value Ref Range   Cholesterol 163 <200 mg/dL   HDL 50 > OR = 50 mg/dL   Triglycerides 866 <849 mg/dL  LDL Cholesterol (Calc) 90 mg/dL (calc)   Total CHOL/HDL Ratio 3.3 <5.0 (calc)   Non-HDL Cholesterol (Calc) 113 <130 mg/dL (calc)  COMPLETE METABOLIC PANEL WITH GFR   Collection Time: 10/06/23  2:55 PM  Result Value Ref Range   Glucose, Bld 96 65 - 99 mg/dL   BUN 11 7 - 25 mg/dL   Creat 9.03 9.49 - 9.00 mg/dL   eGFR 75 > OR = 60 fO/fpw/8.26f7   BUN/Creatinine Ratio SEE NOTE: 6 - 22 (calc)   Sodium 136 135 - 146 mmol/L   Potassium 3.8 3.5 - 5.3 mmol/L   Chloride 104 98 - 110 mmol/L   CO2 25 20 - 32 mmol/L   Calcium 9.5 8.6 - 10.2 mg/dL   Total Protein 7.2 6.1 - 8.1 g/dL   Albumin 4.1 3.6 - 5.1 g/dL   Globulin 3.1 1.9 - 3.7 g/dL (calc)   AG Ratio 1.3 1.0 - 2.5 (calc)   Total Bilirubin 0.5 0.2 - 1.2 mg/dL   Alkaline phosphatase (APISO) 42  31 - 125 U/L   AST 18 10 - 30 U/L   ALT 24 6 - 29 U/L  CBC with Differential/Platelet   Collection Time: 10/06/23  2:55 PM  Result Value Ref Range   WBC 4.6 3.8 - 10.8 Thousand/uL   RBC 4.77 3.80 - 5.10 Million/uL   Hemoglobin 13.0 11.7 - 15.5 g/dL   HCT 59.9 64.9 - 54.9 %   MCV 83.9 80.0 - 100.0 fL   MCH 27.3 27.0 - 33.0 pg   MCHC 32.5 32.0 - 36.0 g/dL   RDW 86.9 88.9 - 84.9 %   Platelets 252 140 - 400 Thousand/uL   MPV 10.4 7.5 - 12.5 fL   Neutro Abs 2,429 1,500 - 7,800 cells/uL   Absolute Lymphocytes 1,624 850 - 3,900 cells/uL   Absolute Monocytes 409 200 - 950 cells/uL   Eosinophils Absolute 120 15 - 500 cells/uL   Basophils Absolute 18 0 - 200 cells/uL   Neutrophils Relative % 52.8 %   Total Lymphocyte 35.3 %   Monocytes Relative 8.9 %   Eosinophils Relative 2.6 %   Basophils Relative 0.4 %  Hemoglobin A1c   Collection Time: 10/06/23  2:55 PM  Result Value Ref Range   Hgb A1c MFr Bld 5.8 (H) <5.7 % of total Hgb   Mean Plasma Glucose 120 mg/dL   eAG (mmol/L) 6.6 mmol/L          Assessment & Plan:   Problem List Items Addressed This Visit   None Visit Diagnoses       Screen for STD (sexually transmitted disease)    -  Primary   Relevant Orders   Cervicovaginal ancillary only   RPR   Hepatitis C Antibody   HIV antibody (with reflex)     Vaginal discharge       Relevant Orders   Cervicovaginal ancillary only   RPR   Hepatitis C Antibody   HIV antibody (with reflex)        Assessment and Plan Assessment & Plan Screening for sexually transmitted infections Sexually active without formal birth control, requesting STD testing. - Perform blood work for HIV, hepatitis C, and syphilis. - Conduct vaginal swab for gonorrhea, chlamydia, and trichomoniasis. - Release results via MyChart.  Pelvic irritation, possible infectious etiology Pelvic irritation for less than a week, possibly related to ovulation. No dysuria. History of bacterial vaginosis and yeast  infections, with previous treatment causing yeast infections. - Prescribe Flagyl  for bacterial  vaginosis if indicated. - Prescribe Diflucan  for yeast infection if indicated.        Follow up plan: Return if symptoms worsen or fail to improve.

## 2024-04-01 LAB — HEPATITIS C ANTIBODY: Hepatitis C Ab: NONREACTIVE

## 2024-04-01 LAB — HIV ANTIBODY (ROUTINE TESTING W REFLEX): HIV 1&2 Ab, 4th Generation: NONREACTIVE

## 2024-04-01 LAB — RPR: RPR Ser Ql: NONREACTIVE

## 2024-04-04 ENCOUNTER — Ambulatory Visit: Payer: Self-pay | Admitting: Nurse Practitioner

## 2024-04-04 DIAGNOSIS — B9689 Other specified bacterial agents as the cause of diseases classified elsewhere: Secondary | ICD-10-CM

## 2024-04-04 DIAGNOSIS — B379 Candidiasis, unspecified: Secondary | ICD-10-CM

## 2024-04-05 LAB — CERVICOVAGINAL ANCILLARY ONLY
Bacterial Vaginitis (gardnerella): POSITIVE — AB
Candida Glabrata: NEGATIVE
Candida Vaginitis: NEGATIVE
Chlamydia: NEGATIVE
Comment: NEGATIVE
Comment: NEGATIVE
Comment: NEGATIVE
Comment: NEGATIVE
Comment: NEGATIVE
Comment: NORMAL
Neisseria Gonorrhea: NEGATIVE
Trichomonas: NEGATIVE

## 2024-04-05 MED ORDER — METRONIDAZOLE 500 MG PO TABS: 500.0000 mg | ORAL_TABLET | Freq: Two times a day (BID) | ORAL | 0 refills | Status: AC

## 2024-04-05 MED ORDER — FLUCONAZOLE 150 MG PO TABS: 150.0000 mg | ORAL_TABLET | ORAL | 0 refills | Status: DC | PRN

## 2024-04-11 DIAGNOSIS — R7309 Other abnormal glucose: Secondary | ICD-10-CM | POA: Diagnosis not present

## 2024-04-22 ENCOUNTER — Encounter: Payer: Self-pay | Admitting: Sleep Medicine

## 2024-04-22 ENCOUNTER — Ambulatory Visit: Admitting: Sleep Medicine

## 2024-04-22 VITALS — BP 110/78 | HR 78 | Temp 97.5°F | Ht 60.0 in | Wt 210.6 lb

## 2024-04-22 DIAGNOSIS — Z6841 Body Mass Index (BMI) 40.0 and over, adult: Secondary | ICD-10-CM

## 2024-04-22 DIAGNOSIS — I10 Essential (primary) hypertension: Secondary | ICD-10-CM

## 2024-04-22 DIAGNOSIS — G4733 Obstructive sleep apnea (adult) (pediatric): Secondary | ICD-10-CM | POA: Diagnosis not present

## 2024-04-22 MED ORDER — TIRZEPATIDE-WEIGHT MANAGEMENT 2.5 MG/0.5ML ~~LOC~~ SOLN
2.5000 mg | SUBCUTANEOUS | Status: DC
Start: 2024-04-22 — End: 2024-06-02

## 2024-04-22 MED ORDER — ZEPBOUND 2.5 MG/0.5ML ~~LOC~~ SOAJ: 2.5000 mg | SUBCUTANEOUS | 1 refills | Status: DC

## 2024-04-22 NOTE — Patient Instructions (Addendum)

## 2024-04-22 NOTE — Progress Notes (Signed)
 Name:Bethany [name] MRN: [id number] DOB: Jul 07, 1981   CHIEF COMPLAINT:  EXCESSIVE DAYTIME SLEEPINESS   HISTORY OF PRESENT ILLNESS: Bethany [name] is a 43 y.o. w/ a h/o OSA, HTN and morbid obesity who presents for CPAP F/U visit. Reports using CPAP therapy almost every night, which is confirmed by compliance data. She is currently using the Airfit N30i nasal mask, which is comfortable. Reports feeling significantly more refreshed upon awakening with CPAP therapy. Still that she is still adjusting to CPAP therapy.    EPWORTH SLEEP SCORE    01/19/2024    2:00 PM 12/01/2023    9:00 AM  Results of the Epworth flowsheet  Sitting and reading 0 1  Watching TV 1 2  Sitting, inactive in a public place (e.g. a theatre or a meeting) 1 0  As a passenger in a car for an hour without a break 1 1  Lying down to rest in the afternoon when circumstances permit 2 3  Sitting and talking to someone 0 2  Sitting quietly after a lunch without alcohol 1 3  In a car, while stopped for a few minutes in traffic 0 0  Total score 6 12     PAST MEDICAL HISTORY :   has a past medical history of Diabetes mellitus without complication (HCC) and Hypertension.  has a past surgical history that includes Cesarean section (2002) and Cesarean section (2005). Prior to Admission medications   Medication Sig Start Date End Date Taking? Authorizing Provider  celecoxib  (CELEBREX ) 100 MG capsule Take 1 capsule (100 mg total) by mouth 2 (two) times daily as needed. 06/04/23  Yes Sowles, Krichna, MD  Cholecalciferol (VITAMIN D ) 50 MCG (2000 UT) CAPS Take 1 capsule by mouth daily.   Yes [provider]  fexofenadine (ALLEGRA) 180 MG tablet Take 180 mg by mouth daily.   Yes [provider]  fluconazole  (DIFLUCAN ) 150 MG tablet Take 1 tablet (150 mg total) by mouth every 3 (three) days as needed (for vaginal itching/yeast infection sx). 04/05/24  Yes Pender, Julie F, FNP  fluticasone  (FLONASE ) 50  MCG/ACT nasal spray Place 2 sprays into both nostrils daily. Patient taking differently: Place 2 sprays into both nostrils daily as needed. 12/01/22  Yes Sowles, Krichna, MD  hydrochlorothiazide  (HYDRODIURIL ) 12.5 MG tablet Take 1 tablet (12.5 mg total) by mouth daily. 12/01/23  Yes Sowles, Krichna, MD  metoprolol  succinate (TOPROL -XL) 50 MG 24 hr tablet Take 1 tablet (50 mg total) by mouth daily. Take with or immediately following a meal. 11/12/23  Yes Sowles, Krichna, MD  Multiple Vitamin (MULTIVITAMIN WITH MINERALS) TABS tablet Take 1 tablet by mouth daily.   Yes [provider]  Probiotic Product (UP4 PROBIOTICS WOMENS PO) Take by mouth.   Yes [provider]   Allergies  Allergen Reactions   Pantoprazole      Pain in lef,t arm numbness and trouble moving arm     FAMILY HISTORY:  family history includes Breast cancer in her maternal aunt; Clotting disorder in her mother; Diabetes Mellitus II in her father; Emphysema in her maternal grandfather and mother; Heart Problems in her father; Heart failure in her maternal grandmother; Hypercalcemia in her mother; Hypertension in her father and mother; Kidney disease in her paternal aunt; Lung cancer in her maternal aunt and maternal uncle; Prostate cancer in her father; Seizures in her half-sister; Sickle cell trait in her mother; Stroke in her maternal grandmother; Thyroid  disease in her half-sister. SOCIAL HISTORY:  reports that  she has never smoked. She has never used smokeless tobacco. She reports that she does not currently use alcohol. She reports that she does not use drugs.   Review of Systems:  Gen:  Denies  fever, sweats, chills weight loss  HEENT: Denies blurred vision, double vision, ear pain, eye pain, hearing loss, nose bleeds, sore throat Cardiac:  No dizziness, chest pain or heaviness, chest tightness,edema, No JVD Resp:   No cough, -sputum production, -shortness of breath,-wheezing, -hemoptysis,  Gi: Denies  swallowing difficulty, stomach pain, nausea or vomiting, diarrhea, constipation, bowel incontinence Gu:  Denies bladder incontinence, burning urine Ext:   Denies Joint pain, stiffness or swelling Skin: Denies  skin rash, easy bruising or bleeding or hives Endoc:  Denies polyuria, polydipsia , polyphagia or weight change Psych:   Denies depression, insomnia or hallucinations  Other:  All other systems negative  VITAL SIGNS: BP 110/78   Pulse 78   Temp (!) 97.5 F (36.4 C)   Ht 5' (1.524 m)   Wt 210 lb 9.6 oz (95.5 kg)   LMP 03/18/2024   SpO2 99%   BMI 41.13 kg/m    Physical Examination:   General Appearance: No distress  EYES PERRLA, EOM intact.   NECK Supple, No JVD Pulmonary: normal breath sounds, No wheezing.  CardiovascularNormal S1,S2.  No m/r/g.   Abdomen: Benign, Soft, non-tender. Skin:   warm, no rashes, no ecchymosis  Extremities: normal, no cyanosis, clubbing. Neuro:without focal findings,  speech normal  PSYCHIATRIC: Mood, affect within normal limits.   ASSESSMENT AND PLAN  OSA Counseled patient on increasing CPAP compliance. Discussed the consequences of untreated sleep apnea. Advised not to drive drowsy for safety of patient and others. Will follow up in 3 months.     HTN Stable, on current management. Following with PCP.   Morbid obesity Counseled patient on diet and lifestyle modification. Will also try patient on Zepbound  and titrate as tolerated. Denies family or personal history of thyroid  medullary CA or pancreatitis.    Patient  satisfied with Plan of action and management. All questions answered  I spent a total of 31 minutes reviewing chart data, face-to-face evaluation with the patient, counseling and coordination of care as detailed above.    Merwin Breden, M.D.  Sleep Medicine [facility]

## 2024-04-22 NOTE — Addendum Note (Signed)
 Addended by: VICCI EVALENE DEL on: 04/22/2024 10:14 AM   Modules accepted: Orders

## 2024-04-25 DIAGNOSIS — F4310 Post-traumatic stress disorder, unspecified: Secondary | ICD-10-CM | POA: Diagnosis not present

## 2024-04-26 ENCOUNTER — Telehealth: Payer: Self-pay

## 2024-04-26 NOTE — Telephone Encounter (Signed)
 I have notified the patient.   Dr. Jess this is just and FYI.  Nothing further needed.

## 2024-04-26 NOTE — Telephone Encounter (Signed)
*  [facility]  Pharmacy Patient Advocate Encounter   Received notification from Fax that prior authorization for Zepbound  2.5mg  pens is required/requested.   Insurance verification completed.   The patient is insured through [facility] .   Patient is not eligible for therapy for OSA as the sleep study results show Mild OSA and an AHI below 15/hr.

## 2024-04-30 DIAGNOSIS — G4733 Obstructive sleep apnea (adult) (pediatric): Secondary | ICD-10-CM | POA: Diagnosis not present

## 2024-05-09 DIAGNOSIS — F4310 Post-traumatic stress disorder, unspecified: Secondary | ICD-10-CM | POA: Diagnosis not present

## 2024-05-11 DIAGNOSIS — R7309 Other abnormal glucose: Secondary | ICD-10-CM | POA: Diagnosis not present

## 2024-05-21 ENCOUNTER — Encounter: Payer: Self-pay | Admitting: Sleep Medicine

## 2024-05-23 DIAGNOSIS — F4310 Post-traumatic stress disorder, unspecified: Secondary | ICD-10-CM | POA: Diagnosis not present

## 2024-06-01 ENCOUNTER — Ambulatory Visit: Admitting: Family Medicine

## 2024-06-02 ENCOUNTER — Ambulatory Visit (INDEPENDENT_AMBULATORY_CARE_PROVIDER_SITE_OTHER): Admitting: Family Medicine

## 2024-06-02 ENCOUNTER — Encounter: Payer: Self-pay | Admitting: Family Medicine

## 2024-06-02 VITALS — BP 124/72 | HR 84 | Resp 16 | Ht 60.0 in | Wt 198.5 lb

## 2024-06-02 DIAGNOSIS — E559 Vitamin D deficiency, unspecified: Secondary | ICD-10-CM

## 2024-06-02 DIAGNOSIS — Z1211 Encounter for screening for malignant neoplasm of colon: Secondary | ICD-10-CM

## 2024-06-02 DIAGNOSIS — I341 Nonrheumatic mitral (valve) prolapse: Secondary | ICD-10-CM | POA: Diagnosis not present

## 2024-06-02 DIAGNOSIS — I493 Ventricular premature depolarization: Secondary | ICD-10-CM

## 2024-06-02 DIAGNOSIS — J3089 Other allergic rhinitis: Secondary | ICD-10-CM

## 2024-06-02 DIAGNOSIS — G4733 Obstructive sleep apnea (adult) (pediatric): Secondary | ICD-10-CM | POA: Insufficient documentation

## 2024-06-02 DIAGNOSIS — G43009 Migraine without aura, not intractable, without status migrainosus: Secondary | ICD-10-CM

## 2024-06-02 DIAGNOSIS — R002 Palpitations: Secondary | ICD-10-CM | POA: Diagnosis not present

## 2024-06-02 DIAGNOSIS — I10 Essential (primary) hypertension: Secondary | ICD-10-CM | POA: Diagnosis not present

## 2024-06-02 DIAGNOSIS — F325 Major depressive disorder, single episode, in full remission: Secondary | ICD-10-CM

## 2024-06-02 DIAGNOSIS — R7303 Prediabetes: Secondary | ICD-10-CM

## 2024-06-02 MED ORDER — METFORMIN HCL ER 500 MG PO TB24: 500.0000 mg | ORAL_TABLET | Freq: Every day | ORAL | 1 refills | Status: AC

## 2024-06-02 MED ORDER — HYDROCHLOROTHIAZIDE 12.5 MG PO TABS: 12.5000 mg | ORAL_TABLET | Freq: Every day | ORAL | 1 refills | Status: AC

## 2024-06-02 NOTE — Progress Notes (Signed)
 Name: Jaime Dome   MRN: [id number]    DOB: 05/20/1981   Date:06/02/2024       Progress Note  Subjective  Chief Complaint  Chief Complaint  Patient presents with   Medical Management of Chronic Issues   Discussed the use of AI scribe software for clinical note transcription with the patient, who gave verbal consent to proceed.  History of Present Illness Bethany [name] is a 43 year old female with obesity, prediabetes, and hypertension who presents for weight management and medication review.  She has experienced significant weight loss, decreasing from 211 pounds to 198.5 pounds, attributed to the use of Zypram. Her insurance did not cover further treatment, and her last injection was on May 22, 2024.  She has a history of prediabetes and hypertension, for which she takes hydrochlorothiazide  12.5 mg and metoprolol  50 mg as needed. She has not been taking metoprolol  regularly and uses it only if her heart rate increases. No recent migraines, and she manages any headaches with Tylenol.  She is currently living with her in-laws due to a rent increase at her previous residence, which she found unaffordable. This living situation is described as 'awkward' due to limited space and a strained relationship with her mother-in-law. Her daughter stays with her uncle part of the time, and her grandchild is in [place]  with the godparents.  She uses a nasal spray and Allegra as needed for allergies, which she started in February. She has identified certain foods that exacerbate her nasal symptoms and avoids them. She is also using a sleep apnea machine and reports no longer feeling tired during the day.  Her current medications include vitamin D  for deficiency and Celebrex  for joint pain, which she has not needed recently due to the effects of Zypram. She has a history of depression but reports feeling better, managing her responsibilities, and continuing her education and work.    Patient  Active Problem List   Diagnosis Date Noted   Perimenopause 12/01/2023   Perennial allergic rhinitis 12/01/2023   Snoring 12/01/2023   Morbid obesity (HCC) 12/01/2023   Major depression in remission 12/01/2023   Moderate major depression (HCC) 06/04/2023   GAD (generalized anxiety disorder) 06/04/2023   Vitamin D  deficiency 06/04/2023   Migraine without aura and without status migrainosus, not intractable 06/04/2023   Pre-diabetes 06/04/2023   PVC (premature ventricular contraction) 06/04/2023   Cervical radiculitis 06/04/2023   Palpitations 08/23/2020   MVP (mitral valve prolapse) 01/11/2019   Hypertension, benign 09/14/2018   Chronic left hip pain 12/04/2017   Obesity, Class II, BMI 35-39.9 12/04/2017   Abnormal finding on MRI of brain 11/05/2017   Mild major depression 09/11/2017   Patellofemoral stress syndrome of both knees 08/09/2014    Past Surgical History:  Procedure Laterality Date   CESAREAN SECTION  2002   CESAREAN SECTION  2005    Family History  Problem Relation Age of Onset   Emphysema Mother    Hypercalcemia Mother    Hypertension Mother    Sickle cell trait Mother    Clotting disorder Mother    Prostate cancer Father    Diabetes Mellitus II Father    Hypertension Father    Heart Problems Father        Enlarged Heart   Stroke Maternal Grandmother    Heart failure Maternal Grandmother    Emphysema Maternal Grandfather    Lung cancer Maternal Aunt    Breast cancer Maternal Aunt        early  60's   Lung cancer Maternal Uncle    Kidney disease Paternal Aunt    Seizures Half-Sister    Thyroid  disease Half-Sister     Social History   Tobacco Use   Smoking status: Never   Smokeless tobacco: Never  Substance Use Topics   Alcohol use: Not Currently    Alcohol/week: 0.0 standard drinks of alcohol    Comment: occasionally     Current Outpatient Medications:    celecoxib  (CELEBREX ) 100 MG capsule, Take 1 capsule (100 mg total) by mouth 2 (two)  times daily as needed., Disp: 180 capsule, Rfl: 0   Cholecalciferol (VITAMIN D ) 50 MCG (2000 UT) CAPS, Take 1 capsule by mouth daily., Disp: , Rfl:    fexofenadine (ALLEGRA) 180 MG tablet, Take 180 mg by mouth daily., Disp: , Rfl:    fluconazole  (DIFLUCAN ) 150 MG tablet, Take 1 tablet (150 mg total) by mouth every 3 (three) days as needed (for vaginal itching/yeast infection sx)., Disp: 2 tablet, Rfl: 0   fluticasone  (FLONASE ) 50 MCG/ACT nasal spray, Place 2 sprays into both nostrils daily. (Patient taking differently: Place 2 sprays into both nostrils daily as needed.), Disp: 48 g, Rfl: 1   hydrochlorothiazide  (HYDRODIURIL ) 12.5 MG tablet, Take 1 tablet (12.5 mg total) by mouth daily., Disp: 90 tablet, Rfl: 1   metoprolol  succinate (TOPROL -XL) 50 MG 24 hr tablet, Take 1 tablet (50 mg total) by mouth daily. Take with or immediately following a meal., Disp: 90 tablet, Rfl: 0   Multiple Vitamin (MULTIVITAMIN WITH MINERALS) TABS tablet, Take 1 tablet by mouth daily., Disp: , Rfl:    Probiotic Product (UP4 PROBIOTICS WOMENS PO), Take by mouth., Disp: , Rfl:    tirzepatide  (ZEPBOUND ) 2.5 MG/0.5ML injection vial, Inject 2.5 mg into the skin once a week. (Patient not taking: Reported on 06/02/2024), Disp: , Rfl:    tirzepatide  (ZEPBOUND ) 2.5 MG/0.5ML Pen, Inject 2.5 mg into the skin once a week. (Patient not taking: Reported on 06/02/2024), Disp: 2 mL, Rfl: 1  Allergies  Allergen Reactions   Pantoprazole      Pain in lef,t arm numbness and trouble moving arm     I personally reviewed active problem list, medication list, allergies, family history with the patient/caregiver today.   ROS  Ten systems reviewed and is negative except as mentioned in HPI    Objective Physical Exam MEASUREMENTS: Weight- 198.5, BMI- 40.0. CONSTITUTIONAL: Patient appears well-developed and well-nourished.  No distress. HEENT: Head atraumatic, normocephalic, neck supple. CARDIOVASCULAR: Normal rate, regular rhythm and  normal heart sounds.  No murmur heard. No BLE edema. PULMONARY: Effort normal and breath sounds normal. No respiratory distress. ABDOMINAL: There is no tenderness or distention. MUSCULOSKELETAL: Normal gait. Without gross motor or sensory deficit. PSYCHIATRIC: Patient has a normal mood and affect. behavior is normal. Judgment and thought content normal.  Vitals:   06/02/24 0916  BP: 124/72  Pulse: 84  Resp: 16  SpO2: 99%  Weight: 198 lb 8 oz (90 kg)  Height: 5' (1.524 m)    Body mass index is 38.77 kg/m.  Recent Results (from the past 2160 hours)  Cervicovaginal ancillary only     Status: Abnormal   Collection Time: 03/31/24  1:37 PM  Result Value Ref Range   Neisseria Gonorrhea Negative    Chlamydia Negative    Trichomonas Negative    Bacterial Vaginitis (gardnerella) Positive (A)    Candida Vaginitis Negative    Candida Glabrata Negative    Comment      Normal  Reference Range Bacterial Vaginosis - Negative   Comment Normal Reference Ranger Chlamydia - Negative    Comment      Normal Reference Range Neisseria Gonorrhea - Negative   Comment Normal Reference Range Candida Species - Negative    Comment Normal Reference Range Candida Galbrata - Negative    Comment Normal Reference Range Trichomonas - Negative   RPR     Status: None   Collection Time: 03/31/24  1:49 PM  Result Value Ref Range   RPR Ser Ql NON-REACTIVE NON-REACTIVE    Comment: . No laboratory evidence of syphilis. If recent exposure is suspected, submit a new sample in 2-4 weeks. .   Hepatitis C Antibody     Status: None   Collection Time: 03/31/24  1:49 PM  Result Value Ref Range   Hepatitis C Ab NON-REACTIVE NON-REACTIVE    Comment: . HCV antibody was non-reactive. There is no laboratory  evidence of HCV infection. . In most cases, no further action is required. However, if recent HCV exposure is suspected, a test for HCV RNA (test code 64354) is suggested. . For additional information please  refer to http://education.[facility].com/faq/FAQ22v1 (This link is being provided for informational/ educational purposes only.) .   HIV antibody (with reflex)     Status: None   Collection Time: 03/31/24  1:49 PM  Result Value Ref Range   HIV FINAL INTERPRETATION      Comment: HIV Negative . HIV-1 antigen and HIV-1/HIV-2 antibodies were not detected. There is no laboratory evidence of HIV infection.    HIV 1&2 Ab, 4th Generation NON-REACTIVE NON-REACTIVE     PHQ2/9:    06/02/2024    9:14 AM 12/31/2023   10:45 AM 12/01/2023    8:54 AM 10/06/2023    2:17 PM 09/18/2023    3:28 PM  Depression screen PHQ 2/9  Decreased Interest 0 0 0 0 0  Down, Depressed, Hopeless 0 0 0 0 0  PHQ - 2 Score 0 0 0 0 0  Altered sleeping 0 0 0 0 0  Tired, decreased energy 0 0 0 0 0  Change in appetite 0 0 0 0 0  Feeling bad or failure about yourself  0 0 0 0 0  Trouble concentrating 0 0 0 0 0  Moving slowly or fidgety/restless 0 0 0 0 0  Suicidal thoughts 0 0 0 0 0  PHQ-9 Score 0 0 0 0 0  Difficult doing work/chores Not difficult at all Not difficult at all Not difficult at all Not difficult at all Not difficult at all    phq 9 is negative  Fall Risk:    06/02/2024    9:14 AM 12/31/2023   10:45 AM 09/18/2023    3:28 PM 06/04/2023   10:11 AM 05/26/2023    2:16 PM  Fall Risk   Falls in the past year? 0 0 0 0 0  Number falls in past yr: 0 0 0 0 0  Injury with Fall? 0 0 0 0 0  Risk for fall due to : No Fall Risks No Fall Risks No Fall Risks No Fall Risks No Fall Risks  Follow up Falls evaluation completed Falls prevention discussed;Education provided;Falls evaluation completed Falls prevention discussed;Education provided;Falls evaluation completed Falls prevention discussed Falls prevention discussed      Assessment & Plan Obesity with BMI over 35 with comorbidities Obesity with BMI 35.5, reduced from 40. Significant weight loss achieved with Zepbound , but not covered by  insurance. - Prescribe metformin in  the evening to maintain weight loss and curb appetite. Monitor for diarrhea. - Discussed Zepbound  insurance coverage limitations for prediabetes and mild to moderate obstructive sleep apnea.  Prediabetes Prediabetes present. Metformin discussed as effective and affordable treatment. - Prescribe metformin for prediabetes management.  Hypertension Hypertension controlled with hydrochlorothiazide  and metoprolol . Metoprolol  used for tachycardia per cardiologist's advice. - Continue hydrochlorothiazide  12.5 mg. - Use metoprolol  50 mg as needed for tachycardia.  Allergic rhinitis Allergic rhinitis managed with nasal spray and Allegra. Avoidance of food triggers noted. - Continue nasal spray and Allegra as needed.  Vitamin D  deficiency Vitamin D  deficiency managed with supplementation. - Continue vitamin D  supplementation.

## 2024-06-06 DIAGNOSIS — F4310 Post-traumatic stress disorder, unspecified: Secondary | ICD-10-CM | POA: Diagnosis not present

## 2024-06-07 DIAGNOSIS — I10 Essential (primary) hypertension: Secondary | ICD-10-CM | POA: Diagnosis not present

## 2024-06-09 ENCOUNTER — Ambulatory Visit (INDEPENDENT_AMBULATORY_CARE_PROVIDER_SITE_OTHER): Payer: Self-pay | Admitting: Adult Health

## 2024-06-09 ENCOUNTER — Other Ambulatory Visit: Payer: Self-pay

## 2024-06-09 ENCOUNTER — Encounter: Payer: Self-pay | Admitting: Adult Health

## 2024-06-09 VITALS — BP 118/80 | HR 83 | Temp 99.1°F | Ht 60.0 in | Wt 198.0 lb

## 2024-06-09 DIAGNOSIS — W19XXXA Unspecified fall, initial encounter: Secondary | ICD-10-CM

## 2024-06-09 DIAGNOSIS — M79605 Pain in left leg: Secondary | ICD-10-CM

## 2024-06-09 DIAGNOSIS — M25512 Pain in left shoulder: Secondary | ICD-10-CM

## 2024-06-09 NOTE — Progress Notes (Signed)
 [facility] [address]  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  Body Part Injured:Left thigh, Knee, and left shoulder and right upper back.  Vital Signs BP 118/80   Pulse 83   Temp 99.1 F (37.3 C)   Ht 5' (1.524 m)   Wt 198 lb (89.8 kg)   LMP 05/17/2024 (Exact Date)   SpO2 99%   BMI 38.67 kg/m   Injury Description  Stepping down to the landing, it was raining, she slipped and she felt like her knee buckled. She landed down on her left knee.     Provider Note Pain with palpation in left thigh, left shoulder, and right upper back.  No neck pain, or other neurological symptoms.    Diagnosis  1. Fall, initial encounter (Primary) Rest, Ice or heat whichever feels best. Take Tylenol every 6-8 hours or per package instructions.  Follow up if symptoms fail to improve.   2. Leg pain, anterior, left   3. Acute pain of left shoulder      Medications Prescribed  No orders of the defined types were placed in this encounter.   Referred to None at this time.     Return to Work Status No Restrictions, return Nov 3rd for normal activity.   Provider Signature ________________________________________Date_________   Employee Signature _______________________________________Date_________   Please email this completed form to Berwyn Coach, Director of Risk Management at [email or web address] within 24 hours of visit.

## 2024-06-11 DIAGNOSIS — R7309 Other abnormal glucose: Secondary | ICD-10-CM | POA: Diagnosis not present

## 2024-06-15 ENCOUNTER — Telehealth: Payer: Self-pay | Admitting: Family Medicine

## 2024-06-15 NOTE — Telephone Encounter (Signed)
 Pt requesting call back to schedule colonoscopy

## 2024-06-16 ENCOUNTER — Telehealth: Payer: Self-pay

## 2024-06-16 ENCOUNTER — Other Ambulatory Visit: Payer: Self-pay

## 2024-06-16 DIAGNOSIS — Z1211 Encounter for screening for malignant neoplasm of colon: Secondary | ICD-10-CM

## 2024-06-16 MED ORDER — NA SULFATE-K SULFATE-MG SULF 17.5-3.13-1.6 GM/177ML PO SOLN: 1.0000 | Freq: Once | ORAL | 0 refills | Status: AC

## 2024-06-16 NOTE — Telephone Encounter (Signed)
 Gastroenterology Pre-Procedure Review  Request Date: 09/20/24 Requesting Physician: Dr. Melany  PATIENT REVIEW QUESTIONS: The patient responded to the following health history questions as indicated:    1. Are you having any GI issues? no 2. Do you have a personal history of Polyps? no 3. Do you have a family history of Colon Cancer or Polyps? no 4. Diabetes Mellitus? Pre-diabetic not taking Metformin  5. Joint replacements in the past 12 months?no 6. Major health problems in the past 3 months?no 7. Any artificial heart valves, MVP, or defibrillator?yes MVP Clearance sent to patients cardiologist at [facility] Dr. Luevenia    MEDICATIONS & ALLERGIES:    Patient reports the following regarding taking any anticoagulation/antiplatelet therapy:   Plavix, Coumadin, Eliquis, Xarelto, Lovenox, Pradaxa, Brilinta, or Effient? no Aspirin? no  Patient confirms/reports the following medications:  Current Outpatient Medications  Medication Sig Dispense Refill   celecoxib  (CELEBREX ) 100 MG capsule Take 1 capsule (100 mg total) by mouth 2 (two) times daily as needed. (Patient not taking: Reported on 06/09/2024) 180 capsule 0   Cholecalciferol (VITAMIN D ) 50 MCG (2000 UT) CAPS Take 1 capsule by mouth daily.     fexofenadine (ALLEGRA) 180 MG tablet Take 180 mg by mouth daily.     fluticasone  (FLONASE ) 50 MCG/ACT nasal spray Place 2 sprays into both nostrils daily. (Patient taking differently: Place 2 sprays into both nostrils daily as needed.) 48 g 1   hydrochlorothiazide  (HYDRODIURIL ) 12.5 MG tablet Take 1 tablet (12.5 mg total) by mouth daily. 90 tablet 1   metFORMIN (GLUCOPHAGE-XR) 500 MG 24 hr tablet Take 1 tablet (500 mg total) by mouth daily with breakfast. 90 tablet 1   metoprolol  succinate (TOPROL -XL) 50 MG 24 hr tablet Take 1 tablet (50 mg total) by mouth daily. Take with or immediately following a meal. (Patient not taking: Reported on 06/09/2024) 90 tablet 0   Multiple Vitamin (MULTIVITAMIN WITH  MINERALS) TABS tablet Take 1 tablet by mouth daily.     Probiotic Product (UP4 PROBIOTICS WOMENS PO) Take by mouth.     No current facility-administered medications for this visit.    Patient confirms/reports the following allergies:  Allergies  Allergen Reactions   Pantoprazole      Pain in lef,t arm numbness and trouble moving arm     No orders of the defined types were placed in this encounter.   AUTHORIZATION INFORMATION Primary Insurance: 1D#: Group #:  Secondary Insurance: 1D#: Group #:  SCHEDULE INFORMATION: Date:  Time: Location:

## 2024-06-16 NOTE — Telephone Encounter (Signed)
 Pt returned your call.

## 2024-06-17 ENCOUNTER — Encounter: Payer: Self-pay | Admitting: Family Medicine

## 2024-06-20 ENCOUNTER — Other Ambulatory Visit (HOSPITAL_COMMUNITY)
Admission: RE | Admit: 2024-06-20 | Discharge: 2024-06-20 | Disposition: A | Discharge status: Home / Self Care | Source: Ambulatory Visit | Attending: Family Medicine | Admitting: Family Medicine

## 2024-06-20 ENCOUNTER — Ambulatory Visit: Admitting: Family Medicine

## 2024-06-20 ENCOUNTER — Encounter: Payer: Self-pay | Admitting: Family Medicine

## 2024-06-20 VITALS — BP 122/78 | HR 81 | Resp 16 | Ht 60.0 in | Wt 201.6 lb

## 2024-06-20 DIAGNOSIS — F4310 Post-traumatic stress disorder, unspecified: Secondary | ICD-10-CM | POA: Diagnosis not present

## 2024-06-20 DIAGNOSIS — N898 Other specified noninflammatory disorders of vagina: Secondary | ICD-10-CM | POA: Insufficient documentation

## 2024-06-20 DIAGNOSIS — R202 Paresthesia of skin: Secondary | ICD-10-CM | POA: Diagnosis not present

## 2024-06-20 MED ORDER — FLUCONAZOLE 150 MG PO TABS: 150.0000 mg | ORAL_TABLET | ORAL | 0 refills | Status: AC

## 2024-06-20 NOTE — Progress Notes (Signed)
 Name: Bethany [name]   MRN: [id number]    DOB: Mar 12, 1981   Date:06/20/2024       Progress Note  Subjective  Chief Complaint  Chief Complaint  Patient presents with   Tingling    very sharp tingling feeling in my right foot. ( the top of the foot) It is random intermittent pain that feels like tiny needles. It started roughly sometime last week and I'm still experiencing it.     Discussed the use of AI scribe software for clinical note transcription with the patient, who gave verbal consent to proceed.  History of Present Illness Bethany [name] is a 43 year old female who presents with intermittent paresthesia of the right foot.  She experiences intermittent 'pins and needles' sensations on the dorsum of her right foot, which began approximately one week ago. The sensation occasionally extends to the area near the bunion and the base of the big toe, but this has only occurred once. The episodes last for a few seconds and occur without any specific trigger, whether she is walking, sitting, or resting. There have been no recent changes in footwear, twisting injuries, or weakness such as foot drop. She has not previously consulted a podiatrist for this issue. She has been taking Tylenol Arthritis for the discomfort, but finds it difficult to assess its effectiveness due to the intermittent nature of the symptoms.  Additionally, she reports symptoms suggestive of a yeast infection, including significant itching without discharge. She has not been sexually active for over a month and attributes the symptoms to a possible change in detergent or high moisture. She believes she has been treated with Diflucan  before.    Patient Active Problem List   Diagnosis Date Noted   OSA on CPAP 06/02/2024   Perimenopause 12/01/2023   Perennial allergic rhinitis 12/01/2023   Snoring 12/01/2023   Morbid obesity (HCC) 12/01/2023   Major depression in remission 12/01/2023   Moderate major depression (HCC)  06/04/2023   GAD (generalized anxiety disorder) 06/04/2023   Vitamin D  deficiency 06/04/2023   Migraine without aura and without status migrainosus, not intractable 06/04/2023   Pre-diabetes 06/04/2023   PVC (premature ventricular contraction) 06/04/2023   Cervical radiculitis 06/04/2023   Palpitations 08/23/2020   MVP (mitral valve prolapse) 01/11/2019   Hypertension, benign 09/14/2018   Chronic left hip pain 12/04/2017   Obesity, Class II, BMI 35-39.9 12/04/2017   Abnormal finding on MRI of brain 11/05/2017   Mild major depression 09/11/2017   Patellofemoral stress syndrome of both knees 08/09/2014    Social History   Tobacco Use   Smoking status: Never   Smokeless tobacco: Never  Substance Use Topics   Alcohol use: Not Currently    Alcohol/week: 0.0 standard drinks of alcohol    Comment: occasionally     Current Outpatient Medications:    Cholecalciferol (VITAMIN D ) 50 MCG (2000 UT) CAPS, Take 1 capsule by mouth daily., Disp: , Rfl:    fexofenadine (ALLEGRA) 180 MG tablet, Take 180 mg by mouth daily., Disp: , Rfl:    fluticasone  (FLONASE ) 50 MCG/ACT nasal spray, Place 2 sprays into both nostrils daily. (Patient taking differently: Place 2 sprays into both nostrils daily as needed.), Disp: 48 g, Rfl: 1   hydrochlorothiazide  (HYDRODIURIL ) 12.5 MG tablet, Take 1 tablet (12.5 mg total) by mouth daily., Disp: 90 tablet, Rfl: 1   Multiple Vitamin (MULTIVITAMIN WITH MINERALS) TABS tablet, Take 1 tablet by mouth daily., Disp: , Rfl:    Probiotic Product (UP4 PROBIOTICS WOMENS  PO), Take by mouth., Disp: , Rfl:    celecoxib  (CELEBREX ) 100 MG capsule, Take 1 capsule (100 mg total) by mouth 2 (two) times daily as needed. (Patient not taking: Reported on 06/20/2024), Disp: 180 capsule, Rfl: 0   metFORMIN (GLUCOPHAGE-XR) 500 MG 24 hr tablet, Take 1 tablet (500 mg total) by mouth daily with breakfast. (Patient not taking: Reported on 06/20/2024), Disp: 90 tablet, Rfl: 1   metoprolol   succinate (TOPROL -XL) 50 MG 24 hr tablet, Take 1 tablet (50 mg total) by mouth daily. Take with or immediately following a meal. (Patient not taking: Reported on 06/20/2024), Disp: 90 tablet, Rfl: 0   Na Sulfate-K Sulfate-Mg Sulfate concentrate (SUPREP) 17.5-3.13-1.6 GM/177ML SOLN, , Disp: , Rfl:   Allergies  Allergen Reactions   Pantoprazole      Pain in lef,t arm numbness and trouble moving arm     ROS  Ten systems reviewed and is negative except as mentioned in HPI    Objective  Vitals:   06/20/24 1407  BP: 122/78  Pulse: 81  Resp: 16  SpO2: 100%  Weight: 201 lb 9.6 oz (91.4 kg)  Height: 5' (1.524 m)    Body mass index is 39.37 kg/m.   Physical Exam CONSTITUTIONAL: Patient appears well-developed and well-nourished. No distress. HEENT: Head atraumatic, normocephalic, neck supple. CARDIOVASCULAR: Normal rate, regular rhythm, normal heart sounds, and normal pulse. No murmur heard. No BLE edema. PULMONARY: Effort normal and breath sounds normal. No respiratory distress. ABDOMINAL: There is no tenderness or distention. MUSCULOSKELETAL: Normal gait. Without gross motor or sensory deficit. PSYCHIATRIC: Patient has a normal mood and affect. Behavior is normal. Judgment and thought content normal.  Recent Results (from the past 2160 hours)  Cervicovaginal ancillary only     Status: Abnormal   Collection Time: 03/31/24  1:37 PM  Result Value Ref Range   Neisseria Gonorrhea Negative    Chlamydia Negative    Trichomonas Negative    Bacterial Vaginitis (gardnerella) Positive (A)    Candida Vaginitis Negative    Candida Glabrata Negative    Comment      Normal Reference Range Bacterial Vaginosis - Negative   Comment Normal Reference Ranger Chlamydia - Negative    Comment      Normal Reference Range Neisseria Gonorrhea - Negative   Comment Normal Reference Range Candida Species - Negative    Comment Normal Reference Range Candida Galbrata - Negative    Comment Normal  Reference Range Trichomonas - Negative   RPR     Status: None   Collection Time: 03/31/24  1:49 PM  Result Value Ref Range   RPR Ser Ql NON-REACTIVE NON-REACTIVE    Comment: . No laboratory evidence of syphilis. If recent exposure is suspected, submit a new sample in 2-4 weeks. .   Hepatitis C Antibody     Status: None   Collection Time: 03/31/24  1:49 PM  Result Value Ref Range   Hepatitis C Ab NON-REACTIVE NON-REACTIVE    Comment: . HCV antibody was non-reactive. There is no laboratory  evidence of HCV infection. . In most cases, no further action is required. However, if recent HCV exposure is suspected, a test for HCV RNA (test code 64354) is suggested. . For additional information please refer to http://education.questdiagnostics.com/faq/FAQ22v1 (This link is being provided for informational/ educational purposes only.) .   HIV antibody (with reflex)     Status: None   Collection Time: 03/31/24  1:49 PM  Result Value Ref Range   HIV FINAL INTERPRETATION  Comment: HIV Negative . HIV-1 antigen and HIV-1/HIV-2 antibodies were not detected. There is no laboratory evidence of HIV infection.    HIV 1&2 Ab, 4th Generation NON-REACTIVE NON-REACTIVE    Assessment & Plan Paresthesia, dorsal right foot Intermittent paresthesia on the dorsal right foot for one week. Differential includes nerve compression or inflammation. Prefers to avoid oral medications. - Referred to podiatrist for evaluation and possible x-rays. - Advised on changing shoe lacing technique to alleviate pressure on nerves. - Will consider nerve conduction study if podiatrist evaluation is inconclusive.  Vaginal itching, suspected yeast infection Vaginal itching without discharge, suspected yeast infection. Possible irritant contact dermatitis due to detergent change. Differential includes trichomonas or bacterial vaginosis. Prefers swab testing before treatment. - Ordered swab to test for trichomonas,  bacterial vaginosis, and yeast infection. - Prescribed Diflucan  (fluconazole ) to be taken every other day for three days if yeast infection is confirmed.

## 2024-06-22 ENCOUNTER — Ambulatory Visit: Payer: Self-pay | Admitting: Family Medicine

## 2024-06-22 LAB — CERVICOVAGINAL ANCILLARY ONLY
Bacterial Vaginitis (gardnerella): NEGATIVE
Candida Glabrata: NEGATIVE
Candida Vaginitis: POSITIVE — AB
Chlamydia: NEGATIVE
Comment: NEGATIVE
Comment: NEGATIVE
Comment: NEGATIVE
Comment: NEGATIVE
Comment: NEGATIVE
Comment: NORMAL
Neisseria Gonorrhea: NEGATIVE
Trichomonas: NEGATIVE

## 2024-06-28 ENCOUNTER — Emergency Department

## 2024-06-28 ENCOUNTER — Emergency Department: Admission: EM | Admit: 2024-06-28 | Discharge: 2024-06-29 | Disposition: A | Discharge status: Home / Self Care

## 2024-06-28 ENCOUNTER — Other Ambulatory Visit: Payer: Self-pay

## 2024-06-28 DIAGNOSIS — R091 Pleurisy: Secondary | ICD-10-CM | POA: Insufficient documentation

## 2024-06-28 DIAGNOSIS — M546 Pain in thoracic spine: Secondary | ICD-10-CM | POA: Insufficient documentation

## 2024-06-28 DIAGNOSIS — I10 Essential (primary) hypertension: Secondary | ICD-10-CM | POA: Diagnosis not present

## 2024-06-28 DIAGNOSIS — R079 Chest pain, unspecified: Secondary | ICD-10-CM | POA: Diagnosis not present

## 2024-06-28 DIAGNOSIS — R0789 Other chest pain: Secondary | ICD-10-CM | POA: Diagnosis not present

## 2024-06-28 DIAGNOSIS — E119 Type 2 diabetes mellitus without complications: Secondary | ICD-10-CM | POA: Diagnosis not present

## 2024-06-28 DIAGNOSIS — M549 Dorsalgia, unspecified: Secondary | ICD-10-CM

## 2024-06-28 LAB — CBC
HCT: 41.1 % (ref 36.0–46.0)
Hemoglobin: 13.4 g/dL (ref 12.0–15.0)
MCH: 27.6 pg (ref 26.0–34.0)
MCHC: 32.6 g/dL (ref 30.0–36.0)
MCV: 84.7 fL (ref 80.0–100.0)
Platelets: 264 K/uL (ref 150–400)
RBC: 4.85 MIL/uL (ref 3.87–5.11)
RDW: 13 % (ref 11.5–15.5)
WBC: 5.6 K/uL (ref 4.0–10.5)
nRBC: 0 % (ref 0.0–0.2)

## 2024-06-28 LAB — TROPONIN T, HIGH SENSITIVITY: Troponin T High Sensitivity: <15 ng/L (ref 0–19)

## 2024-06-28 LAB — BASIC METABOLIC PANEL WITH GFR
Anion gap: 14 (ref 5–15)
BUN: 10 mg/dL (ref 6–20)
CO2: 24 mmol/L (ref 22–32)
Calcium: 9.3 mg/dL (ref 8.9–10.3)
Chloride: 101 mmol/L (ref 98–111)
Creatinine, Ser: 0.92 mg/dL (ref 0.44–1.00)
GFR, Estimated: >60 mL/min (ref >60)
Glucose, Bld: 78 mg/dL (ref 70–99)
Potassium: 3.7 mmol/L (ref 3.5–5.1)
Sodium: 139 mmol/L (ref 135–145)

## 2024-06-28 LAB — POC URINE PREG, ED: Preg Test, Ur: NEGATIVE

## 2024-06-28 MED ORDER — IBUPROFEN 600 MG PO TABS
600.0000 mg | ORAL_TABLET | Freq: Once | ORAL | Status: AC
  Administered 2024-06-28: 600 mg via ORAL
  Filled 2024-06-28: qty 1

## 2024-06-28 MED ORDER — LIDOCAINE 5 % EX PTCH
1.0000 | MEDICATED_PATCH | CUTANEOUS | Status: DC
  Administered 2024-06-28: 1 via TRANSDERMAL
  Filled 2024-06-28: qty 1

## 2024-06-28 NOTE — ED Provider Notes (Signed)
 [facility] Provider Note    Event Date/Time   First MD Initiated Contact with Patient 06/28/24 2256     (approximate)   History   Shortness of Breath and Chest Pain  Pt reports she developed chest pain shortness of breath yesterday, pt states when she takes a deep breath she is having upper back pain.    HPI Bethany [name] is a 43 y.o. female MH hypertension, diabetes, mitral valve prolapse, OSA presents for evaluation of back/chest discomfort, shortness of breath - patient has had R upper back pain near her shoulder blade since yesterday, gradually worsening.  Pleuritic in nature.  Does make her feel somewhat short of breath.  Has had mild congestion recently and has been sneezing, denies any frank cough, no hemoptysis. -Notes that she has some tenderness on her anterior chest wall when pushing on it though no pain outside of pushing on it.  No exertional symptoms. 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  Took Tylenol earlier this evening with minimal relief.  Is following to [facility]. -No history of DVT/PE, no recent surgery/stasis/travel other than a short flight to [place] last month.  No hormonal therapies.  No leg swelling or pain. -No abdominal pain     Physical Exam   Triage Vital Signs: ED Triage Vitals  Encounter Vitals Group     BP 06/28/24 2050 134/88     Girls Systolic BP Percentile --      Girls Diastolic BP Percentile --      Boys Systolic BP Percentile --      Boys Diastolic BP Percentile --      Pulse Rate 06/28/24 2050 84     Resp 06/28/24 2050 17     Temp 06/28/24 2050 99.2 F (37.3 C)     Temp Source 06/28/24 2050 Oral     SpO2 06/28/24 2050 100 %     Weight 06/28/24 2048 195 lb (88.5 kg)     Height 06/28/24 2048 5' (1.524 m)     Head Circumference --      Peak Flow --      Pain Score 06/28/24 2048 6     Pain Loc --      Pain Education --      Exclude from Growth Chart --     Most recent  vital signs: Vitals:   06/28/24 2050  BP: 134/88  Pulse: 84  Resp: 17  Temp: 99.2 F (37.3 C)  SpO2: 100%     General: Awake, no distress.  CV:  Good peripheral perfusion. RRR, RP 2+ Resp:  Normal effort. CTAB Abd:  No distention. Nontender to deep palpation throughout Other:  Tender to palpation just medial to right scapula and over mid anterior chest wall bilaterally.  Some pain with ranging of right shoulder.   ED Results / Procedures / Treatments   Labs (all labs ordered are listed, but only abnormal results are displayed) Labs Reviewed  BASIC METABOLIC PANEL WITH GFR  CBC  POC URINE PREG, ED  TROPONIN T, HIGH SENSITIVITY  TROPONIN T, HIGH SENSITIVITY     EKG  See ED course below   RADIOLOGY Radiology interpreted by myself radiology report reviewed.  No acute pathology identified.    PROCEDURES:  Critical Care performed: {CriticalCareYesNo:19197::Yes, see critical care procedure note(s),No}  Procedures   MEDICATIONS ORDERED IN ED: Medications - No data to display   IMPRESSION / MDM / ASSESSMENT AND PLAN / ED COURSE  I reviewed the triage vital signs and the nursing notes.                              DDX/MDM/AP: Differential diagnosis includes, but is not limited to, likely MSK pain, consider ACS, PE.  Do not suspect intra-abdominal pathology at this time.  Considered but doubt pneumothorax.  Do not clinically suspect aortic dissection.  Consider underlying pneumonia, pleurisy.  Plan: - Labs - EKG - Chest x-ray - Motrin, Lidoderm patch  Patient's presentation is most consistent with acute presentation with potential threat to life or bodily function.  The patient is on the cardiac monitor to evaluate for evidence of arrhythmia and/or significant heart rate changes.  ED course below. ***  Clinical Course as of 06/28/24 2258  Tue Jun 28, 2024  2256 CBC, BMP reviewed, unremarkable  hCG negative  Troponin normal [MM]  2257  CXR: IMPRESSION: No active cardiopulmonary disease.   [MM]  2257 Ecg = sinus rhythm, rate 80, no gross ST elevation or depression, no significant repolarization abnormality, normal axis, normal intervals.  No clear evidence of ischemia nor arrhythmia interpretation. [MM]    Clinical Course User Index [MM] Clarine Ozell LABOR, MD     FINAL CLINICAL IMPRESSION(S) / ED DIAGNOSES   Final diagnoses:  None     Rx / DC Orders   ED Discharge Orders     None        Note:  This document was prepared using Dragon voice recognition software and may include unintentional dictation errors.

## 2024-06-28 NOTE — ED Triage Notes (Signed)
 Pt reports she developed chest pain shortness of breath yesterday, pt states when she takes a deep breath she is having upper back pain.

## 2024-06-29 LAB — D-DIMER, QUANTITATIVE: D-Dimer, Quant: 0.27 ug{FEU}/mL (ref 0.00–0.50)

## 2024-06-29 LAB — TROPONIN T, HIGH SENSITIVITY: Troponin T High Sensitivity: <15 ng/L (ref 0–19)

## 2024-06-29 MED ORDER — LIDOCAINE 5 % EX PTCH: 1.0000 | MEDICATED_PATCH | CUTANEOUS | 0 refills | Status: AC

## 2024-06-29 MED ORDER — IBUPROFEN 200 MG PO TABS: 600.0000 mg | ORAL_TABLET | Freq: Three times a day (TID) | ORAL | 0 refills | Status: AC | PRN

## 2024-06-29 MED ORDER — ACETAMINOPHEN 500 MG PO TABS: 1000.0000 mg | ORAL_TABLET | Freq: Four times a day (QID) | ORAL | 2 refills | Status: AC | PRN

## 2024-06-29 NOTE — Discharge Instructions (Signed)
 Your evaluation in the emergency department was overall reassuring.  I suspect you likely have muscular strain of your chest and back while, and you can continue with Tylenol and Lidoderm patches as needed for any ongoing discomfort.  Please do follow-up with your primary care provider for reevaluation, and return to the emergency department with any new or worsening symptoms.

## 2024-07-18 DIAGNOSIS — F4310 Post-traumatic stress disorder, unspecified: Secondary | ICD-10-CM | POA: Diagnosis not present

## 2024-07-28 ENCOUNTER — Ambulatory Visit: Admitting: Sleep Medicine

## 2024-07-28 ENCOUNTER — Ambulatory Visit

## 2024-07-28 VITALS — BP 112/80 | HR 71 | Temp 98.4°F | Ht 60.0 in | Wt 203.0 lb

## 2024-07-28 DIAGNOSIS — Z6839 Body mass index (BMI) 39.0-39.9, adult: Secondary | ICD-10-CM | POA: Diagnosis not present

## 2024-07-28 DIAGNOSIS — G4733 Obstructive sleep apnea (adult) (pediatric): Secondary | ICD-10-CM

## 2024-07-28 DIAGNOSIS — I10 Essential (primary) hypertension: Secondary | ICD-10-CM | POA: Diagnosis not present

## 2024-07-28 DIAGNOSIS — E669 Obesity, unspecified: Secondary | ICD-10-CM | POA: Diagnosis not present

## 2024-07-28 NOTE — Progress Notes (Signed)
 Name:Bethany [name] MRN: [id number] DOB: Sep 25, 1980   CHIEF COMPLAINT:  CPAP F/U   HISTORY OF PRESENT ILLNESS: Bethany [name] is a 43 y.o. w/ a h/o OSA, HTN and morbid obesity who presents for CPAP F/U visit. Reports decreased CPAP usage due to moving recently. She is currently using the Airfit N30i nasal mask, which is comfortable. Reports feeling significantly more refreshed upon awakening with CPAP therapy.   Reports significant weight gain over the last few months since discontinuing Zepbound . States that she would like to go back on Zepbound  for weight loss.    EPWORTH SLEEP SCORE    01/19/2024    2:00 PM 12/01/2023    9:00 AM  Results of the Epworth flowsheet  Sitting and reading 0 1  Watching TV 1 2  Sitting, inactive in a public place (e.g. a theatre or a meeting) 1 0  As a passenger in a car for an hour without a break 1 1  Lying down to rest in the afternoon when circumstances permit 2 3  Sitting and talking to someone 0 2  Sitting quietly after a lunch without alcohol 1 3  In a car, while stopped for a few minutes in traffic 0 0  Total score 6 12     PAST MEDICAL HISTORY :   has a past medical history of Diabetes mellitus without complication (HCC) and Hypertension.  has a past surgical history that includes Cesarean section (2002) and Cesarean section (2005). Prior to Admission medications   Medication Sig Start Date End Date Taking? Authorizing Provider  celecoxib  (CELEBREX ) 100 MG capsule Take 1 capsule (100 mg total) by mouth 2 (two) times daily as needed. 06/04/23  Yes Sowles, Krichna, MD  Cholecalciferol (VITAMIN D ) 50 MCG (2000 UT) CAPS Take 1 capsule by mouth daily.   Yes [provider]  fexofenadine (ALLEGRA) 180 MG tablet Take 180 mg by mouth daily.   Yes [provider]  fluconazole  (DIFLUCAN ) 150 MG tablet Take 1 tablet (150 mg total) by mouth every 3 (three) days as needed (for vaginal itching/yeast infection sx). 04/05/24  Yes  Bethany Mliss FALCON, FNP  fluticasone  (FLONASE ) 50 MCG/ACT nasal spray Place 2 sprays into both nostrils daily. Patient taking differently: Place 2 sprays into both nostrils daily as needed. 12/01/22  Yes Sowles, Krichna, MD  hydrochlorothiazide  (HYDRODIURIL ) 12.5 MG tablet Take 1 tablet (12.5 mg total) by mouth daily. 12/01/23  Yes Sowles, Krichna, MD  metoprolol  succinate (TOPROL -XL) 50 MG 24 hr tablet Take 1 tablet (50 mg total) by mouth daily. Take with or immediately following a meal. 11/12/23  Yes Sowles, Krichna, MD  Multiple Vitamin (MULTIVITAMIN WITH MINERALS) TABS tablet Take 1 tablet by mouth daily.   Yes [provider]  Probiotic Product (UP4 PROBIOTICS WOMENS PO) Take by mouth.   Yes [provider]   Allergies  Allergen Reactions   Pantoprazole      Pain in lef,t arm numbness and trouble moving arm     FAMILY HISTORY:  family history includes Breast cancer in her maternal aunt; Clotting disorder in her mother; Diabetes Mellitus II in her father; Emphysema in her maternal grandfather and mother; Heart Problems in her father; Heart failure in her maternal grandmother; Hypercalcemia in her mother; Hypertension in her father and mother; Kidney disease in her paternal aunt; Lung cancer in her maternal aunt and maternal uncle; Prostate cancer in her father; Seizures in her half-sister; Sickle cell trait in her mother; Stroke in her  maternal grandmother; Thyroid  disease in her half-sister. SOCIAL HISTORY:  reports that she has never smoked. She has never used smokeless tobacco. She reports that she does not currently use alcohol. She reports that she does not use drugs.   Review of Systems:  Gen:  Denies  fever, sweats, chills weight loss  HEENT: Denies blurred vision, double vision, ear pain, eye pain, hearing loss, nose bleeds, sore throat Cardiac:  No dizziness, chest pain or heaviness, chest tightness,edema, No JVD Resp:   No cough, -sputum production, -shortness of  breath,-wheezing, -hemoptysis,  Gi: Denies swallowing difficulty, stomach pain, nausea or vomiting, diarrhea, constipation, bowel incontinence Gu:  Denies bladder incontinence, burning urine Ext:   Denies Joint pain, stiffness or swelling Skin: Denies  skin rash, easy bruising or bleeding or hives Endoc:  Denies polyuria, polydipsia , polyphagia or weight change Psych:   Denies depression, insomnia or hallucinations  Other:  All other systems negative  VITAL SIGNS: BP 112/80   Pulse 71   Temp 98.4 F (36.9 C)   Ht 5' (1.524 m)   Wt 203 lb (92.1 kg)   LMP  (LMP Unknown)   SpO2 97%   BMI 39.65 kg/m    Physical Examination:   General Appearance: No distress  EYES PERRLA, EOM intact.   NECK Supple, No JVD Pulmonary: normal breath sounds, No wheezing.  CardiovascularNormal S1,S2.  No m/r/g.   Abdomen: Benign, Soft, non-tender. Skin:   warm, no rashes, no ecchymosis  Extremities: normal, no cyanosis, clubbing. Neuro:without focal findings,  speech normal  PSYCHIATRIC: Mood, affect within normal limits.   ASSESSMENT AND PLAN  OSA Due to significant weight gain, will reassess apnea with HST. Counseled patient on increasing CPAP compliance. Discussed the consequences of untreated sleep apnea. Advised not to drive drowsy for safety of patient and others. Will follow up in 3 months.     HTN Stable, on current management. Following with PCP.   Obesity Counseled patient on diet and lifestyle modification.    Patient  satisfied with Plan of action and management. All questions answered  I spent a total of 33 minutes reviewing chart data, face-to-face evaluation with the patient, counseling and coordination of care as detailed above.    Shaquita Fort, M.D.  Sleep Medicine [facility]

## 2024-08-01 ENCOUNTER — Ambulatory Visit: Admitting: Sleep Medicine

## 2024-08-02 DIAGNOSIS — F4310 Post-traumatic stress disorder, unspecified: Secondary | ICD-10-CM | POA: Diagnosis not present

## 2024-08-04 ENCOUNTER — Encounter: Payer: Self-pay | Admitting: Sleep Medicine

## 2024-08-12 ENCOUNTER — Ambulatory Visit: Admitting: Podiatry

## 2024-08-12 NOTE — Telephone Encounter (Signed)
 Cardiac clearance granted from [facility] on 06/16/24 from Dr. Luevenia.  Thanks,  [place], CMA

## 2024-08-25 ENCOUNTER — Encounter

## 2024-08-25 DIAGNOSIS — G4733 Obstructive sleep apnea (adult) (pediatric): Secondary | ICD-10-CM

## 2024-09-01 NOTE — Telephone Encounter (Signed)
 SNAP website states processing test

## 2024-09-07 DIAGNOSIS — G4733 Obstructive sleep apnea (adult) (pediatric): Secondary | ICD-10-CM | POA: Diagnosis not present

## 2024-09-09 ENCOUNTER — Ambulatory Visit: Payer: Self-pay

## 2024-09-13 NOTE — Telephone Encounter (Signed)
 See results message from 1/30.  Closing this encounter.

## 2024-09-14 NOTE — Telephone Encounter (Signed)
 Noted. Nothing further needed.

## 2024-09-20 ENCOUNTER — Ambulatory Visit: Admission: RE | Admit: 2024-09-20 | Source: Home / Self Care | Admitting: Gastroenterology

## 2024-09-20 ENCOUNTER — Encounter: Admission: RE | Payer: Self-pay | Source: Home / Self Care

## 2024-10-26 ENCOUNTER — Ambulatory Visit: Admitting: Sleep Medicine

## 2024-12-08 ENCOUNTER — Ambulatory Visit: Admitting: Family Medicine
